# Patient Record
Sex: Male | Born: 1935 | Race: Black or African American | Hispanic: No | Marital: Married | Smoking: Never smoker
Health system: Southern US, Community
[De-identification: ages and names within clinical notes are randomized; demographics above are authoritative.]

## PROBLEM LIST (undated history)

## (undated) DIAGNOSIS — I10 Essential (primary) hypertension: Secondary | ICD-10-CM

## (undated) DIAGNOSIS — E7800 Pure hypercholesterolemia, unspecified: Secondary | ICD-10-CM

## (undated) DIAGNOSIS — E785 Hyperlipidemia, unspecified: Secondary | ICD-10-CM

## (undated) DIAGNOSIS — I82401 Acute embolism and thrombosis of unspecified deep veins of right lower extremity: Secondary | ICD-10-CM

## (undated) DIAGNOSIS — C801 Malignant (primary) neoplasm, unspecified: Secondary | ICD-10-CM

## (undated) HISTORY — PX: JOINT REPLACEMENT: SHX530

---

## 2001-05-18 ENCOUNTER — Ambulatory Visit (HOSPITAL_COMMUNITY): Admission: RE | Admit: 2001-05-18 | Discharge: 2001-05-18 | Payer: Self-pay | Admitting: Internal Medicine

## 2002-10-02 ENCOUNTER — Encounter: Admission: RE | Admit: 2002-10-02 | Discharge: 2002-10-02 | Payer: Self-pay | Admitting: Orthopedic Surgery

## 2002-10-02 ENCOUNTER — Encounter: Payer: Self-pay | Admitting: Orthopedic Surgery

## 2002-10-04 ENCOUNTER — Ambulatory Visit (HOSPITAL_BASED_OUTPATIENT_CLINIC_OR_DEPARTMENT_OTHER): Admission: RE | Admit: 2002-10-04 | Discharge: 2002-10-04 | Payer: Self-pay | Admitting: Orthopedic Surgery

## 2003-12-04 ENCOUNTER — Ambulatory Visit (HOSPITAL_COMMUNITY): Admission: RE | Admit: 2003-12-04 | Discharge: 2003-12-04 | Payer: Self-pay | Admitting: Gastroenterology

## 2003-12-04 ENCOUNTER — Encounter (INDEPENDENT_AMBULATORY_CARE_PROVIDER_SITE_OTHER): Payer: Self-pay | Admitting: Specialist

## 2006-02-02 ENCOUNTER — Encounter: Admission: RE | Admit: 2006-02-02 | Discharge: 2006-02-02 | Payer: Self-pay | Admitting: Sports Medicine

## 2006-06-13 ENCOUNTER — Emergency Department (HOSPITAL_COMMUNITY): Admission: EM | Admit: 2006-06-13 | Discharge: 2006-06-13 | Payer: Self-pay | Admitting: Emergency Medicine

## 2006-08-15 ENCOUNTER — Encounter: Admission: RE | Admit: 2006-08-15 | Discharge: 2006-08-15 | Payer: Self-pay | Admitting: Sports Medicine

## 2006-10-25 ENCOUNTER — Ambulatory Visit (HOSPITAL_COMMUNITY): Admission: RE | Admit: 2006-10-25 | Discharge: 2006-10-25 | Payer: Self-pay | Admitting: General Surgery

## 2006-10-31 ENCOUNTER — Emergency Department (HOSPITAL_COMMUNITY): Admission: EM | Admit: 2006-10-31 | Discharge: 2006-10-31 | Payer: Self-pay | Admitting: Emergency Medicine

## 2007-09-07 ENCOUNTER — Encounter: Admission: RE | Admit: 2007-09-07 | Discharge: 2007-09-07 | Payer: Self-pay | Admitting: Sports Medicine

## 2008-06-10 ENCOUNTER — Encounter: Admission: RE | Admit: 2008-06-10 | Discharge: 2008-06-10 | Payer: Self-pay | Admitting: Sports Medicine

## 2008-10-07 ENCOUNTER — Encounter: Admission: RE | Admit: 2008-10-07 | Discharge: 2008-10-07 | Payer: Self-pay | Admitting: Sports Medicine

## 2009-02-04 ENCOUNTER — Encounter: Admission: RE | Admit: 2009-02-04 | Discharge: 2009-02-04 | Payer: Self-pay | Admitting: Sports Medicine

## 2009-07-08 ENCOUNTER — Ambulatory Visit: Payer: Self-pay

## 2009-07-08 ENCOUNTER — Encounter: Payer: Self-pay | Admitting: Cardiovascular Disease

## 2009-07-08 DIAGNOSIS — Z8672 Personal history of thrombophlebitis: Secondary | ICD-10-CM

## 2009-07-16 ENCOUNTER — Inpatient Hospital Stay (HOSPITAL_COMMUNITY): Admission: RE | Admit: 2009-07-16 | Discharge: 2009-07-19 | Payer: Self-pay | Admitting: Orthopedic Surgery

## 2010-02-24 NOTE — Miscellaneous (Signed)
Summary: Orders Update  Clinical Lists Changes  Problems: Added new problem of DEEP VENOUS THROMBOPHLEBITIS, HX OF (ICD-V12.52) Orders: Added new Test order of Carotid Duplex (Carotid Duplex) - Signed Added new Test order of Venous Duplex Lower Extremity (Venous Duplex Lower) - Signed

## 2010-04-12 LAB — BASIC METABOLIC PANEL
BUN: 6 mg/dL (ref 6–23)
BUN: 7 mg/dL (ref 6–23)
BUN: 8 mg/dL (ref 6–23)
BUN: 8 mg/dL (ref 6–23)
CO2: 31 mEq/L (ref 19–32)
CO2: 31 mEq/L (ref 19–32)
CO2: 33 mEq/L — ABNORMAL HIGH (ref 19–32)
CO2: 33 mEq/L — ABNORMAL HIGH (ref 19–32)
Calcium: 8.4 mg/dL (ref 8.4–10.5)
Chloride: 95 mEq/L — ABNORMAL LOW (ref 96–112)
Chloride: 98 mEq/L (ref 96–112)
Chloride: 98 mEq/L (ref 96–112)
Creatinine, Ser: 0.93 mg/dL (ref 0.4–1.5)
Creatinine, Ser: 1.04 mg/dL (ref 0.4–1.5)
GFR calc Af Amer: >60 mL/min (ref >60)
GFR calc non Af Amer: >60 mL/min (ref >60)
GFR calc non Af Amer: >60 mL/min (ref >60)
Glucose, Bld: 125 mg/dL — ABNORMAL HIGH (ref 70–99)
Glucose, Bld: 138 mg/dL — ABNORMAL HIGH (ref 70–99)
Glucose, Bld: 142 mg/dL — ABNORMAL HIGH (ref 70–99)
Glucose, Bld: 166 mg/dL — ABNORMAL HIGH (ref 70–99)
Potassium: 2.9 mEq/L — ABNORMAL LOW (ref 3.5–5.1)
Potassium: 3 mEq/L — ABNORMAL LOW (ref 3.5–5.1)
Potassium: 3.1 mEq/L — ABNORMAL LOW (ref 3.5–5.1)
Sodium: 135 mEq/L (ref 135–145)
Sodium: 137 mEq/L (ref 135–145)
Sodium: 140 mEq/L (ref 135–145)

## 2010-04-12 LAB — COMPREHENSIVE METABOLIC PANEL
AST: 30 U/L (ref 0–37)
Albumin: 4.1 g/dL (ref 3.5–5.2)
Alkaline Phosphatase: 59 U/L (ref 39–117)
BUN: 15 mg/dL (ref 6–23)
CO2: 31 mEq/L (ref 19–32)
Creatinine, Ser: 0.98 mg/dL (ref 0.4–1.5)
GFR calc non Af Amer: >60 mL/min (ref >60)
Potassium: 4 mEq/L (ref 3.5–5.1)
Sodium: 140 mEq/L (ref 135–145)
Total Bilirubin: 0.6 mg/dL (ref 0.3–1.2)

## 2010-04-12 LAB — PROTIME-INR
INR: 1.03 (ref 0.00–1.49)
INR: 1.23 (ref 0.00–1.49)
INR: 1.49 (ref 0.00–1.49)
Prothrombin Time: 15.3 seconds — ABNORMAL HIGH (ref 11.6–15.2)
Prothrombin Time: 15.4 seconds — ABNORMAL HIGH (ref 11.6–15.2)

## 2010-04-12 LAB — CBC
HCT: 31.7 % — ABNORMAL LOW (ref 39.0–52.0)
HCT: 32.8 % — ABNORMAL LOW (ref 39.0–52.0)
HCT: 46.7 % (ref 39.0–52.0)
Hemoglobin: 10.5 g/dL — ABNORMAL LOW (ref 13.0–17.0)
Hemoglobin: 10.9 g/dL — ABNORMAL LOW (ref 13.0–17.0)
MCH: 28.8 pg (ref 26.0–34.0)
MCHC: 33.2 g/dL (ref 30.0–36.0)
MCHC: 33.3 g/dL (ref 30.0–36.0)
MCV: 86.4 fL (ref 78.0–100.0)
MCV: 87.2 fL (ref 78.0–100.0)
Platelets: 137 10*3/uL — ABNORMAL LOW (ref 150–400)
Platelets: 169 10*3/uL (ref 150–400)
RDW: 13.2 % (ref 11.5–15.5)
RDW: 13.5 % (ref 11.5–15.5)
RDW: 13.5 % (ref 11.5–15.5)
RDW: 13.5 % (ref 11.5–15.5)
WBC: 5.9 10*3/uL (ref 4.0–10.5)
WBC: 8.4 10*3/uL (ref 4.0–10.5)

## 2010-04-12 LAB — URINALYSIS, ROUTINE W REFLEX MICROSCOPIC
Glucose, UA: NEGATIVE mg/dL
Hgb urine dipstick: NEGATIVE
Protein, ur: NEGATIVE mg/dL
Urobilinogen, UA: 1 mg/dL (ref 0.0–1.0)

## 2010-04-12 LAB — TYPE AND SCREEN
ABO/RH(D): A NEG
Antibody Screen: NEGATIVE

## 2010-04-12 LAB — APTT: aPTT: 28 seconds (ref 24–37)

## 2010-06-09 NOTE — Procedures (Signed)
NAME:  Craig Freeman, Craig Freeman              ACCOUNT NO.:  [id number]   MEDICAL RECORD NO.:  [id number]          PATIENT TYPE:  INP   LOCATION:  5016                         FACILITY:  [facility]   PHYSICIAN:  Loreta Ave, M.D. DATE OF BIRTH:  1924-11-07   DATE OF PROCEDURE:  DATE OF DISCHARGE:                    PERIPHERAL VASCULAR INVASIVE PROCEDURE   FINAL DIAGNOSES:  1. 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  2. Hypertension.  3. Dyslipidemia.  4. History of chronic Coumadin due to previous deep venous thrombosis      and phlebitis.  5. Asthma.  6. Insomnia.   HISTORY OF PRESENT ILLNESS:  An 77 year old black male with history of  end-stage degenerative joint disease left hip and chronic pain presented  to our office for preop evaluation for total hip replacement.  He had  progressively worsening pain with failed response with conservative  treatment, significant decrease in his daily activities due to the  ongoing complaint.   HOSPITAL COURSE:  On July 16, 2009, the patient was taken to the [facility] OR and a left total hip replacement procedure performed.  Surgeon  Loreta Ave, M.D. and assistant Genene Churn. Denton Meek.  Anesthesia  general.  No specimens.  EBL minimal.  There were no surgical or  anesthesia complications and the patient was transferred to recovery in  stable condition.  Pharmacy protocol Coumadin started along with Lovenox  bridge.  On July 17, 2009, the patient doing well.  Temperature 99.5,  pulse 82, respirations 18, blood pressure 105/67.  WBC 8.4, hematocrit  38.4, hemoglobin 12.6, platelets 137, sodium 135, potassium 3.3,  chloride 95, CO2 31, BUN 8, creatinine 1.04, glucose 166, INR 1.23.  The  patient was alert and oriented.  Dressing clean, dry and intact.  Calf  nontender.  Neurovascularly intact.  Skin warm and dry.  Will have case  manager arrange skilled nursing facility placement since he does live  alone.  The patient is requesting [facility] and he did visit this  facility preop.  Discontinued Dilaudid IV.  PT/OT consults.  On July 18, 2009, the patient doing well.  Pain controlled.  Denies chest pain,  shortness of breath.  Temperature 98.6, pulse 75, respirations 20, blood  pressure 121/71.  WBC 10.9, hematocrit 32.8, hemoglobin 10.9, platelets  112, sodium 140, potassium 2.9, chloride 98, CO2 33, BUN 8, creatinine  0.93, glucose 125, INR 1.49.  Hip wound looks good.  Staples intact.  No  drainage or signs of infection.  Calf nontender.  Neurovascularly  intact.  Skin warm and dry.  For hypokalemia give KCl 40 mEq p.o. q.4  hours times 2 doses only.  Repeat BMET later today.  He is ready for  transfer to [facility] skilled nursing facility Saturday.   DISCHARGE MEDICATIONS:  1. Percocet 5/325 one or two tabs p.o. q.6-8 hours p.r.n. for pain.  2. Robaxin 500 mg one tab p.o. q.6 hours p.r.n. for spasms.  3. Coumadin pharmacy protocol.  Maintain INR 2 to 3.  4. Lovenox 40 mg one subcutaneous injection daily.  Discontinue when  Coumadin is therapeutic with INR 2 to 3.  5. Artificial Tears one drop each eye daily.  6. Proscar 5 mg p.o. daily.  7. Zocor 40 mg p.o. daily.  8. Miconazole 2% cream apply b.i.d. to affected area.  9. Advair 250/50 one puff daily.  10.Colace 100 mg p.o. b.i.d.  11.Tenormin 50 mg p.o. daily.  12.HCTZ 25 mg p.o. daily.  13.Triamcinolone acetonide 0.1% cream apply b.i.d. p.r.n. to affected      area.  14.Albuterol 90 mcg inhaler one puff q.6 hours p.r.n.  15.MiraLAX 17 g powder p.o. q.h.s. p.r.n.   DISPOSITION:  Transfer to [facility] skilled nursing facility.   CONDITION:  Stable.   DISCHARGE INSTRUCTIONS:  While at the facility the patient will continue  to work with PT/OT for hip range of motion and strengthening per  protocol.  He is weightbearing as tolerated with walker.  Daily dressing  changes with 4x4s, gauze and tape.  Do not  apply any creams or ointments  to his incision.  Staples to be removed 2 weeks postop and this can be  done in our office at followup.  He is okay to shower but no tub  soaking.  Discontinue Lovenox injections when Coumadin is therapeutic  with INR 2 to 3.  He will follow up in the office with Dr. Eulah Pont when  he is 2 weeks postop for recheck.  Call us immediately if there are any  questions or concerns before that time at area code [phone].      Genene Churn. Denton Meek.    ______________________________  Loreta Ave, M.D.    JMO/MEDQ  D:  07/18/2009  T:  07/18/2009  Job:  [id number]

## 2010-06-09 NOTE — Op Note (Signed)
NAME:  Craig Freeman, Craig Freeman              ACCOUNT NO.:  [id number]   MEDICAL RECORD NO.:  [id number]          PATIENT TYPE:  AMB   LOCATION:  SDS                          FACILITY:  [facility]   PHYSICIAN:  Adolph Pollack, M.D.DATE OF BIRTH:  07/06/1924   DATE OF PROCEDURE:  10/25/2006  DATE OF DISCHARGE:                               OPERATIVE REPORT   PREOPERATIVE DIAGNOSIS:  Right inguinal hernia.   POSTOPERATIVE DIAGNOSIS:  Indirect right inguinal hernia.   PROCEDURE:  Right inguinal hernia repair with mesh.   SURGEON:  Adolph Pollack, M.D.   ANESTHESIA:  General by way of LMA plus local (Marcaine).   INDICATIONS:  This is an 77 year old male who suffered a right groin  strain earlier this year.  I saw him in the office and he slowly  improved from this.  Recently, however, he presented back saying he has  now noted a swelling and heard a gurgling sound.  On examination he  definitely now has developed a right inguinal hernia and now he presents  for repair.  We discussed the procedure and the risks preoperatively.   TECHNIQUE:  He was seen in the holding area and the right groin marked  with my initials.  He was then brought to the operating room, placed on  the operating table, and a general anesthetic was administered.  The  right groin area was sterilely prepped and draped.  Dilute Marcaine  solution was infiltrated into the right groin.  The right groin incision  was made through the skin, subcutaneous tissue and Scarpa fascia until  the external oblique aponeurosis was exposed.  Local anesthetic was  infiltrated deep to the external oblique aponeurosis.  An incision was  made in the external oblique aponeurosis through the external ring  medially and up toward the anterior superior iliac spine laterally.  Using blunt dissection, the shelving edge of the inguinal ligament was  exposed inferiorly and then the internal oblique muscle and aponeurosis  were exposed superiorly.   I isolated the spermatic cord and created a  window around it and placed a Penrose drain around it.  Posterior  dissection freeing up the cord was performed.  An indirect sac was noted  and this was stripped free from the cord and reduced back through a  dilated internal ring.  Following this a piece of 3 x 6 inches  polypropylene mesh was brought into the field and anchored 2 cm medial  to the pubic tubercle with 2-0 Prolene suture.  The inferior aspect of  the mesh was then anchored to the shelving edge of the inguinal ligament  with a running 2-0 Prolene suture up to a level 2 cm lateral to the  internal ring.  A slit was cut in the mesh creating two tails and these  were wrapped around the spermatic cord.  The superior aspect of the mesh  was anchored to the internal oblique aponeurosis with interrupted 2-0  Vicryl sutures.  The two tails were then crossed creating a new internal  ring.  They were then anchored to the shelving edge of the inguinal  ligament with a single 2-0 Prolene suture.  The tip of the hemostat was  able to be placed through the new aperture.   Following this I inspected the area and hemostasis was adequate.  I then  closed the external oblique aponeurosis over the mesh and cord with a  running 3-0 Vicryl suture.  Scarpa fascia was closed with a running 2-0  Vicryl suture.  Skin was approximated with a 4-0 Monocryl subcuticular  stitch, followed by Steri-Strips and sterile dressings.   He tolerated the procedure well without apparent complications and was  taken to the recovery room in satisfactory condition.      Adolph Pollack, M.D.  Electronically Signed     TJR/MEDQ  D:  10/25/2006  T:  10/25/2006  Job:  [id number]

## 2010-06-12 NOTE — Op Note (Signed)
NAME:  Craig Freeman, Craig Freeman                        ACCOUNT NO.:  [id number]   MEDICAL RECORD NO.:  [id number]                   PATIENT TYPE:  AMB   LOCATION:  DSC                                  FACILITY:  [facility]   PHYSICIAN:  Loreta Ave, M.D.              DATE OF BIRTH:  April 30, 1924   DATE OF PROCEDURE:  10/04/2002  DATE OF DISCHARGE:                                 OPERATIVE REPORT   PREOPERATIVE DIAGNOSES:  Degenerative arthritis, medial meniscus tear, right  knee.   POSTOPERATIVE DIAGNOSES:  1. Degenerative arthritis, grade 3 diffuse, with grade 3 chondromalacia of     the patella.  2. Medial and lateral meniscus tear.   PROCEDURES:  1. Right knee examination under anesthesia, arthroscopy, partial medial and     lateral meniscectomy.  2. Chondroplasty of the patellofemoral joint.  3. Chondroplasty of the medial and lateral compartments.   SURGEON:  Loreta Ave, M.D.   ASSISTANT:  Arlys John D. Petrarca, P.A.-C.   ANESTHESIA:  General.   ESTIMATED BLOOD LOSS:  Minimal.   SPECIMENS:  None.   CULTURES:  None.   COMPLICATIONS:  None.   TOURNIQUET:  Not employed.   DESCRIPTION OF PROCEDURE:  The patient brought to the operating room and  placed on the operating table in the supine position.  After adequate  anesthesia had been obtained, right knee examined.  Full motion, good  stability, positive medial McMurray.  Tourniquet and leg holder applied.  Leg prepped and draped in the usual sterile fashion.  Three portals created,  one superolateral, one each medial and lateral peripatellar.  Inflow  catheter introduced, knee distended, arthroscope introduced, knee inspected.  Grade 3 changes of the patellofemoral joint, treated with chondroplasty.  Smooth, stable surface.  Good patellofemoral tracking.  Extensive tearing,  medial meniscus posterior third, taken down to stable rim.  Tapered into  remaining meniscus, salving the anterior half.  The lateral half had  frayed  flap tears of the middle and anterior third, both treated with resection,  tapered in smoothly, retaining a fair amount of meniscus.  Cruciate  ligaments intact.  Diffuse grade 3 changes in the medial compartment,  treated with chondroplasty.  Some focal grade 3 changes, lateral femoral  condyle, also treated with chondroplasty.  At  completion, the entire knee examined.  All surfaces even.  Instruments and  fluid removed.  Portals and knee injected with Marcaine.  Portals closed  with 4-0 nylon.  A sterile compressive dressing applied.  Anesthesia  reversed.  Brought to the recovery room.  Tolerated the surgery well with no  complications.                                               Loreta Ave, M.D.    DFM/MEDQ  D:  10/04/2002  T:  10/05/2002  Job:  [id number]

## 2010-06-12 NOTE — Op Note (Signed)
NAME:  Craig Freeman, Craig Freeman              ACCOUNT NO.:  [id number]   MEDICAL RECORD NO.:  [id number]          PATIENT TYPE:  AMB   LOCATION:  ENDO                         FACILITY:  [facility]   PHYSICIAN:  Danise Edge, M.D.   DATE OF BIRTH:  01-27-1924   DATE OF PROCEDURE:  12/04/2003  DATE OF DISCHARGE:                                 OPERATIVE REPORT   PROCEDURE:  Colonoscopy.   SURGEON:  Danise Edge, M.D.   PREMEDICATION:  Versed 4 mg and Demerol 30 mg.   INDICATIONS FOR PROCEDURE:  Craig Freeman is a 77 year old male born  [birth year].  Craig Freeman has never undergone colorectal polyp  screening.  He is scheduled to undergo his first screening colonoscopy with  polypectomy to prevent colon cancer to date.  He chronically takes Coumadin  to prevent recurrent phlebitis.  He stopped his Coumadin seven days ago and  is currently on parenteral Lovenox.   DESCRIPTION OF PROCEDURE:  After obtaining informed consent, Craig Freeman was  placed in the left lateral decubitus position.  I administered intravenous  Demerol and intravenous Versed to achieve conscious sedation for the  procedure.  The patient's blood pressure, oxygen saturation and cardiac  rhythm were monitored throughout the procedure and documented in the medical  record.   Anal inspection and digital rectal examination were normal.  The prostate  was not nodular.  The Olympus adjustable pediatric colonoscope was  introduced into the rectum and advanced to the cecum.  Colonic preparation  examination today was excellent.   Craig Freeman has universal colonic diverticulosis without diverticulitis or  diverticular stricture formation.   Rectum normal.  Sigmoid colon and descending colon:  At approximately 50 cm  from the anal verge, a 3 mm sessile polyp was removed with electrocautery  snare and an Endoclip applied to the polypectomy site.  A 1 mm sessile polyp  was removed with cold biopsy forceps.  Splenic flexure  normal.  Transverse  colon normal.  Hepatic flexure normal.  Ascending colon, cecum and ileocecal  valve:  From the proximal cecum, a 1 mm sessile polyp was removed with a  cold biopsy forceps.  From the proximal ascending colon, a 1 mm sessile  polyp was removed with the electrocautery snare and an Endoclip applied to  the polypectomy site.   ASSESSMENT:  1.  Universal diverticulosis.  2.  A dimunitive polyp was removed from the proximal cecum and a 2 mm polyp      was removed from the ascending colon.  3.  At 50 cm from the anal verge, a 3 mm polyp was removed with      electrocautery snare and a 1 mm polyp      removed with cold biopsy forceps.  Endoclips were applied at the      polypectomy site of the two polyps I removed with the electrocautery      snare.   RECOMMENDATIONS:  Craig Freeman will resume taking his Coumadin today.      MJ/MEDQ  D:  12/04/2003  T:  12/04/2003  Job:  [id number]   cc:  Tomasa Hose  [address] [place]  [place] [address]  Fax: [phone]

## 2010-11-05 LAB — DIFFERENTIAL
Basophils Absolute: 0
Basophils Absolute: 0
Basophils Relative: 0
Basophils Relative: 0
Eosinophils Absolute: 0.1
Eosinophils Relative: 1
Monocytes Absolute: 0.6
Monocytes Absolute: 0.5
Neutro Abs: 8.8 — ABNORMAL HIGH
Neutro Abs: 3.3
Neutrophils Relative %: 61

## 2010-11-05 LAB — COMPREHENSIVE METABOLIC PANEL
Alkaline Phosphatase: 51
BUN: 14
Chloride: 97
Glucose, Bld: 96
Potassium: 3.8
Total Bilirubin: 1.2

## 2010-11-05 LAB — URINE CULTURE

## 2010-11-05 LAB — CBC
HCT: 47.3
Hemoglobin: 14.2
Hemoglobin: 15.6
MCHC: 33.2
RBC: 5.63
RDW: 14.3 — ABNORMAL HIGH
WBC: 5.5

## 2010-11-05 LAB — URINALYSIS, ROUTINE W REFLEX MICROSCOPIC
Bilirubin Urine: NEGATIVE
Glucose, UA: NEGATIVE
Specific Gravity, Urine: 1.02
pH: 7

## 2010-11-05 LAB — PROTIME-INR: INR: 2.2 — ABNORMAL HIGH

## 2010-11-05 LAB — CULTURE, BLOOD (ROUTINE X 2)
Culture: NO GROWTH
Culture: NO GROWTH

## 2010-11-05 LAB — I-STAT 8, (EC8 V) (CONVERTED LAB)
Chloride: 101
Glucose, Bld: 120 — ABNORMAL HIGH
Potassium: 3.4 — ABNORMAL LOW
pH, Ven: 7.486 — ABNORMAL HIGH

## 2010-11-05 LAB — URINE MICROSCOPIC-ADD ON

## 2010-11-05 LAB — POCT I-STAT CREATININE: Operator id: 294511

## 2011-01-29 DIAGNOSIS — Z7901 Long term (current) use of anticoagulants: Secondary | ICD-10-CM | POA: Diagnosis not present

## 2011-02-09 DIAGNOSIS — L03119 Cellulitis of unspecified part of limb: Secondary | ICD-10-CM | POA: Diagnosis not present

## 2011-02-09 DIAGNOSIS — L02419 Cutaneous abscess of limb, unspecified: Secondary | ICD-10-CM | POA: Diagnosis not present

## 2011-02-26 DIAGNOSIS — Z7901 Long term (current) use of anticoagulants: Secondary | ICD-10-CM | POA: Diagnosis not present

## 2011-03-09 DIAGNOSIS — H40019 Open angle with borderline findings, low risk, unspecified eye: Secondary | ICD-10-CM | POA: Diagnosis not present

## 2011-03-11 DIAGNOSIS — E782 Mixed hyperlipidemia: Secondary | ICD-10-CM | POA: Diagnosis not present

## 2011-03-11 DIAGNOSIS — K219 Gastro-esophageal reflux disease without esophagitis: Secondary | ICD-10-CM | POA: Diagnosis not present

## 2011-03-11 DIAGNOSIS — R7309 Other abnormal glucose: Secondary | ICD-10-CM | POA: Diagnosis not present

## 2011-03-11 DIAGNOSIS — J45909 Unspecified asthma, uncomplicated: Secondary | ICD-10-CM | POA: Diagnosis not present

## 2011-03-11 DIAGNOSIS — K5900 Constipation, unspecified: Secondary | ICD-10-CM | POA: Diagnosis not present

## 2011-03-11 DIAGNOSIS — Z23 Encounter for immunization: Secondary | ICD-10-CM | POA: Diagnosis not present

## 2011-03-11 DIAGNOSIS — N400 Enlarged prostate without lower urinary tract symptoms: Secondary | ICD-10-CM | POA: Diagnosis not present

## 2011-03-11 DIAGNOSIS — I10 Essential (primary) hypertension: Secondary | ICD-10-CM | POA: Diagnosis not present

## 2011-03-11 DIAGNOSIS — Z1331 Encounter for screening for depression: Secondary | ICD-10-CM | POA: Diagnosis not present

## 2011-03-26 DIAGNOSIS — Z7901 Long term (current) use of anticoagulants: Secondary | ICD-10-CM | POA: Diagnosis not present

## 2011-04-26 DIAGNOSIS — Z7901 Long term (current) use of anticoagulants: Secondary | ICD-10-CM | POA: Diagnosis not present

## 2011-04-29 DIAGNOSIS — R6883 Chills (without fever): Secondary | ICD-10-CM | POA: Diagnosis not present

## 2011-05-10 DIAGNOSIS — M25559 Pain in unspecified hip: Secondary | ICD-10-CM | POA: Diagnosis not present

## 2011-05-10 DIAGNOSIS — M47817 Spondylosis without myelopathy or radiculopathy, lumbosacral region: Secondary | ICD-10-CM | POA: Diagnosis not present

## 2011-05-24 DIAGNOSIS — N4889 Other specified disorders of penis: Secondary | ICD-10-CM | POA: Diagnosis not present

## 2011-05-24 DIAGNOSIS — R319 Hematuria, unspecified: Secondary | ICD-10-CM | POA: Diagnosis not present

## 2011-05-24 DIAGNOSIS — Z7901 Long term (current) use of anticoagulants: Secondary | ICD-10-CM | POA: Diagnosis not present

## 2011-06-13 ENCOUNTER — Encounter (HOSPITAL_COMMUNITY): Payer: Self-pay

## 2011-06-13 ENCOUNTER — Emergency Department (HOSPITAL_COMMUNITY): Payer: Medicare Other

## 2011-06-13 ENCOUNTER — Inpatient Hospital Stay (HOSPITAL_COMMUNITY)
Admission: EM | Admit: 2011-06-13 | Discharge: 2011-06-16 | DRG: 603 | Disposition: A | Discharge status: Home Health | Payer: Medicare Other | Attending: Internal Medicine | Admitting: Internal Medicine

## 2011-06-13 DIAGNOSIS — L0291 Cutaneous abscess, unspecified: Secondary | ICD-10-CM

## 2011-06-13 DIAGNOSIS — R5381 Other malaise: Secondary | ICD-10-CM | POA: Diagnosis not present

## 2011-06-13 DIAGNOSIS — Z8672 Personal history of thrombophlebitis: Secondary | ICD-10-CM

## 2011-06-13 DIAGNOSIS — Z9660 Presence of unspecified orthopedic joint implant: Secondary | ICD-10-CM

## 2011-06-13 DIAGNOSIS — I10 Essential (primary) hypertension: Secondary | ICD-10-CM | POA: Diagnosis not present

## 2011-06-13 DIAGNOSIS — E785 Hyperlipidemia, unspecified: Secondary | ICD-10-CM | POA: Diagnosis present

## 2011-06-13 DIAGNOSIS — L0231 Cutaneous abscess of buttock: Secondary | ICD-10-CM | POA: Diagnosis not present

## 2011-06-13 DIAGNOSIS — K219 Gastro-esophageal reflux disease without esophagitis: Secondary | ICD-10-CM | POA: Diagnosis present

## 2011-06-13 DIAGNOSIS — Z7901 Long term (current) use of anticoagulants: Secondary | ICD-10-CM

## 2011-06-13 DIAGNOSIS — J45909 Unspecified asthma, uncomplicated: Secondary | ICD-10-CM | POA: Diagnosis present

## 2011-06-13 DIAGNOSIS — R509 Fever, unspecified: Secondary | ICD-10-CM

## 2011-06-13 DIAGNOSIS — L0390 Cellulitis, unspecified: Secondary | ICD-10-CM

## 2011-06-13 DIAGNOSIS — I809 Phlebitis and thrombophlebitis of unspecified site: Secondary | ICD-10-CM | POA: Diagnosis present

## 2011-06-13 DIAGNOSIS — R5383 Other fatigue: Secondary | ICD-10-CM | POA: Diagnosis not present

## 2011-06-13 DIAGNOSIS — E876 Hypokalemia: Secondary | ICD-10-CM | POA: Diagnosis present

## 2011-06-13 DIAGNOSIS — Z79899 Other long term (current) drug therapy: Secondary | ICD-10-CM

## 2011-06-13 DIAGNOSIS — L03317 Cellulitis of buttock: Secondary | ICD-10-CM | POA: Diagnosis not present

## 2011-06-13 DIAGNOSIS — G4700 Insomnia, unspecified: Secondary | ICD-10-CM | POA: Diagnosis present

## 2011-06-13 DIAGNOSIS — N400 Enlarged prostate without lower urinary tract symptoms: Secondary | ICD-10-CM | POA: Diagnosis present

## 2011-06-13 DIAGNOSIS — R319 Hematuria, unspecified: Secondary | ICD-10-CM | POA: Diagnosis present

## 2011-06-13 DIAGNOSIS — I82401 Acute embolism and thrombosis of unspecified deep veins of right lower extremity: Secondary | ICD-10-CM

## 2011-06-13 HISTORY — DX: Essential (primary) hypertension: I10

## 2011-06-13 HISTORY — DX: Hyperlipidemia, unspecified: E78.5

## 2011-06-13 HISTORY — DX: Acute embolism and thrombosis of unspecified deep veins of right lower extremity: I82.401

## 2011-06-13 LAB — URINALYSIS, ROUTINE W REFLEX MICROSCOPIC
Bilirubin Urine: NEGATIVE
Ketones, ur: NEGATIVE mg/dL
Nitrite: NEGATIVE
Specific Gravity, Urine: 1.023 (ref 1.005–1.030)
Urobilinogen, UA: 1 mg/dL (ref 0.0–1.0)

## 2011-06-13 LAB — POCT I-STAT, CHEM 8
Calcium, Ion: 1.14 mmol/L (ref 1.12–1.32)
Glucose, Bld: 127 mg/dL — ABNORMAL HIGH (ref 70–99)
HCT: 48 % (ref 39.0–52.0)
Hemoglobin: 16.3 g/dL (ref 13.0–17.0)

## 2011-06-13 MED ORDER — VANCOMYCIN HCL IN DEXTROSE 1-5 GM/200ML-% IV SOLN
1000.0000 mg | Freq: Once | INTRAVENOUS | Status: AC
  Administered 2011-06-13: 1000 mg via INTRAVENOUS
  Filled 2011-06-13: qty 200

## 2011-06-13 MED ORDER — ACETAMINOPHEN 325 MG PO TABS
650.0000 mg | ORAL_TABLET | Freq: Once | ORAL | Status: AC
  Administered 2011-06-13: 650 mg via ORAL
  Filled 2011-06-13: qty 2

## 2011-06-13 MED ORDER — SODIUM CHLORIDE 0.9 % IV SOLN
INTRAVENOUS | Status: DC
  Administered 2011-06-13: via INTRAVENOUS

## 2011-06-13 NOTE — ED Notes (Signed)
Pt presents with no acute distress. ?abscess to left buttocks, fever today 104 earlier- tylenol taken at 9pm.  Blood in urine today

## 2011-06-14 ENCOUNTER — Encounter (HOSPITAL_COMMUNITY): Payer: Self-pay | Admitting: Internal Medicine

## 2011-06-14 DIAGNOSIS — R509 Fever, unspecified: Secondary | ICD-10-CM | POA: Diagnosis not present

## 2011-06-14 DIAGNOSIS — Z7901 Long term (current) use of anticoagulants: Secondary | ICD-10-CM | POA: Diagnosis not present

## 2011-06-14 DIAGNOSIS — I809 Phlebitis and thrombophlebitis of unspecified site: Secondary | ICD-10-CM | POA: Diagnosis present

## 2011-06-14 DIAGNOSIS — Z9660 Presence of unspecified orthopedic joint implant: Secondary | ICD-10-CM | POA: Diagnosis not present

## 2011-06-14 DIAGNOSIS — K612 Anorectal abscess: Secondary | ICD-10-CM

## 2011-06-14 DIAGNOSIS — Z79899 Other long term (current) drug therapy: Secondary | ICD-10-CM | POA: Diagnosis not present

## 2011-06-14 DIAGNOSIS — G4700 Insomnia, unspecified: Secondary | ICD-10-CM | POA: Diagnosis present

## 2011-06-14 DIAGNOSIS — E876 Hypokalemia: Secondary | ICD-10-CM | POA: Diagnosis present

## 2011-06-14 DIAGNOSIS — I10 Essential (primary) hypertension: Secondary | ICD-10-CM | POA: Diagnosis present

## 2011-06-14 DIAGNOSIS — K219 Gastro-esophageal reflux disease without esophagitis: Secondary | ICD-10-CM | POA: Diagnosis present

## 2011-06-14 DIAGNOSIS — E785 Hyperlipidemia, unspecified: Secondary | ICD-10-CM | POA: Diagnosis present

## 2011-06-14 DIAGNOSIS — J45909 Unspecified asthma, uncomplicated: Secondary | ICD-10-CM | POA: Diagnosis not present

## 2011-06-14 DIAGNOSIS — L03317 Cellulitis of buttock: Secondary | ICD-10-CM | POA: Diagnosis not present

## 2011-06-14 DIAGNOSIS — N400 Enlarged prostate without lower urinary tract symptoms: Secondary | ICD-10-CM | POA: Diagnosis present

## 2011-06-14 DIAGNOSIS — R319 Hematuria, unspecified: Secondary | ICD-10-CM | POA: Diagnosis present

## 2011-06-14 DIAGNOSIS — R5381 Other malaise: Secondary | ICD-10-CM | POA: Diagnosis not present

## 2011-06-14 DIAGNOSIS — L0291 Cutaneous abscess, unspecified: Secondary | ICD-10-CM | POA: Diagnosis present

## 2011-06-14 DIAGNOSIS — G4730 Sleep apnea, unspecified: Secondary | ICD-10-CM | POA: Diagnosis not present

## 2011-06-14 DIAGNOSIS — L0231 Cutaneous abscess of buttock: Secondary | ICD-10-CM | POA: Diagnosis present

## 2011-06-14 LAB — COMPREHENSIVE METABOLIC PANEL
ALT: 33 U/L (ref 0–53)
Alkaline Phosphatase: 55 U/L (ref 39–117)
BUN: 19 mg/dL (ref 6–23)
CO2: 29 mEq/L (ref 19–32)
Calcium: 9.3 mg/dL (ref 8.4–10.5)
GFR calc Af Amer: 73 mL/min — ABNORMAL LOW (ref >90)
GFR calc non Af Amer: 63 mL/min — ABNORMAL LOW (ref >90)
Glucose, Bld: 118 mg/dL — ABNORMAL HIGH (ref 70–99)
Sodium: 137 mEq/L (ref 135–145)

## 2011-06-14 LAB — CBC
HCT: 45.9 % (ref 39.0–52.0)
Hemoglobin: 15.3 g/dL (ref 13.0–17.0)
MCH: 28.2 pg (ref 26.0–34.0)
RBC: 5.43 MIL/uL (ref 4.22–5.81)

## 2011-06-14 LAB — PROTIME-INR
Prothrombin Time: 24.7 seconds — ABNORMAL HIGH (ref 11.6–15.2)
Prothrombin Time: 26.7 seconds — ABNORMAL HIGH (ref 11.6–15.2)

## 2011-06-14 MED ORDER — PIPERACILLIN-TAZOBACTAM 3.375 G IVPB
3.3750 g | Freq: Three times a day (TID) | INTRAVENOUS | Status: DC
  Administered 2011-06-14 – 2011-06-16 (×6): 3.375 g via INTRAVENOUS
  Filled 2011-06-14 (×7): qty 50

## 2011-06-14 MED ORDER — WARFARIN - PHARMACIST DOSING INPATIENT: Freq: Every day | Status: DC

## 2011-06-14 MED ORDER — ATENOLOL-CHLORTHALIDONE 50-25 MG PO TABS: 1.0000 | ORAL_TABLET | Freq: Every day | ORAL | Status: DC

## 2011-06-14 MED ORDER — ALBUTEROL SULFATE HFA 108 (90 BASE) MCG/ACT IN AERS
2.0000 | INHALATION_SPRAY | Freq: Four times a day (QID) | RESPIRATORY_TRACT | Status: DC | PRN
  Filled 2011-06-14: qty 6.7

## 2011-06-14 MED ORDER — WARFARIN SODIUM 2.5 MG PO TABS
2.5000 mg | ORAL_TABLET | Freq: Once | ORAL | Status: DC
  Filled 2011-06-14: qty 1

## 2011-06-14 MED ORDER — FLUTICASONE PROPIONATE 50 MCG/ACT NA SUSP
1.0000 | Freq: Every day | NASAL | Status: DC
  Administered 2011-06-14 – 2011-06-16 (×3): 1 via NASAL
  Filled 2011-06-14: qty 16

## 2011-06-14 MED ORDER — FLUTICASONE-SALMETEROL 100-50 MCG/DOSE IN AEPB
1.0000 | INHALATION_SPRAY | Freq: Two times a day (BID) | RESPIRATORY_TRACT | Status: DC
  Administered 2011-06-14 – 2011-06-16 (×5): 1 via RESPIRATORY_TRACT
  Filled 2011-06-14: qty 14

## 2011-06-14 MED ORDER — FERROUS SULFATE 325 (65 FE) MG PO TABS
325.0000 mg | ORAL_TABLET | Freq: Every day | ORAL | Status: DC
  Administered 2011-06-14 – 2011-06-16 (×2): 325 mg via ORAL
  Filled 2011-06-14 (×4): qty 1

## 2011-06-14 MED ORDER — SODIUM CHLORIDE 0.9 % IJ SOLN: 3.0000 mL | INTRAMUSCULAR | Status: DC | PRN

## 2011-06-14 MED ORDER — POTASSIUM CHLORIDE 20 MEQ/15ML (10%) PO LIQD
40.0000 meq | Freq: Once | ORAL | Status: AC
  Administered 2011-06-14: 40 meq via ORAL
  Filled 2011-06-14: qty 30

## 2011-06-14 MED ORDER — PHYTONADIONE 5 MG PO TABS
2.5000 mg | ORAL_TABLET | Freq: Once | ORAL | Status: AC
  Administered 2011-06-14: 2.5 mg via ORAL
  Filled 2011-06-14: qty 1

## 2011-06-14 MED ORDER — TRAMADOL HCL 50 MG PO TABS: 50.0000 mg | ORAL_TABLET | Freq: Three times a day (TID) | ORAL | Status: DC | PRN

## 2011-06-14 MED ORDER — LIDOCAINE-EPINEPHRINE (PF) 1 %-1:200000 IJ SOLN
INTRAMUSCULAR | Status: AC
  Administered 2011-06-14: 02:00:00
  Filled 2011-06-14: qty 10

## 2011-06-14 MED ORDER — FINASTERIDE 5 MG PO TABS
5.0000 mg | ORAL_TABLET | Freq: Every day | ORAL | Status: DC
  Administered 2011-06-14 – 2011-06-16 (×3): 5 mg via ORAL
  Filled 2011-06-14 (×3): qty 1

## 2011-06-14 MED ORDER — ONDANSETRON HCL 4 MG PO TABS: 4.0000 mg | ORAL_TABLET | Freq: Four times a day (QID) | ORAL | Status: DC | PRN

## 2011-06-14 MED ORDER — SENNA 8.6 MG PO TABS
1.0000 | ORAL_TABLET | Freq: Two times a day (BID) | ORAL | Status: DC
  Administered 2011-06-14 – 2011-06-16 (×5): 8.6 mg via ORAL
  Filled 2011-06-14 (×5): qty 1

## 2011-06-14 MED ORDER — SENNOSIDES 8.6 MG PO TABS: 1.0000 | ORAL_TABLET | Freq: Every day | ORAL | Status: DC

## 2011-06-14 MED ORDER — DOCUSATE SODIUM 100 MG PO CAPS
100.0000 mg | ORAL_CAPSULE | Freq: Two times a day (BID) | ORAL | Status: DC
  Administered 2011-06-14 – 2011-06-16 (×5): 100 mg via ORAL
  Filled 2011-06-14 (×6): qty 1

## 2011-06-14 MED ORDER — DICLOFENAC SODIUM 1 % TD GEL
1.0000 "application " | Freq: Four times a day (QID) | TRANSDERMAL | Status: DC
  Administered 2011-06-14 – 2011-06-16 (×8): 1 via TOPICAL
  Filled 2011-06-14: qty 100

## 2011-06-14 MED ORDER — SODIUM CHLORIDE 0.9 % IV SOLN: 250.0000 mL | INTRAVENOUS | Status: DC | PRN

## 2011-06-14 MED ORDER — POTASSIUM CHLORIDE ER 10 MEQ PO TBCR
10.0000 meq | EXTENDED_RELEASE_TABLET | Freq: Every day | ORAL | Status: DC
  Filled 2011-06-14: qty 1

## 2011-06-14 MED ORDER — WARFARIN SODIUM 5 MG PO TABS
5.0000 mg | ORAL_TABLET | Freq: Every day | ORAL | Status: DC
  Filled 2011-06-14: qty 1

## 2011-06-14 MED ORDER — SODIUM CHLORIDE 0.9 % IJ SOLN
3.0000 mL | Freq: Two times a day (BID) | INTRAMUSCULAR | Status: DC
  Administered 2011-06-14 – 2011-06-16 (×4): 3 mL via INTRAVENOUS

## 2011-06-14 MED ORDER — ATENOLOL 50 MG PO TABS
50.0000 mg | ORAL_TABLET | Freq: Every day | ORAL | Status: DC
  Administered 2011-06-14 – 2011-06-16 (×3): 50 mg via ORAL
  Filled 2011-06-14 (×3): qty 1

## 2011-06-14 MED ORDER — TRIAZOLAM 0.125 MG PO TABS
0.1250 mg | ORAL_TABLET | Freq: Every evening | ORAL | Status: DC | PRN
  Administered 2011-06-14 – 2011-06-15 (×2): 0.125 mg via ORAL
  Filled 2011-06-14 (×2): qty 1

## 2011-06-14 MED ORDER — ACETAMINOPHEN 500 MG PO TABS: 500.0000 mg | ORAL_TABLET | Freq: Four times a day (QID) | ORAL | Status: DC | PRN

## 2011-06-14 MED ORDER — DOCUSATE SODIUM 100 MG PO CAPS: 100.0000 mg | ORAL_CAPSULE | Freq: Two times a day (BID) | ORAL | Status: DC

## 2011-06-14 MED ORDER — POLYETHYLENE GLYCOL 3350 17 G PO PACK
17.0000 g | PACK | Freq: Two times a day (BID) | ORAL | Status: DC
  Administered 2011-06-14 – 2011-06-16 (×5): 17 g via ORAL
  Filled 2011-06-14 (×6): qty 1

## 2011-06-14 MED ORDER — VANCOMYCIN HCL 500 MG IV SOLR
500.0000 mg | Freq: Two times a day (BID) | INTRAVENOUS | Status: DC
  Administered 2011-06-14 – 2011-06-15 (×4): 500 mg via INTRAVENOUS
  Filled 2011-06-14 (×5): qty 500

## 2011-06-14 MED ORDER — ONDANSETRON HCL 4 MG/2ML IJ SOLN: 4.0000 mg | Freq: Four times a day (QID) | INTRAMUSCULAR | Status: DC | PRN

## 2011-06-14 MED ORDER — CHLORTHALIDONE 25 MG PO TABS
25.0000 mg | ORAL_TABLET | Freq: Every day | ORAL | Status: DC
  Administered 2011-06-14: 25 mg via ORAL
  Filled 2011-06-14 (×2): qty 1

## 2011-06-14 MED ORDER — ATORVASTATIN CALCIUM 40 MG PO TABS
40.0000 mg | ORAL_TABLET | Freq: Every day | ORAL | Status: DC
  Administered 2011-06-14 – 2011-06-16 (×3): 40 mg via ORAL
  Filled 2011-06-14 (×3): qty 1

## 2011-06-14 MED ORDER — POTASSIUM CHLORIDE ER 10 MEQ PO TBCR
10.0000 meq | EXTENDED_RELEASE_TABLET | Freq: Two times a day (BID) | ORAL | Status: DC
  Administered 2011-06-14 (×2): 10 meq via ORAL
  Filled 2011-06-14 (×4): qty 1

## 2011-06-14 NOTE — H&P (Signed)
PCP:  Georgann Housekeeper, MD, MD   Confirmed with pt  Chief Complaint:  Fever, abscess  HPI: 87yoM with h/o DVT on chronic coumadin, asthma, HTN, HL presents with  fever, left buttock abscess  Pt is reliable historian, states he was in usual state of health until he spiked a fever to 104 today, and also notes a bump on his buttock that he noted last week and he picked at. He was also concerned about some blood he saw in his urine today.   In the ED, vitals were stable. Labs with hypoK 3.1, otherwise normal  chem, LFT's, CBC. INR 2.19. UA negative. CXR with bronchitis but no  focal consolidation. The abscess was lanced and drained in the ED, pt  was given 1g Vanco, 40 mEq CKL PO, 650 tylenol. Admission requested for  observation given fever and advanced age.   ROS as above, otherwise negative.     Past Medical History  Diagnosis Date  . Hypertension   . Hyperlipidemia   . DVT (deep venous thrombosis), right     coumadin  . Constipation     Past Surgical History  Procedure Date  . Joint replacement     Medications:  HOME MEDS: Reconciled from a list they had  Prior to Admission medications   Medication Sig Start Date End Date Taking? Authorizing Provider  acetaminophen (TYLENOL) 500 MG tablet Take 500 mg by mouth every 6 (six) hours as needed. Fever   Yes Historical Provider, MD  albuterol (PROVENTIL HFA;VENTOLIN HFA) 108 (90 BASE) MCG/ACT inhaler Inhale 2 puffs into the lungs every 6 (six) hours as needed. Shortness of breath   Yes Historical Provider, MD  atenolol-chlorthalidone (TENORETIC) 50-25 MG per tablet Take 1 tablet by mouth daily.   Yes Historical Provider, MD  atorvastatin (LIPITOR) 40 MG tablet Take 40 mg by mouth daily.   Yes Historical Provider, MD  diclofenac sodium (VOLTAREN) 1 % GEL Apply 1 application topically 4 (four) times daily. Apply to upper joint extremities   Yes Historical Provider, MD  docusate sodium (COLACE) 100 MG capsule Take 100 mg by mouth 2  (two) times daily.   Yes Historical Provider, MD  ferrous sulfate 325 (65 FE) MG tablet Take 325 mg by mouth daily with breakfast.   Yes Historical Provider, MD  finasteride (PROSCAR) 5 MG tablet Take 5 mg by mouth daily.   Yes Historical Provider, MD  Fluticasone-Salmeterol (ADVAIR) 100-50 MCG/DOSE AEPB Inhale 1 puff into the lungs every 12 (twelve) hours. Shortness of breath   Yes Historical Provider, MD  mometasone (NASONEX) 50 MCG/ACT nasal spray Place 2 sprays into the nose daily.   Yes Historical Provider, MD  polyethylene glycol (MIRALAX / GLYCOLAX) packet Take 17 g by mouth 2 (two) times daily.   Yes Historical Provider, MD  potassium chloride (K-DUR) 10 MEQ tablet Take 10 mEq by mouth daily.   Yes Historical Provider, MD  senna (SENOKOT) 8.6 MG tablet Take 1 tablet by mouth daily.   Yes Historical Provider, MD  traMADol (ULTRAM) 50 MG tablet Take 50 mg by mouth every 8 (eight) hours as needed. Pain   Yes Historical Provider, MD  triazolam (HALCION) 0.125 MG tablet Take 0.125 mg by mouth at bedtime as needed. Insomnia   Yes Historical Provider, MD  warfarin (COUMADIN) 5 MG tablet Take 5 mg by mouth daily.   Yes Historical Provider, MD    Allergies:  No Known Allergies  Social History:   reports that he has never smoked. He  does not have any smokeless tobacco history on file. He reports that he does not drink alcohol or use illicit drugs. Lives at home alone and has a daughter.   Family History: No family history on file.  Physical Exam: Filed Vitals:   06/13/11 2218  BP: 104/56  Pulse: 74  Temp: 98.9 F (37.2 C)  TempSrc: Oral  Resp: 16  Height: 5\' 5"  (1.651 m)  Weight: 68.04 kg (150 lb)  SpO2: 98%   Blood pressure 104/56, pulse 74, temperature 98.9 F (37.2 C), temperature source Oral, resp. rate 16, height 5\' 5"  (1.651 m), weight 68.04 kg (150 lb), SpO2 98.00%.  Gen: Younger than stated age appearing M in no distress, appears quite  healthy and very well, able to  relate history HEENT: Pupils round, reactive, sclera clear, normal, mouth moist normal Lungs: CTAB no w/c/r, good air movement, normal exam Heart: Regular, not tachy S1/2 clear and normal, no m/g ABd: Soft, not tender, not distended, normal Extrem: Warm, perfusign well, normal bulk and tone, no BLE edema noted Buttock: Left buttock with a small area of induration and larger  surrounding cellulitis of faint pink Neuro: Alert, attentive, conversant, CN 2-12 intact, moves extremities  well Skin: Very diffuse seborrheic keratoses   Labs & Imaging Results for orders placed during the hospital encounter of 06/13/11 (from the past 48 hour(s))  URINALYSIS, ROUTINE W REFLEX MICROSCOPIC     Status: Normal   Collection Time   06/13/11 11:21 PM      Component Value Range Comment   Color, Urine YELLOW  YELLOW     APPearance CLEAR  CLEAR     Specific Gravity, Urine 1.023  1.005 - 1.030     pH 5.5  5.0 - 8.0     Glucose, UA NEGATIVE  NEGATIVE (mg/dL)    Hgb urine dipstick NEGATIVE  NEGATIVE     Bilirubin Urine NEGATIVE  NEGATIVE     Ketones, ur NEGATIVE  NEGATIVE (mg/dL)    Protein, ur NEGATIVE  NEGATIVE (mg/dL)    Urobilinogen, UA 1.0  0.0 - 1.0 (mg/dL)    Nitrite NEGATIVE  NEGATIVE     Leukocytes, UA NEGATIVE  NEGATIVE  MICROSCOPIC NOT DONE ON URINES WITH NEGATIVE PROTEIN, BLOOD, LEUKOCYTES, NITRITE, OR GLUCOSE <1000 mg/dL.  PROTIME-INR     Status: Abnormal   Collection Time   06/13/11 11:30 PM      Component Value Range Comment   Prothrombin Time 24.7 (*) 11.6 - 15.2 (seconds)    INR 2.19 (*) 0.00 - 1.49    CBC     Status: Normal   Collection Time   06/13/11 11:30 PM      Component Value Range Comment   WBC 7.7  4.0 - 10.5 (K/uL)    RBC 5.43  4.22 - 5.81 (MIL/uL)    Hemoglobin 15.3  13.0 - 17.0 (g/dL)    HCT 16.1  09.6 - 04.5 (%)    MCV 84.5  78.0 - 100.0 (fL)    MCH 28.2  26.0 - 34.0 (pg)    MCHC 33.3  30.0 - 36.0 (g/dL)    RDW 40.9  81.1 - 91.4 (%)    Platelets 171  150 - 400  (K/uL)   COMPREHENSIVE METABOLIC PANEL     Status: Abnormal   Collection Time   06/13/11 11:30 PM      Component Value Range Comment   Sodium 137  135 - 145 (mEq/L)    Potassium 3.1 (*) 3.5 -  5.1 (mEq/L)    Chloride 96  96 - 112 (mEq/L)    CO2 29  19 - 32 (mEq/L)    Glucose, Bld 118 (*) 70 - 99 (mg/dL)    BUN 19  6 - 23 (mg/dL)    Creatinine, Ser 1.61  0.50 - 1.35 (mg/dL)    Calcium 9.3  8.4 - 10.5 (mg/dL)    Total Protein 7.3  6.0 - 8.3 (g/dL)    Albumin 3.9  3.5 - 5.2 (g/dL)    AST 34  0 - 37 (U/L)    ALT 33  0 - 53 (U/L)    Alkaline Phosphatase 55  39 - 117 (U/L)    Total Bilirubin 0.3  0.3 - 1.2 (mg/dL)    GFR calc non Af Amer 63 (*) >90 (mL/min)    GFR calc Af Amer 73 (*) >90 (mL/min)   POCT I-STAT, CHEM 8     Status: Abnormal   Collection Time   06/13/11 11:40 PM      Component Value Range Comment   Sodium 139  135 - 145 (mEq/L)    Potassium 3.1 (*) 3.5 - 5.1 (mEq/L)    Chloride 99  96 - 112 (mEq/L)    BUN 21  6 - 23 (mg/dL)    Creatinine, Ser 0.96  0.50 - 1.35 (mg/dL)    Glucose, Bld 045 (*) 70 - 99 (mg/dL)    Calcium, Ion 4.09  1.12 - 1.32 (mmol/L)    TCO2 30  0 - 100 (mmol/L)    Hemoglobin 16.3  13.0 - 17.0 (g/dL)    HCT 81.1  91.4 - 78.2 (%)    Dg Chest Portable 1 View  06/13/2011  *RADIOLOGY REPORT*  Clinical Data: Fever, weakness.  PORTABLE CHEST - 1 VIEW  Comparison: 03/27/2010  Findings: Allowing for differences in technique, cardiomediastinal contours are unchanged, upper normal limits.  There is mild interstitial prominence / bronchial wall thickening.  No focal consolidation.  No pleural effusion or pneumothorax.  High-riding humeral heads with degenerative changes. Multilevel vertebral degenerative changes.  No acute osseous finding.  IMPRESSION: Bronchitic changes without focal consolidation.  Original Report Authenticated By: Waneta Martins, M.D.    Impression Present on Admission:  .Abscess .Fever   87yoM with h/o DVT on chronic coumadin, asthma,  HTN, HL presents with  fever, left buttock abscess  1. Fever, buttock abscess: He does not look ill at all, and WBC low.  Admission requested observation for IV ABx and bc of patient's advanced  age, lives at home alone. I don't suspect he'll be in the hospital long.   - IV Vancomycin  2. Continue all home meds including coumadin for prior DVT   3. He said he had blood in urine today but UA does not support this at all.   DVT prophy coumadin Regular bed, admit under Dr. Donette Larry. I have called [facility] and left message.  Presumed full code   Other plans as per orders.    Aronda Burford 06/14/2011, 3:03 AM

## 2011-06-14 NOTE — Progress Notes (Signed)
ANTICOAGULATION CONSULT NOTE - Initial Consult  Pharmacy Consult for vancomycin/warfarin Hx of DVT/fever, left buttock abscess   No Known Allergies  Patient Measurements: Height: 5\' 5"  (165.1 cm) Weight: 150 lb (68.04 kg) IBW/kg (Calculated) : 61.5  Heparin Dosing Weight:   Vital Signs: Temp: 98 F (36.7 C) (05/20 0432) Temp src: Oral (05/20 0432) BP: 133/73 mmHg (05/20 0432) Pulse Rate: 70  (05/20 0432)  Labs:  Basename 06/13/11 2340 06/13/11 2330  HGB 16.3 15.3  HCT 48.0 45.9  PLT -- 171  APTT -- --  LABPROT -- 24.7*  INR -- 2.19*  HEPARINUNFRC -- --  CREATININE 1.10 1.04  CKTOTAL -- --  CKMB -- --  TROPONINI -- --    Estimated Creatinine Clearance: 42.7 ml/min (by C-G formula based on Cr of 1.1).   Medical History: Past Medical History  Diagnosis Date  . Hypertension   . Hyperlipidemia   . DVT (deep venous thrombosis), right     coumadin  . Constipation     Medications:  Prescriptions prior to admission  Medication Sig Dispense Refill  . acetaminophen (TYLENOL) 500 MG tablet Take 500 mg by mouth every 6 (six) hours as needed. Fever      . albuterol (PROVENTIL HFA;VENTOLIN HFA) 108 (90 BASE) MCG/ACT inhaler Inhale 2 puffs into the lungs every 6 (six) hours as needed. Shortness of breath      . atenolol-chlorthalidone (TENORETIC) 50-25 MG per tablet Take 1 tablet by mouth daily.      Marland Kitchen atorvastatin (LIPITOR) 40 MG tablet Take 40 mg by mouth daily.      . diclofenac sodium (VOLTAREN) 1 % GEL Apply 1 application topically 4 (four) times daily. Apply to upper joint extremities      . docusate sodium (COLACE) 100 MG capsule Take 100 mg by mouth 2 (two) times daily.      . ferrous sulfate 325 (65 FE) MG tablet Take 325 mg by mouth daily with breakfast.      . finasteride (PROSCAR) 5 MG tablet Take 5 mg by mouth daily.      . Fluticasone-Salmeterol (ADVAIR) 100-50 MCG/DOSE AEPB Inhale 1 puff into the lungs every 12 (twelve) hours. Shortness of breath      .  mometasone (NASONEX) 50 MCG/ACT nasal spray Place 2 sprays into the nose daily.      . polyethylene glycol (MIRALAX / GLYCOLAX) packet Take 17 g by mouth 2 (two) times daily.      . potassium chloride (K-DUR) 10 MEQ tablet Take 10 mEq by mouth daily.      Marland Kitchen senna (SENOKOT) 8.6 MG tablet Take 1 tablet by mouth daily.      . traMADol (ULTRAM) 50 MG tablet Take 50 mg by mouth every 8 (eight) hours as needed. Pain      . triazolam (HALCION) 0.125 MG tablet Take 0.125 mg by mouth at bedtime as needed. Insomnia      . warfarin (COUMADIN) 5 MG tablet Take 5 mg by mouth daily.        Assessment: Patient with chronic warfarin for DVT, with fever, left buttock abscess.  First dose of antibiotics already given in ED.  INR at goal.  Goal of Therapy:  INR 2-3 Vancomycin 15-20   Plan:  Vancomycin 500mg  iv q12hr, Daily INR, continue home warfarin dose.   Craig Freeman, Craig Freeman 06/14/2011,4:45 AM

## 2011-06-14 NOTE — Consult Note (Signed)
Check in am.  If it needs drainage then OR.  If better cont abx

## 2011-06-14 NOTE — Consult Note (Signed)
WOC consult Note Reason for Consult:Patient consulted at the same time that CCS was consulted.  I will not see.  CCS to recommend wound care and whether or not additional surgical procedure is needed. Wound type:Abscess Pressure Ulcer POA: No Spoke with Will Jennings, CCS PA and he is going to see today. I will remain available, but will not follow.  Please re-consult if needed. Thanks, Ladona Mow, MSN, RN, [facility], CWOCN [phone])

## 2011-06-14 NOTE — ED Notes (Signed)
MD at bedside. 

## 2011-06-14 NOTE — Consult Note (Signed)
Reason for Consult: Left Buttocks abscess Referring Physician: [name] is an 78 y.o. male.  HPI: Patient's 78 year old gentleman states he did well on the last Tuesday and again a sore area on his left buttocks. Had discomfort with it and yesterday it became extremely painful. He also spiked a temperature to 104. He was seen in the emergency room and admitted placed on IV vancomycin. He went an incision and drainage in the ER the incision is extremely small and has already sealed. We were asked to see in consultation.  Past Medical History  Diagnosis Date  . Hypertension   . Hyperlipidemia   . DVT (deep venous thrombosis), right     coumadin  . Constipation BPH GERD Asthma     Past Surgical History  Procedure Date  . Joint replacement left     No family history on file.  Social History:  reports that he has never smoked. He does not have any smokeless tobacco history on file. He reports that he does not drink alcohol or use illicit drugs.  Allergies: No Known Allergies  Medications:  Prior to Admission:  Prescriptions prior to admission  Medication Sig Dispense Refill  . acetaminophen (TYLENOL) 500 MG tablet Take 500 mg by mouth every 6 (six) hours as needed. Fever      . albuterol (PROVENTIL HFA;VENTOLIN HFA) 108 (90 BASE) MCG/ACT inhaler Inhale 2 puffs into the lungs every 6 (six) hours as needed. Shortness of breath      . atenolol-chlorthalidone (TENORETIC) 50-25 MG per tablet Take 1 tablet by mouth daily.      Marland Kitchen atorvastatin (LIPITOR) 40 MG tablet Take 40 mg by mouth daily.      . diclofenac sodium (VOLTAREN) 1 % GEL Apply 1 application topically 4 (four) times daily. Apply to upper joint extremities      . docusate sodium (COLACE) 100 MG capsule Take 100 mg by mouth 2 (two) times daily.      . ferrous sulfate 325 (65 FE) MG tablet Take 325 mg by mouth daily with breakfast.      . finasteride (PROSCAR) 5 MG tablet Take 5 mg by mouth daily.      .  Fluticasone-Salmeterol (ADVAIR) 100-50 MCG/DOSE AEPB Inhale 1 puff into the lungs every 12 (twelve) hours. Shortness of breath      . mometasone (NASONEX) 50 MCG/ACT nasal spray Place 2 sprays into the nose daily.      . polyethylene glycol (MIRALAX / GLYCOLAX) packet Take 17 g by mouth 2 (two) times daily.      . potassium chloride (K-DUR) 10 MEQ tablet Take 10 mEq by mouth daily.      Marland Kitchen senna (SENOKOT) 8.6 MG tablet Take 1 tablet by mouth daily.      . traMADol (ULTRAM) 50 MG tablet Take 50 mg by mouth every 8 (eight) hours as needed. Pain      . triazolam (HALCION) 0.125 MG tablet Take 0.125 mg by mouth at bedtime as needed. Insomnia      . warfarin (COUMADIN) 5 MG tablet Take 5 mg by mouth daily.       Scheduled:   . acetaminophen  650 mg Oral Once  . atenolol  50 mg Oral Daily  . atorvastatin  40 mg Oral Daily  . chlorthalidone  25 mg Oral Daily  . diclofenac sodium  1 application Topical QID  . docusate sodium  100 mg Oral BID  . ferrous sulfate  325 mg Oral  Q breakfast  . finasteride  5 mg Oral Daily  . fluticasone  1 spray Each Nare Daily  . Fluticasone-Salmeterol  1 puff Inhalation Q12H  . lidocaine-EPINEPHrine      . polyethylene glycol  17 g Oral BID  . potassium chloride  10 mEq Oral BID  . potassium chloride  40 mEq Oral Once  . senna  1 tablet Oral BID  . sodium chloride  3 mL Intravenous Q12H  . vancomycin  500 mg Intravenous Q12H  . vancomycin  1,000 mg Intravenous Once  . warfarin  2.5 mg Oral ONCE-1800  . Warfarin - Pharmacist Dosing Inpatient   Does not apply q1800  . DISCONTD: atenolol-chlorthalidone  1 tablet Oral Daily  . DISCONTD: docusate sodium  100 mg Oral BID  . DISCONTD: potassium chloride  10 mEq Oral Daily  . DISCONTD: senna  1 tablet Oral Daily  . DISCONTD: warfarin  5 mg Oral q1800   Continuous:   . DISCONTD: sodium chloride Stopped (06/14/11 0352)   ONG:EXBMWU chloride, acetaminophen, albuterol, ondansetron (ZOFRAN) IV, ondansetron, sodium  chloride, traMADol, triazolam Anti-infectives     Start     Dose/Rate Route Frequency Ordered Stop   06/14/11 1200   vancomycin (VANCOCIN) 500 mg in sodium chloride 0.9 % 100 mL IVPB        500 mg 100 mL/hr over 60 Minutes Intravenous Every 12 hours 06/14/11 0444     06/13/11 2330   vancomycin (VANCOCIN) IVPB 1000 mg/200 mL premix        1,000 mg 200 mL/hr over 60 Minutes Intravenous  Once 06/13/11 2316 06/14/11 0030          Results for orders placed during the hospital encounter of 06/13/11 (from the past 48 hour(s))  URINALYSIS, ROUTINE W REFLEX MICROSCOPIC     Status: Normal   Collection Time   06/13/11 11:21 PM      Component Value Range Comment   Color, Urine YELLOW  YELLOW     APPearance CLEAR  CLEAR     Specific Gravity, Urine 1.023  1.005 - 1.030     pH 5.5  5.0 - 8.0     Glucose, UA NEGATIVE  NEGATIVE (mg/dL)    Hgb urine dipstick NEGATIVE  NEGATIVE     Bilirubin Urine NEGATIVE  NEGATIVE     Ketones, ur NEGATIVE  NEGATIVE (mg/dL)    Protein, ur NEGATIVE  NEGATIVE (mg/dL)    Urobilinogen, UA 1.0  0.0 - 1.0 (mg/dL)    Nitrite NEGATIVE  NEGATIVE     Leukocytes, UA NEGATIVE  NEGATIVE  MICROSCOPIC NOT DONE ON URINES WITH NEGATIVE PROTEIN, BLOOD, LEUKOCYTES, NITRITE, OR GLUCOSE <1000 mg/dL.  PROTIME-INR     Status: Abnormal   Collection Time   06/13/11 11:30 PM      Component Value Range Comment   Prothrombin Time 24.7 (*) 11.6 - 15.2 (seconds)    INR 2.19 (*) 0.00 - 1.49    CBC     Status: Normal   Collection Time   06/13/11 11:30 PM      Component Value Range Comment   WBC 7.7  4.0 - 10.5 (K/uL)    RBC 5.43  4.22 - 5.81 (MIL/uL)    Hemoglobin 15.3  13.0 - 17.0 (g/dL)    HCT 13.2  44.0 - 10.2 (%)    MCV 84.5  78.0 - 100.0 (fL)    MCH 28.2  26.0 - 34.0 (pg)    MCHC 33.3  30.0 - 36.0 (  g/dL)    RDW 16.1  09.6 - 04.5 (%)    Platelets 171  150 - 400 (K/uL)   COMPREHENSIVE METABOLIC PANEL     Status: Abnormal   Collection Time   06/13/11 11:30 PM      Component Value  Range Comment   Sodium 137  135 - 145 (mEq/L)    Potassium 3.1 (*) 3.5 - 5.1 (mEq/L)    Chloride 96  96 - 112 (mEq/L)    CO2 29  19 - 32 (mEq/L)    Glucose, Bld 118 (*) 70 - 99 (mg/dL)    BUN 19  6 - 23 (mg/dL)    Creatinine, Ser 4.09  0.50 - 1.35 (mg/dL)    Calcium 9.3  8.4 - 10.5 (mg/dL)    Total Protein 7.3  6.0 - 8.3 (g/dL)    Albumin 3.9  3.5 - 5.2 (g/dL)    AST 34  0 - 37 (U/L)    ALT 33  0 - 53 (U/L)    Alkaline Phosphatase 55  39 - 117 (U/L)    Total Bilirubin 0.3  0.3 - 1.2 (mg/dL)    GFR calc non Af Amer 63 (*) >90 (mL/min)    GFR calc Af Amer 73 (*) >90 (mL/min)   POCT I-STAT, CHEM 8     Status: Abnormal   Collection Time   06/13/11 11:40 PM      Component Value Range Comment   Sodium 139  135 - 145 (mEq/L)    Potassium 3.1 (*) 3.5 - 5.1 (mEq/L)    Chloride 99  96 - 112 (mEq/L)    BUN 21  6 - 23 (mg/dL)    Creatinine, Ser 8.11  0.50 - 1.35 (mg/dL)    Glucose, Bld 914 (*) 70 - 99 (mg/dL)    Calcium, Ion 7.82  1.12 - 1.32 (mmol/L)    TCO2 30  0 - 100 (mmol/L)    Hemoglobin 16.3  13.0 - 17.0 (g/dL)    HCT 95.6  21.3 - 08.6 (%)   PROTIME-INR     Status: Abnormal   Collection Time   06/14/11  5:30 AM      Component Value Range Comment   Prothrombin Time 26.7 (*) 11.6 - 15.2 (seconds)    INR 2.42 (*) 0.00 - 1.49      Dg Chest Portable 1 View  06/13/2011  *RADIOLOGY REPORT*  Clinical Data: Fever, weakness.  PORTABLE CHEST - 1 VIEW  Comparison: 03/27/2010  Findings: Allowing for differences in technique, cardiomediastinal contours are unchanged, upper normal limits.  There is mild interstitial prominence / bronchial wall thickening.  No focal consolidation.  No pleural effusion or pneumothorax.  High-riding humeral heads with degenerative changes. Multilevel vertebral degenerative changes.  No acute osseous finding.  IMPRESSION: Bronchitic changes without focal consolidation.  Original Report Authenticated By: Waneta Martins, M.D.    Review of Systems  Constitutional:  Positive for fever (105 on admission). Negative for chills, weight loss, malaise/fatigue and diaphoresis.  HENT: Negative.   Eyes: Negative.   Respiratory: Negative.   Cardiovascular: Negative.   Gastrointestinal: Positive for heartburn. Negative for nausea, vomiting, abdominal pain, diarrhea, constipation, blood in stool and melena.  Genitourinary: Negative.   Musculoskeletal: Negative.   Skin:       Pain started buttock on left last Tuesday,5/14, but fever and pain got severe yesterday.  Neurological: Negative.  Negative for weakness.  Endo/Heme/Allergies: Negative.   Psychiatric/Behavioral: Negative.    Blood pressure 122/70, pulse  67, temperature 97.7 F (36.5 C), temperature source Oral, resp. rate 20, height 5\' 5"  (1.651 m), weight 68.04 kg (150 lb), SpO2 98.00%. Physical Exam  Constitutional: He is oriented to person, place, and time. He appears well-developed and well-nourished. No distress.  HENT:  Head: Normocephalic and atraumatic.  Nose: Nose normal.  Eyes: Conjunctivae and EOM are normal. Pupils are equal, round, and reactive to light. Right eye exhibits discharge. Left eye exhibits no discharge. No scleral icterus.  Neck: Normal range of motion. Neck supple. No JVD present. No tracheal deviation present. No thyromegaly present.  Cardiovascular: Normal rate, regular rhythm, normal heart sounds and intact distal pulses.   Respiratory: Effort normal and breath sounds normal. No respiratory distress. He has no wheezes. He has no rales. He exhibits no tenderness.  GI: Soft. Bowel sounds are normal. He exhibits no distension. There is no tenderness. There is no rebound and no guarding.  Genitourinary:       Left buttocks with a 3-5 Cm abscess small i/d has sealed. No drainage, it is extremely tender, and erythematous.  Musculoskeletal: Normal range of motion. He exhibits no edema and no tenderness.  Lymphadenopathy:    He has no cervical adenopathy.  Neurological: He is alert  and oriented to person, place, and time. He has normal reflexes. He displays normal reflexes. No cranial nerve deficit. He exhibits normal muscle tone. Coordination normal.  Skin: No rash noted. He is diaphoretic. No erythema. No pallor.       Description above.  Psychiatric: He has a normal mood and affect. His behavior is normal. Judgment and thought content normal.    Assessment/Plan: 1. Left buttocks abscess 2. Hypertension 3.GERD 4. Asthma 5.BPH 6.Hx DVT on coumadin INR 2.42   Plan:  Keep NPO after MN, I think he needs that opened more and he will need anesthesia for it. His INR is therapeutic his Coumadin is on hold, we will check in the morning and see where his INR is. If his INR comes down possible surgery tomorrow. And Zosyn to increase bacteria spectrum coverage. Recheck labs in a.m. Also. Will [facility] physician assistant for Dr. Harriette Bouillon.  Beonka Amesquita 06/14/2011, 5:09 PM

## 2011-06-14 NOTE — ED Notes (Signed)
Craig Freeman(Daughter) home # [phone], Cell# [phone]

## 2011-06-14 NOTE — Progress Notes (Signed)
UR complete 

## 2011-06-14 NOTE — Evaluation (Signed)
Physical Therapy Evaluation Patient Details Name: Craig Freeman MRN: [id number] DOB: 1924/06/08 Today's Date: 06/14/2011 Time: 1007-1020 PT Time Calculation (min): 13 min  PT Assessment / Plan / Recommendation Clinical Impression  Pt presents s/p I and D of left buttocks abcess.  Tolerated ambulation, stairs and balance activites very well with very little balance deficits.  Pt will require no further PT in acute venue or at home and is safe for home D/C.      PT Assessment  Patent does not need any further PT services    Follow Up Recommendations  No PT follow up    Barriers to Discharge        lEquipment Recommendations  None recommended by PT    Recommendations for Other Services     Frequency      Precautions / Restrictions Precautions Precautions: None Restrictions Weight Bearing Restrictions: No   Pertinent Vitals/Pain Some soreness in L buttock area, but states that it is much better than it was.       Mobility  Bed Mobility Bed Mobility: Supine to Sit;Sit to Supine Supine to Sit: 7: Independent Sit to Supine: 7: Independent Transfers Transfers: Sit to Stand;Stand to Sit Sit to Stand: 7: Independent;With upper extremity assist;From bed Stand to Sit: 7: Independent;To bed Ambulation/Gait Ambulation/Gait Assistance: 5: Supervision Ambulation Distance (Feet): 400 Feet Assistive device: None Ambulation/Gait Assistance Details: Supervision for safety.  Pt with no noted instability with ambulation.  Gait Pattern: Within Functional Limits Gait velocity: WFL Stairs: Yes Stairs Assistance: 5: Supervision Stairs Assistance Details (indicate cue type and reason): Supervision for safety.  Stair Management Technique: One rail Right;Alternating pattern;Forwards Number of Stairs: 10  Wheelchair Mobility Wheelchair Mobility: No    Exercises     PT Diagnosis:    PT Problem List:   PT Treatment Interventions:     PT Goals    Visit Information  Last PT  Received On: 06/14/11 Assistance Needed: +1    Subjective Data  Subjective: I'm moving around great Patient Stated Goal: I'm ready to get back home.    Prior Functioning  Home Living Lives With: Alone Available Help at Discharge: Family;Available PRN/intermittently Type of Home: House Home Access: Stairs to enter [employer] of Steps: 4 Entrance Stairs-Rails: Can reach both;Right;Left Home Layout: One level Bathroom Shower/Tub: Engineer, manufacturing systems: Standard Home Adaptive Equipment: Walker - rolling;Straight cane Additional Comments: Has equipment from previous hip sx.  Prior Function Level of Independence: Independent Able to Take Stairs?: Yes Driving: Yes Vocation: Retired Musician: No difficulties    Cognition  Overall Cognitive Status: Appears within functional limits for tasks assessed/performed Arousal/Alertness: Awake/alert Orientation Level: Appears intact for tasks assessed Behavior During Session: [facility] for tasks performed    Extremity/Trunk Assessment Right Lower Extremity Assessment RLE ROM/Strength/Tone: WFL for tasks assessed RLE Sensation: WFL - Light Touch RLE Coordination: WFL - gross motor Left Lower Extremity Assessment LLE ROM/Strength/Tone: WFL for tasks assessed LLE Sensation: WFL - Light Touch LLE Coordination: WFL - gross motor Trunk Assessment Trunk Assessment: Normal   Balance High Level Balance High Level Balance Activites: Turns High Level Balance Comments: Performed heel to toe x 30 seconds, 360 deg turns, standing on one foot x 15 secs all at stand by assist.    End of Session PT - End of Session Activity Tolerance: Patient tolerated treatment well Patient left: in bed;with call bell/phone within reach Nurse Communication: Mobility status   Page, Meribeth Mattes 06/14/2011, 10:40 AM

## 2011-06-14 NOTE — ED Provider Notes (Signed)
History     CSN: [id number]  Arrival date & time 06/13/11  2158   First MD Initiated Contact with Patient 06/13/11 2302      Chief Complaint  Patient presents with  . Abscess  . Hematuria  . Fever    (Consider location/radiation/quality/duration/timing/severity/associated sxs/prior treatment) HPI History provided by patient and daughter bedside. Fever to 104 at home with nausea but no vomiting. Last week patient noticed a bump on his left buttock. He scratched this area and since then has become more painful, red and swollen. No drainage. No difficulty having bowel movements. Is on Coumadin for her history of DVT and did notice some blood in his urine today. No dysuria. No back pain. Moderate severity. No blood in stools. Past Medical History  Diagnosis Date  . Hypertension   . Hyperlipidemia   . DVT (deep venous thrombosis), right   . Constipation     Past Surgical History  Procedure Date  . Joint replacement     No family history on file.  History  Substance Use Topics  . Smoking status: Never Smoker   . Smokeless tobacco: Not on file  . Alcohol Use: No      Review of Systems  Constitutional: Positive for fever. Negative for chills.  HENT: Negative for neck pain and neck stiffness.   Eyes: Negative for pain.  Respiratory: Negative for shortness of breath.   Cardiovascular: Negative for chest pain.  Gastrointestinal: Negative for abdominal pain.  Genitourinary: Negative for dysuria.  Musculoskeletal: Negative for back pain.  Skin: Positive for rash.  Neurological: Negative for headaches.  All other systems reviewed and are negative.    Allergies  Review of patient's allergies indicates no known allergies.  Home Medications   Current Outpatient Rx  Name Route Sig Dispense Refill  . ACETAMINOPHEN 500 MG PO TABS Oral Take 500 mg by mouth every 6 (six) hours as needed. Fever    . ALBUTEROL SULFATE HFA 108 (90 BASE) MCG/ACT IN AERS Inhalation Inhale 2  puffs into the lungs every 6 (six) hours as needed. Shortness of breath    . ATENOLOL-CHLORTHALIDONE 50-25 MG PO TABS Oral Take 1 tablet by mouth daily.    . ATORVASTATIN CALCIUM 40 MG PO TABS Oral Take 40 mg by mouth daily.    Marland Kitchen DICLOFENAC SODIUM 1 % TD GEL Topical Apply 1 application topically 4 (four) times daily. Apply to upper joint extremities    . DOCUSATE SODIUM 100 MG PO CAPS Oral Take 100 mg by mouth 2 (two) times daily.    Marland Kitchen FERROUS SULFATE 325 (65 FE) MG PO TABS Oral Take 325 mg by mouth daily with breakfast.    . FINASTERIDE 5 MG PO TABS Oral Take 5 mg by mouth daily.    Marland Kitchen FLUTICASONE-SALMETEROL 100-50 MCG/DOSE IN AEPB Inhalation Inhale 1 puff into the lungs every 12 (twelve) hours. Shortness of breath    . MOMETASONE FUROATE 50 MCG/ACT NA SUSP Nasal Place 2 sprays into the nose daily.    Marland Kitchen POLYETHYLENE GLYCOL 3350 PO PACK Oral Take 17 g by mouth 2 (two) times daily.    Marland Kitchen POTASSIUM CHLORIDE ER 10 MEQ PO TBCR Oral Take 10 mEq by mouth daily.    . SENNOSIDES 8.6 MG PO TABS Oral Take 1 tablet by mouth daily.    . TRAMADOL HCL 50 MG PO TABS Oral Take 50 mg by mouth every 8 (eight) hours as needed. Pain    . TRIAZOLAM 0.125 MG PO TABS Oral  Take 0.125 mg by mouth at bedtime as needed. Insomnia    . WARFARIN SODIUM 5 MG PO TABS Oral Take 5 mg by mouth daily.      BP 104/56  Pulse 74  Temp(Src) 98.9 F (37.2 C) (Oral)  Resp 16  Ht 5\' 5"  (1.651 m)  Wt 150 lb (68.04 kg)  BMI 24.96 kg/m2  SpO2 98%  Physical Exam  Constitutional: He is oriented to person, place, and time. He appears well-developed and well-nourished.  HENT:  Head: Normocephalic and atraumatic.  Eyes: Conjunctivae and EOM are normal. Pupils are equal, round, and reactive to light.  Neck: Trachea normal. Neck supple. No thyromegaly present.  Cardiovascular: Normal rate, regular rhythm, S1 normal, S2 normal and normal pulses.     No systolic murmur is present   No diastolic murmur is present  Pulses:      Radial  pulses are 2+ on the right side, and 2+ on the left side.  Pulmonary/Chest: Effort normal and breath sounds normal. He has no wheezes. He has no rhonchi. He has no rales. He exhibits no tenderness.  Abdominal: Soft. Normal appearance and bowel sounds are normal. There is no tenderness. There is no CVA tenderness and negative Murphy's sign.  Musculoskeletal:       Left buttock with area of erythema, Tenderness, induration and pointing. No extension into perirectal region.  BLE:s Calves nontender, no cords or erythema, negative Homans sign  Neurological: He is alert and oriented to person, place, and time. He has normal strength. No cranial nerve deficit or sensory deficit. GCS eye subscore is 4. GCS verbal subscore is 5. GCS motor subscore is 6.  Skin: Skin is warm and dry. No rash noted. He is not diaphoretic.  Psychiatric: His speech is normal.       Cooperative and appropriate    ED Course  INCISION AND DRAINAGE Date/Time: 06/14/2011 2:20 AM Performed by: Sunnie Nielsen Authorized by: Sunnie Nielsen Consent: Verbal consent obtained. Risks and benefits: risks, benefits and alternatives were discussed Consent given by: patient Patient understanding: patient states understanding of the procedure being performed Patient consent: the patient's understanding of the procedure matches consent given Procedure consent: procedure consent matches procedure scheduled Required items: required blood products, implants, devices, and special equipment available Patient identity confirmed: verbally with patient Time out: Immediately prior to procedure a "time out" was called to verify the correct patient, procedure, equipment, support staff and site/side marked as required. Type: abscess Location: Left buttock. Anesthesia: local infiltration Local anesthetic: lidocaine 1% with epinephrine Anesthetic total: 2 ml Patient sedated: no Scalpel size: 11 Incision type: single straight Complexity:  complex Drainage: serosanguinous Wound treatment: wound left open Patient tolerance: Patient tolerated the procedure well with no immediate complications.   (including critical care time)  Labs Reviewed  PROTIME-INR - Abnormal; Notable for the following:    Prothrombin Time 24.7 (*)    INR 2.19 (*)    All other components within normal limits  COMPREHENSIVE METABOLIC PANEL - Abnormal; Notable for the following:    Potassium 3.1 (*)    Glucose, Bld 118 (*)    GFR calc non Af Amer 63 (*)    GFR calc Af Amer 73 (*)    All other components within normal limits  POCT I-STAT, CHEM 8 - Abnormal; Notable for the following:    Potassium 3.1 (*)    Glucose, Bld 127 (*)    All other components within normal limits  CBC  URINALYSIS, ROUTINE W  REFLEX MICROSCOPIC   Dg Chest Portable 1 View  06/13/2011  *RADIOLOGY REPORT*  Clinical Data: Fever, weakness.  PORTABLE CHEST - 1 VIEW  Comparison: 03/27/2010  Findings: Allowing for differences in technique, cardiomediastinal contours are unchanged, upper normal limits.  There is mild interstitial prominence / bronchial wall thickening.  No focal consolidation.  No pleural effusion or pneumothorax.  High-riding humeral heads with degenerative changes. Multilevel vertebral degenerative changes.  No acute osseous finding.  IMPRESSION: Bronchitic changes without focal consolidation.  Original Report Authenticated By: Waneta Martins, M.D.      MDM   Abscess with associated cellulitis and fever. IV vancomycin with I&D as above.  2:21 AM case discussed with Dr. Kaylyn Layer on call for triad hospitalist. He agrees to admission for IV antibiotics.        Sunnie Nielsen, MD 06/14/11 Earle Gell

## 2011-06-14 NOTE — Progress Notes (Signed)
ANTIBIOTIC CONSULT NOTE - INITIAL  Pharmacy Consult for Zosyn/Vancomycin Indication: Left buttocks abscess  No Known Allergies  Patient Measurements: Height: 5\' 5"  (165.1 cm) Weight: 150 lb (68.04 kg) IBW/kg (Calculated) : 61.5    Vital Signs: Temp: 97.7 F (36.5 C) (05/20 1400) Temp src: Oral (05/20 1400) BP: 122/70 mmHg (05/20 1400) Pulse Rate: 67  (05/20 1400) Intake/Output from previous day: 05/19 0701 - 05/20 0700 In: -  Out: 150 [Urine:150] Intake/Output from this shift: Total I/O In: 240 [P.O.:240] Out: 0   Labs:  [facility] 06/13/11 2340 06/13/11 2330  WBC -- 7.7  HGB 16.3 15.3  PLT -- 171  LABCREA -- --  CREATININE 1.10 1.04   Estimated Creatinine Clearance: 42.7 ml/min (by C-G formula based on Cr of 1.1). No results found for this basename: VANCOTROUGH:2,VANCOPEAK:2,VANCORANDOM:2,GENTTROUGH:2,GENTPEAK:2,GENTRANDOM:2,TOBRATROUGH:2,TOBRAPEAK:2,TOBRARND:2,AMIKACINPEAK:2,AMIKACINTROU:2,AMIKACIN:2, in the last 72 hours   Microbiology: No results found for this or any previous visit (from the past 720 hour(s)).  Medical History: Past Medical History  Diagnosis Date  . Hypertension   . Hyperlipidemia   . DVT (deep venous thrombosis), right     coumadin  . Constipation     Medications:  Scheduled:    . acetaminophen  650 mg Oral Once  . atenolol  50 mg Oral Daily  . atorvastatin  40 mg Oral Daily  . chlorthalidone  25 mg Oral Daily  . diclofenac sodium  1 application Topical QID  . docusate sodium  100 mg Oral BID  . ferrous sulfate  325 mg Oral Q breakfast  . finasteride  5 mg Oral Daily  . fluticasone  1 spray Each Nare Daily  . Fluticasone-Salmeterol  1 puff Inhalation Q12H  . lidocaine-EPINEPHrine      . phytonadione  2.5 mg Oral Once  . piperacillin-tazobactam (ZOSYN)  IV  3.375 g Intravenous Q8H  . polyethylene glycol  17 g Oral BID  . potassium chloride  10 mEq Oral BID  . potassium chloride  40 mEq Oral Once  . senna  1 tablet Oral BID    . sodium chloride  3 mL Intravenous Q12H  . vancomycin  500 mg Intravenous Q12H  . vancomycin  1,000 mg Intravenous Once  . DISCONTD: atenolol-chlorthalidone  1 tablet Oral Daily  . DISCONTD: docusate sodium  100 mg Oral BID  . DISCONTD: potassium chloride  10 mEq Oral Daily  . DISCONTD: senna  1 tablet Oral Daily  . DISCONTD: warfarin  2.5 mg Oral ONCE-1800  . DISCONTD: warfarin  5 mg Oral q1800  . DISCONTD: Warfarin - Pharmacist Dosing Inpatient   Does not apply q1800   Infusions:    . DISCONTD: sodium chloride Stopped (06/14/11 0352)   PRN: sodium chloride, acetaminophen, albuterol, ondansetron (ZOFRAN) IV, ondansetron, sodium chloride, traMADol, triazolam Assessment:  78 yo M with Left buttocks abscess, previous started on vancomycin earlier today, now to add Zosyn for broader coverage.  CrCl 42 ml/min   Goal of Therapy:  Zosyn per renal function Vancomycin trough 15-20   Plan:  1.) Continue Vancomycin 2.) Started Zosyn 3.375 gm IV q8h, first dose now 3.) Pharmacy will monitor INR/plan for coumadin.  Will hold for now and follow.  4.) Monitor renal function 5.) Vancomycin troughs as needed  Oriyah Lamphear, Loma Messing PharmD 6:27 PM 06/14/2011

## 2011-06-14 NOTE — Progress Notes (Signed)
Subjective: Feel better after local I&D in ER of left buttock Abscess Still some tenderness in area No fever Low K  Objective: Vital signs in last 24 hours: Temp:  [98 F (36.7 C)-98.9 F (37.2 C)] 98 F (36.7 C) (05/20 0432) Pulse Rate:  [70-74] 70  (05/20 0432) Resp:  [16] 16  (05/20 0432) BP: (104-133)/(56-73) 133/73 mmHg (05/20 0432) SpO2:  [98 %-99 %] 99 % (05/20 0432) Weight:  [68.04 kg (150 lb)] 68.04 kg (150 lb) (05/20 0432) Weight change:  Last BM Date: 06/13/11  Intake/Output from previous day: 05/19 0701 - 05/20 0700 In: -  Out: 150 [Urine:150] Intake/Output this shift:    General appearance: alert Resp: clear to auscultation bilaterally Cardio: regular rate and rhythm Skin: Skin color, texture, turgor normal. No rashes or lesions or left buttock with area of tenderness, and abscess, with much drainage  Lab Results:  [facility] 06/13/11 2340 06/13/11 2330  WBC -- 7.7  HGB 16.3 15.3  HCT 48.0 45.9  PLT -- 171   BMET  Basename 06/13/11 2340 06/13/11 2330  NA 139 137  K 3.1* 3.1*  CL 99 96  CO2 -- 29  GLUCOSE 127* 118*  BUN 21 19  CREATININE 1.10 1.04  CALCIUM -- 9.3    Studies/Results: Dg Chest Portable 1 View  06/13/2011  *RADIOLOGY REPORT*  Clinical Data: Fever, weakness.  PORTABLE CHEST - 1 VIEW  Comparison: 03/27/2010  Findings: Allowing for differences in technique, cardiomediastinal contours are unchanged, upper normal limits.  There is mild interstitial prominence / bronchial wall thickening.  No focal consolidation.  No pleural effusion or pneumothorax.  High-riding humeral heads with degenerative changes. Multilevel vertebral degenerative changes.  No acute osseous finding.  IMPRESSION: Bronchitic changes without focal consolidation.  Original Report Authenticated By: Waneta Martins, M.D.    Medications: I have reviewed the patient's current medications.  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

## 2011-06-15 LAB — MAGNESIUM: Magnesium: 1.8 mg/dL (ref 1.5–2.5)

## 2011-06-15 LAB — BASIC METABOLIC PANEL
BUN: 11 mg/dL (ref 6–23)
CO2: 29 mEq/L (ref 19–32)
Chloride: 100 mEq/L (ref 96–112)
Creatinine, Ser: 1.04 mg/dL (ref 0.50–1.35)
Glucose, Bld: 104 mg/dL — ABNORMAL HIGH (ref 70–99)

## 2011-06-15 LAB — CBC
HCT: 42.4 % (ref 39.0–52.0)
MCH: 28.1 pg (ref 26.0–34.0)
MCHC: 33 g/dL (ref 30.0–36.0)
MCV: 85 fL (ref 78.0–100.0)
RDW: 13.8 % (ref 11.5–15.5)

## 2011-06-15 MED ORDER — WARFARIN SODIUM 7.5 MG PO TABS
7.5000 mg | ORAL_TABLET | Freq: Once | ORAL | Status: DC
  Filled 2011-06-15: qty 1

## 2011-06-15 MED ORDER — ATENOLOL-CHLORTHALIDONE 50-25 MG PO TABS: 0.5000 | ORAL_TABLET | Freq: Every day | ORAL | Status: DC

## 2011-06-15 MED ORDER — WARFARIN SODIUM 5 MG PO TABS
5.0000 mg | ORAL_TABLET | Freq: Once | ORAL | Status: AC
  Administered 2011-06-15: 5 mg via ORAL
  Filled 2011-06-15: qty 1

## 2011-06-15 MED ORDER — CHLORTHALIDONE 25 MG PO TABS
12.5000 mg | ORAL_TABLET | Freq: Every day | ORAL | Status: DC
  Administered 2011-06-15 – 2011-06-16 (×2): 12.5 mg via ORAL
  Filled 2011-06-15 (×2): qty 0.5

## 2011-06-15 MED ORDER — POTASSIUM CHLORIDE ER 10 MEQ PO TBCR: 20.0000 meq | EXTENDED_RELEASE_TABLET | Freq: Two times a day (BID) | ORAL | Status: DC

## 2011-06-15 MED ORDER — POTASSIUM CHLORIDE CRYS ER 20 MEQ PO TBCR
40.0000 meq | EXTENDED_RELEASE_TABLET | Freq: Once | ORAL | Status: AC
  Administered 2011-06-15: 40 meq via ORAL
  Filled 2011-06-15: qty 2

## 2011-06-15 MED ORDER — WARFARIN - PHARMACIST DOSING INPATIENT: Freq: Every day | Status: DC

## 2011-06-15 MED ORDER — AMOXICILLIN-POT CLAVULANATE 875-125 MG PO TABS: 1.0000 | ORAL_TABLET | Freq: Two times a day (BID) | ORAL | Status: DC

## 2011-06-15 MED ORDER — POTASSIUM CHLORIDE ER 10 MEQ PO TBCR
20.0000 meq | EXTENDED_RELEASE_TABLET | Freq: Two times a day (BID) | ORAL | Status: DC
  Administered 2011-06-15 – 2011-06-16 (×3): 20 meq via ORAL
  Filled 2011-06-15 (×4): qty 2

## 2011-06-15 NOTE — Progress Notes (Signed)
Subjective: Some pain Abscess some better- still pain on lying on left side Surgical I &D INR 1.7 Low K  Objective: Vital signs in last 24 hours: Temp:  [97.7 F (36.5 C)-98.4 F (36.9 C)] 97.9 F (36.6 C) (05/21 0605) Pulse Rate:  [67-77] 77  (05/21 0605) Resp:  [18-20] 18  (05/21 0605) BP: (102-128)/(64-74) 102/64 mmHg (05/21 0605) SpO2:  [95 %-99 %] 97 % (05/21 0605) Weight change:  Last BM Date: 06/13/11  Intake/Output from previous day: 05/20 0701 - 05/21 0700 In: 680 [P.O.:480; IV Piggyback:200] Out: 825 [Urine:825] Intake/Output this shift: Total I/O In: 200 [IV Piggyback:200] Out: 825 [Urine:825]  General appearance: alert Resp: clear to auscultation bilaterally Cardio: regular rate and rhythm Skin: left buttock abscess better  Lab Results:  Basename 06/15/11 0409 06/13/11 2340 06/13/11 2330  WBC 8.9 -- 7.7  HGB 14.0 16.3 --  HCT 42.4 48.0 --  PLT 142* -- 171   BMET  Basename 06/15/11 0409 06/13/11 2340 06/13/11 2330  NA 139 139 --  K 2.9* 3.1* --  CL 100 99 --  CO2 29 -- 29  GLUCOSE 104* 127* --  BUN 11 21 --  CREATININE 1.04 1.10 --  CALCIUM 9.2 -- 9.3    Studies/Results: Dg Chest Portable 1 View  06/13/2011  *RADIOLOGY REPORT*  Clinical Data: Fever, weakness.  PORTABLE CHEST - 1 VIEW  Comparison: 03/27/2010  Findings: Allowing for differences in technique, cardiomediastinal contours are unchanged, upper normal limits.  There is mild interstitial prominence / bronchial wall thickening.  No focal consolidation.  No pleural effusion or pneumothorax.  High-riding humeral heads with degenerative changes. Multilevel vertebral degenerative changes.  No acute osseous finding.  IMPRESSION: Bronchitic changes without focal consolidation.  Original Report Authenticated By: Waneta Martins, M.D.    Medications: I have reviewed the patient's current medications.  Assessment/Plan: Left buttock abscess  Continue IV vancomycin Laqueta Jean Surgical I&D  Per  surgry if  needed  Wound care  HTN/ low K- replace -  Decrease Dieretic 1/2 tablet H/o Chronic DVT/ on Coumadin- INR - per pharmacy   LOS: 2 days   Braelyn Bordonaro 06/15/2011, 6:52 AM

## 2011-06-15 NOTE — Progress Notes (Signed)
Patient is alert and oriented, vital signs are stable, pt with no complaints of pain or discomfort, dressing to left buttock is clean dry and intact, pt tolerating regular diet, daughter at bedside and supportive, will continue to monitor patient Means, Myrtie Hawk RN 06-15-11 17:11pm

## 2011-06-15 NOTE — Progress Notes (Addendum)
ANTICOAGULATION CONSULT NOTE - Follow Up Consult  Pharmacy Consult for warfarin Indication: hx DVT  No Known Allergies  Patient Measurements: Height: 5\' 5"  (165.1 cm) Weight: 150 lb (68.04 kg) IBW/kg (Calculated) : 61.5   Vital Signs: Temp: 97.6 F (36.4 C) (05/21 0835) Temp src: Oral (05/21 0835) BP: 106/67 mmHg (05/21 0835) Pulse Rate: 70  (05/21 0835)  Labs:  Basename 06/15/11 0409 06/14/11 0530 06/13/11 2340 06/13/11 2330  HGB 14.0 -- 16.3 --  HCT 42.4 -- 48.0 45.9  PLT 142* -- -- 171  APTT -- -- -- --  LABPROT 20.3* 26.7* -- 24.7*  INR 1.70* 2.42* -- 2.19*  HEPARINUNFRC -- -- -- --  CREATININE 1.04 -- 1.10 1.04  CKTOTAL -- -- -- --  CKMB -- -- -- --  TROPONINI -- -- -- --    Estimated Creatinine Clearance: 45.2 ml/min (by C-G formula based on Cr of 1.04).   Medications:  Scheduled:    . atenolol  50 mg Oral Daily  . atorvastatin  40 mg Oral Daily  . chlorthalidone  12.5 mg Oral Daily  . diclofenac sodium  1 application Topical QID  . docusate sodium  100 mg Oral BID  . ferrous sulfate  325 mg Oral Q breakfast  . finasteride  5 mg Oral Daily  . fluticasone  1 spray Each Nare Daily  . Fluticasone-Salmeterol  1 puff Inhalation Q12H  . phytonadione  2.5 mg Oral Once  . piperacillin-tazobactam (ZOSYN)  IV  3.375 g Intravenous Q8H  . polyethylene glycol  17 g Oral BID  . potassium chloride  20 mEq Oral BID  . potassium chloride  40 mEq Oral Once  . senna  1 tablet Oral BID  . sodium chloride  3 mL Intravenous Q12H  . vancomycin  500 mg Intravenous Q12H  . DISCONTD: chlorthalidone  25 mg Oral Daily  . DISCONTD: potassium chloride  10 mEq Oral BID  . DISCONTD: warfarin  2.5 mg Oral ONCE-1800  . DISCONTD: Warfarin - Pharmacist Dosing Inpatient   Does not apply q1800   Infusions:    Assessment:  Per Dr. Luisa Hart to resume warfarin today due to patient improvement and holding off on I&D now.   INR subtherapeutic after no doses given yesterday and vitamin  K given  Warfarin 5mg  Tues/Thurs/Fri and 2.65m on Mon/Wed/Fri  Goal of Therapy:  INR 2-3   Plan:   Warfarin 5mg  today  Daily INR   Hessie Knows, PharmD, BCPS Pager [phone] 06/15/2011 11:10 AM

## 2011-06-15 NOTE — Progress Notes (Signed)
  Subjective: Left buttock cellulitis improved.  Still sore.  Objective: Vital signs in last 24 hours: Temp:  [97.6 F (36.4 C)-98.4 F (36.9 C)] 97.6 F (36.4 C) (05/21 0835) Pulse Rate:  [67-77] 70  (05/21 0835) Resp:  [18-20] 18  (05/21 0835) BP: (102-128)/(64-74) 106/67 mmHg (05/21 0835) SpO2:  [96 %-99 %] 98 % (05/21 0844) Last BM Date: 06/13/11  Intake/Output from previous day: 05/20 0701 - 05/21 0700 In: 680 [P.O.:480; IV Piggyback:200] Out: 825 [Urine:825] Intake/Output this shift: Total I/O In: -  Out: 150 [Urine:150]  left buttock with area of induration 3 cm no fluctuance.  Min red.  Lab Results:   Basename 06/15/11 0409 06/13/11 2340 06/13/11 2330  WBC 8.9 -- 7.7  HGB 14.0 16.3 --  HCT 42.4 48.0 --  PLT 142* -- 171   BMET  Basename 06/15/11 0409 06/13/11 2340 06/13/11 2330  NA 139 139 --  K 2.9* 3.1* --  CL 100 99 --  CO2 29 -- 29  GLUCOSE 104* 127* --  BUN 11 21 --  CREATININE 1.04 1.10 --  CALCIUM 9.2 -- 9.3   PT/INR  Basename 06/15/11 0409 06/14/11 0530  LABPROT 20.3* 26.7*  INR 1.70* 2.42*   ABG No results found for this basename: PHART:2,PCO2:2,PO2:2,HCO3:2 in the last 72 hours  Studies/Results: Dg Chest Portable 1 View  06/13/2011  *RADIOLOGY REPORT*  Clinical Data: Fever, weakness.  PORTABLE CHEST - 1 VIEW  Comparison: 03/27/2010  Findings: Allowing for differences in technique, cardiomediastinal contours are unchanged, upper normal limits.  There is mild interstitial prominence / bronchial wall thickening.  No focal consolidation.  No pleural effusion or pneumothorax.  High-riding humeral heads with degenerative changes. Multilevel vertebral degenerative changes.  No acute osseous finding.  IMPRESSION: Bronchitic changes without focal consolidation.  Original Report Authenticated By: Waneta Martins, M.D.    Anti-infectives: Anti-infectives     Start     Dose/Rate Route Frequency Ordered Stop   06/14/11 1830    piperacillin-tazobactam (ZOSYN) IVPB 3.375 g        3.375 g 12.5 mL/hr over 240 Minutes Intravenous Every 8 hours 06/14/11 1822     06/14/11 1200   vancomycin (VANCOCIN) 500 mg in sodium chloride 0.9 % 100 mL IVPB        500 mg 100 mL/hr over 60 Minutes Intravenous Every 12 hours 06/14/11 0444     06/13/11 2330   vancomycin (VANCOCIN) IVPB 1000 mg/200 mL premix        1,000 mg 200 mL/hr over 60 Minutes Intravenous  Once 06/13/11 2316 06/14/11 0030          Assessment/Plan:  Left buttock abscess and cellulitis.  Seems better.  Indurated but not fluctuant.  Hold on I and D.   Restart diet and coumadin at this point.  Continue antibiotics.  LOS: 2 days    Ajax Schroll A. 06/15/2011

## 2011-06-16 LAB — PROTIME-INR: INR: 1.23 (ref 0.00–1.49)

## 2011-06-16 LAB — BASIC METABOLIC PANEL
CO2: 30 mEq/L (ref 19–32)
Calcium: 9.1 mg/dL (ref 8.4–10.5)
GFR calc non Af Amer: 61 mL/min — ABNORMAL LOW (ref >90)
Potassium: 3.3 mEq/L — ABNORMAL LOW (ref 3.5–5.1)
Sodium: 139 mEq/L (ref 135–145)

## 2011-06-16 MED ORDER — VANCOMYCIN HCL IN DEXTROSE 1-5 GM/200ML-% IV SOLN
1000.0000 mg | Freq: Two times a day (BID) | INTRAVENOUS | Status: DC
  Administered 2011-06-16: 1000 mg via INTRAVENOUS
  Filled 2011-06-16 (×3): qty 200

## 2011-06-16 MED ORDER — AMOXICILLIN-POT CLAVULANATE 875-125 MG PO TABS: 1.0000 | ORAL_TABLET | Freq: Two times a day (BID) | ORAL | Status: AC

## 2011-06-16 MED ORDER — ATENOLOL-CHLORTHALIDONE 50-25 MG PO TABS: 1.0000 | ORAL_TABLET | Freq: Every day | ORAL | Status: DC

## 2011-06-16 MED ORDER — WARFARIN SODIUM 10 MG PO TABS
10.0000 mg | ORAL_TABLET | Freq: Once | ORAL | Status: AC
  Administered 2011-06-16: 10 mg via ORAL
  Filled 2011-06-16: qty 1

## 2011-06-16 MED ORDER — POTASSIUM CHLORIDE ER 10 MEQ PO TBCR: 20.0000 meq | EXTENDED_RELEASE_TABLET | Freq: Two times a day (BID) | ORAL | Status: DC

## 2011-06-16 NOTE — Progress Notes (Signed)
ANTIBIOTIC CONSULT NOTE - FOLLOW UP  Pharmacy Consult for vancomycin Indication: Left buttocks abscess   No Known Allergies  Patient Measurements: Height: 5\' 5"  (165.1 cm) Weight: 150 lb (68.04 kg) IBW/kg (Calculated) : 61.5  Adjusted Body Weight:   Vital Signs: Temp: 99.1 F (37.3 C) (05/21 2104) Temp src: Oral (05/21 2104) BP: 114/70 mmHg (05/21 2104) Pulse Rate: 74  (05/21 2104) Intake/Output from previous day: 05/21 0701 - 05/22 0700 In: 1200 [P.O.:1200] Out: 1100 [Urine:1100] Intake/Output from this shift: Total I/O In: 240 [P.O.:240] Out: 250 [Urine:250]  Labs:  [facility] 06/15/11 0409 06/13/11 2340 06/13/11 2330  WBC 8.9 -- 7.7  HGB 14.0 16.3 15.3  PLT 142* -- 171  LABCREA -- -- --  CREATININE 1.04 1.10 1.04   Estimated Creatinine Clearance: 45.2 ml/min (by C-G formula based on Cr of 1.04).  Basename 06/15/11 0300  VANCOTROUGH 6.6*  VANCOPEAK --  VANCORANDOM --  GENTTROUGH --  GENTPEAK --  GENTRANDOM --  TOBRATROUGH --  TOBRAPEAK --  TOBRARND --  AMIKACINPEAK --  AMIKACINTROU --  AMIKACIN --     Microbiology: No results found for this or any previous visit (from the past 720 hour(s)).  Anti-infectives     Start     Dose/Rate Route Frequency Ordered Stop   06/16/11 0800   vancomycin (VANCOCIN) IVPB 1000 mg/200 mL premix        1,000 mg 200 mL/hr over 60 Minutes Intravenous Every 12 hours 06/16/11 0311     06/15/11 0000   amoxicillin-clavulanate (AUGMENTIN) 875-125 MG per tablet        1 tablet Oral 2 times daily 06/15/11 2307 06/25/11 2359   06/14/11 1830  piperacillin-tazobactam (ZOSYN) IVPB 3.375 g       3.375 g 12.5 mL/hr over 240 Minutes Intravenous Every 8 hours 06/14/11 1822     06/14/11 1200   vancomycin (VANCOCIN) 500 mg in sodium chloride 0.9 % 100 mL IVPB  Status:  Discontinued        500 mg 100 mL/hr over 60 Minutes Intravenous Every 12 hours 06/14/11 0444 06/16/11 0311   06/13/11 2330   vancomycin (VANCOCIN) IVPB 1000 mg/200  mL premix        1,000 mg 200 mL/hr over 60 Minutes Intravenous  Once 06/13/11 2316 06/14/11 0030          Assessment: Patient with low vancomycin level 6.6.  Called lab about "missing" vancomycin level.  Per lab the 2300 5/21 level is the 6.6 that shows in results as collected at 0300 5/21.  Lab is to fix this collection time data.  Goal of Therapy:  Vancomycin trough level 15-20 mcg/ml  Plan:  Measure antibiotic drug levels at steady state Follow up culture results Change to 1gm iv q12hr, next dose at [address], Jacquenette Shone Crow[place] 06/16/2011,3:11 AM

## 2011-06-16 NOTE — Progress Notes (Signed)
[facility] [employer].  NO I AND  D AT THIS POINT

## 2011-06-16 NOTE — Discharge Summary (Signed)
Physician Discharge Summary  Patient ID: Craig Freeman MRN: [id number] DOB/AGE: [birth year] 76 y.o.  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 He was admitted with fever and left buttock abscess with induration and pain. Problem #1: left buttock abscess with induration and pain, with a fever of 103 normal Ribas count. Patient was started on IV antibiotics Zosyn and vancomycin, he had local incision and drainage in the emergency room with some drainage of the abscess. Surgery was consulted the patient's for surgical I&D; patient was made n.p.o. And his Coumadin was held but after a day of antibiotics his abscess not improving, he did not require any surgical incision and drainage. Continued IV antibiotics his abscess improved no fevers Dame count remained stable. We will continue on p.o. Antibiotics Augmentin for 10 days with dressing changes at home. Pain control adequate. Problem #2: hypokalemia because of the diuretics and hypertension medication patient was had potassium replacement Pressure by the time of discharge 3.3, his potassium was adjusted and we'll monitor outpatient. Problem 3: hypertension continue on his current medications. Blood pressure remained stable. Problem #4: reactive airway continue on home medication. Problem #5: chronic phlebitis and chronic Coumadin his Coumadin was held for one to 2 days and he had low dose of vitamin K his INR was down to 1.25, he'll receive Coumadin 10 mg before discharge and recheck the Coumadin at the office in 2 days. Home health for nursing; and dressing changes. Followup in one  week  Consults: general surgery  Significant Diagnostic Studies: labs: blood counts remain normal blood chemistries showed low potassium of 2.9 replaced potassium at time of discharge 3.3, normal renal function., microbiology: blood culture: negative and radiology: CXR: normal  Treatments: IV hydration and antibiotics: vancomycin and Zosyn  Discharge Exam: Blood pressure 132/73, pulse 78, temperature 97.8 F (36.6 C), temperature source Oral, resp. rate 18, height 5\' 5"  (1.651 m), weight 68.04 kg (150 lb), SpO2 98.00%. General appearance: alert Resp: clear to auscultation bilaterally Cardio: regular rate and rhythm Skin: Skin color, texture, turgor normal. No rashes or lesions or left buttock abscess smaller in size with mild discomfort; much improved.  Disposition:  home with home nurse  Discharge Orders    Future Orders Please Complete By Expires   Diet - low sodium heart healthy      Diet - low sodium heart healthy      Diet - low sodium heart healthy      Increase activity slowly      Discharge instructions      Comments:   Left buttock wound care; dressing change daily   Increase activity slowly      Discharge instructions      Comments:   HHN/RN for left buttock abscess   Increase activity slowly      Discharge instructions      Comments:   HHN/RN for Left buttock Abscess     Medication List  As of 06/16/2011  8:24 AM   TAKE these medications         acetaminophen 500 MG tablet   Commonly known as: TYLENOL   Take 500 mg by mouth every 6 (six) hours as needed. Fever  albuterol 108 (90 BASE) MCG/ACT inhaler   Commonly known as: PROVENTIL HFA;VENTOLIN HFA   Inhale 2 puffs into the lungs every 6 (six) hours as needed. Shortness of breath      amoxicillin-clavulanate 875-125 MG per tablet   Commonly known as: AUGMENTIN   Take 1 tablet by mouth 2 (two) times daily.      atenolol-chlorthalidone 50-25 MG per tablet   Commonly known as: TENORETIC   Take 1 tablet  by mouth daily.      atorvastatin 40 MG tablet   Commonly known as: LIPITOR   Take 40 mg by mouth daily.      diclofenac sodium 1 % Gel   Commonly known as: VOLTAREN   Apply 1 application topically 4 (four) times daily. Apply to upper joint extremities      docusate sodium 100 MG capsule   Commonly known as: COLACE   Take 100 mg by mouth 2 (two) times daily.      ferrous sulfate 325 (65 FE) MG tablet   Take 325 mg by mouth daily with breakfast.      finasteride 5 MG tablet   Commonly known as: PROSCAR   Take 5 mg by mouth daily.      Fluticasone-Salmeterol 100-50 MCG/DOSE Aepb   Commonly known as: ADVAIR   Inhale 1 puff into the lungs every 12 (twelve) hours. Shortness of breath      mometasone 50 MCG/ACT nasal spray   Commonly known as: NASONEX   Place 2 sprays into the nose daily.      polyethylene glycol packet   Commonly known as: MIRALAX / GLYCOLAX   Take 17 g by mouth 2 (two) times daily.      potassium chloride 10 MEQ tablet   Commonly known as: K-DUR   Take 2 tablets (20 mEq total) by mouth 2 (two) times daily.      senna 8.6 MG tablet   Commonly known as: SENOKOT   Take 1 tablet by mouth daily.      traMADol 50 MG tablet   Commonly known as: ULTRAM   Take 50 mg by mouth every 8 (eight) hours as needed. Pain      triazolam 0.125 MG tablet   Commonly known as: HALCION   Take 0.125 mg by mouth at bedtime as needed. Insomnia      warfarin 5 MG tablet   Commonly known as: COUMADIN   Take 5 mg by mouth daily.           Follow-up Information    Follow up with Mallory Schaad, MD in 2 days. (for Coumadin check at office.)    Contact information:   [address] [place] [address] [phone]         Discharge planning , total time: 35 min. SignedGeorgann Housekeeper 06/16/2011, 8:24 AM

## 2011-06-16 NOTE — Progress Notes (Signed)
  Subjective: Doing much better lying on back.  Objective: Vital signs in last 24 hours: Temp:  [97.1 F (36.2 C)-99.1 F (37.3 C)] 97.8 F (36.6 C) (05/22 0610) Pulse Rate:  [67-78] 78  (05/22 0610) Resp:  [18] 18  (05/22 0610) BP: (114-132)/(60-73) 132/73 mmHg (05/22 0610) SpO2:  [97 %-100 %] 99 % (05/22 0903) Last BM Date: 06/15/11  Intake/Output from previous day: 05/21 0701 - 05/22 0700 In: 1710 [P.O.:1560; IV Piggyback:150] Out: 1500 [Urine:1500] Intake/Output this shift: Total I/O In: 240 [P.O.:240] Out: -   Skin: Buttocks abscess is soft, non tender, and no induration, marked improvement from 5/20.  Lab Results:   Basename 06/15/11 0409 06/13/11 2340 06/13/11 2330  WBC 8.9 -- 7.7  HGB 14.0 16.3 --  HCT 42.4 48.0 --  PLT 142* -- 171    BMET  Basename 06/16/11 0418 06/15/11 0409  NA 139 139  K 3.3* 2.9*  CL 101 100  CO2 30 29  GLUCOSE 108* 104*  BUN 11 11  CREATININE 1.07 1.04  CALCIUM 9.1 9.2   PT/INR  Basename 06/16/11 0418 06/15/11 0409  LABPROT 15.8* 20.3*  INR 1.23 1.70*     Lab 06/13/11 2330  AST 34  ALT 33  ALKPHOS 55  BILITOT 0.3  PROT 7.3  ALBUMIN 3.9     Lipase  No results found for this basename: lipase     Studies/Results: No results found.  Medications:    . atenolol  50 mg Oral Daily  . atorvastatin  40 mg Oral Daily  . chlorthalidone  12.5 mg Oral Daily  . diclofenac sodium  1 application Topical QID  . docusate sodium  100 mg Oral BID  . ferrous sulfate  325 mg Oral Q breakfast  . finasteride  5 mg Oral Daily  . fluticasone  1 spray Each Nare Daily  . Fluticasone-Salmeterol  1 puff Inhalation Q12H  . piperacillin-tazobactam (ZOSYN)  IV  3.375 g Intravenous Q8H  . polyethylene glycol  17 g Oral BID  . potassium chloride  20 mEq Oral BID  . senna  1 tablet Oral BID  . sodium chloride  3 mL Intravenous Q12H  . vancomycin  1,000 mg Intravenous Q12H  . warfarin  10 mg Oral Once  . warfarin  5 mg Oral  ONCE-1800  . Warfarin - Pharmacist Dosing Inpatient   Does not apply q1800  . DISCONTD: vancomycin  500 mg Intravenous Q12H  . DISCONTD: warfarin  7.5 mg Oral ONCE-1800    Assessment/Plan Left buttock abscess and cellulitis   Plan:  He does not need surgery at this point,  It looks much better.  LOS: 3 days    Craig Freeman 06/16/2011

## 2011-06-16 NOTE — Progress Notes (Signed)
Patient provided with discharge instructions and prescriptions. Patient verbalized understanding. Patient ready for discharge after Zosyn infusion complete.

## 2011-06-16 NOTE — Progress Notes (Signed)
ANTICOAGULATION CONSULT NOTE - Follow Up Consult  Pharmacy Consult for warfarin Indication: hx DVT  No Known Allergies  Patient Measurements: Height: 5\' 5"  (165.1 cm) Weight: 150 lb (68.04 kg) IBW/kg (Calculated) : 61.5   Vital Signs: Temp: 97.8 F (36.6 C) (05/22 0610) Temp src: Oral (05/22 0610) BP: 132/73 mmHg (05/22 0610) Pulse Rate: 78  (05/22 0610)  Labs:  Basename 06/16/11 0418 06/15/11 0409 06/14/11 0530 06/13/11 2340 06/13/11 2330  HGB -- 14.0 -- 16.3 --  HCT -- 42.4 -- 48.0 45.9  PLT -- 142* -- -- 171  APTT -- -- -- -- --  LABPROT 15.8* 20.3* 26.7* -- --  INR 1.23 1.70* 2.42* -- --  HEPARINUNFRC -- -- -- -- --  CREATININE 1.07 1.04 -- 1.10 --  CKTOTAL -- -- -- -- --  CKMB -- -- -- -- --  TROPONINI -- -- -- -- --    Estimated Creatinine Clearance: 43.9 ml/min (by C-G formula based on Cr of 1.07).   Medications:  Scheduled:     . atenolol  50 mg Oral Daily  . atorvastatin  40 mg Oral Daily  . chlorthalidone  12.5 mg Oral Daily  . diclofenac sodium  1 application Topical QID  . docusate sodium  100 mg Oral BID  . ferrous sulfate  325 mg Oral Q breakfast  . finasteride  5 mg Oral Daily  . fluticasone  1 spray Each Nare Daily  . Fluticasone-Salmeterol  1 puff Inhalation Q12H  . piperacillin-tazobactam (ZOSYN)  IV  3.375 g Intravenous Q8H  . polyethylene glycol  17 g Oral BID  . potassium chloride  20 mEq Oral BID  . senna  1 tablet Oral BID  . sodium chloride  3 mL Intravenous Q12H  . vancomycin  1,000 mg Intravenous Q12H  . warfarin  10 mg Oral Once  . warfarin  5 mg Oral ONCE-1800  . Warfarin - Pharmacist Dosing Inpatient   Does not apply q1800  . DISCONTD: vancomycin  500 mg Intravenous Q12H  . DISCONTD: warfarin  7.5 mg Oral ONCE-1800   Infusions:    Assessment:  Per Dr. Luisa Hart : resume warfarin 5/21 due to patient improvement and holding off on I&D   INR remains subtherapeutic and decreased; likely from continued effects of vitamin K  given 5/20  PTA: Warfarin 5mg  Tues/Thurs/Fri and 2.47m on Mon/Wed/Fri  Goal of Therapy:  INR 2-3   Plan:   Recommend warfarin 5mg  today  For discharge, agree with close follow up of INR until returning to therapeutic levels  Recommend return to alternating warfarin doses (5mg  Tues/Thurs/Fri and 2.63m on Mon/Wed/Fri) when INR is therapeutic   Lynann Beaver PharmD, BCPS Pager [phone] 06/16/2011 9:08 AM

## 2011-06-16 NOTE — Progress Notes (Signed)
Received a referral for [facility] RN for wound care, discussed with patient. He did not have a preference for home care and was ok with arranging with [facility]. Contacted Darl Pikes with [facility] to arrange. Awaiting clarification of orders and F2F. Patient for d/c home today after today's antibiotics are received.

## 2011-06-17 DIAGNOSIS — L0231 Cutaneous abscess of buttock: Secondary | ICD-10-CM | POA: Diagnosis not present

## 2011-06-17 DIAGNOSIS — Z8672 Personal history of thrombophlebitis: Secondary | ICD-10-CM | POA: Diagnosis not present

## 2011-06-17 DIAGNOSIS — I10 Essential (primary) hypertension: Secondary | ICD-10-CM | POA: Diagnosis not present

## 2011-06-18 DIAGNOSIS — Z7901 Long term (current) use of anticoagulants: Secondary | ICD-10-CM | POA: Diagnosis not present

## 2011-06-23 DIAGNOSIS — Z7901 Long term (current) use of anticoagulants: Secondary | ICD-10-CM | POA: Diagnosis not present

## 2011-06-23 DIAGNOSIS — I809 Phlebitis and thrombophlebitis of unspecified site: Secondary | ICD-10-CM | POA: Diagnosis not present

## 2011-06-23 DIAGNOSIS — L0231 Cutaneous abscess of buttock: Secondary | ICD-10-CM | POA: Diagnosis not present

## 2011-06-23 DIAGNOSIS — L03317 Cellulitis of buttock: Secondary | ICD-10-CM | POA: Diagnosis not present

## 2011-07-14 DIAGNOSIS — Z7901 Long term (current) use of anticoagulants: Secondary | ICD-10-CM | POA: Diagnosis not present

## 2011-07-16 DIAGNOSIS — I809 Phlebitis and thrombophlebitis of unspecified site: Secondary | ICD-10-CM | POA: Diagnosis not present

## 2011-07-16 DIAGNOSIS — L0231 Cutaneous abscess of buttock: Secondary | ICD-10-CM | POA: Diagnosis not present

## 2011-07-16 DIAGNOSIS — Z7901 Long term (current) use of anticoagulants: Secondary | ICD-10-CM | POA: Diagnosis not present

## 2011-07-16 DIAGNOSIS — L03317 Cellulitis of buttock: Secondary | ICD-10-CM | POA: Diagnosis not present

## 2011-08-11 DIAGNOSIS — Z7901 Long term (current) use of anticoagulants: Secondary | ICD-10-CM | POA: Diagnosis not present

## 2011-08-27 DIAGNOSIS — L0231 Cutaneous abscess of buttock: Secondary | ICD-10-CM | POA: Diagnosis not present

## 2011-08-27 DIAGNOSIS — L03317 Cellulitis of buttock: Secondary | ICD-10-CM | POA: Diagnosis not present

## 2011-08-31 ENCOUNTER — Other Ambulatory Visit: Payer: Self-pay

## 2011-08-31 DIAGNOSIS — J45909 Unspecified asthma, uncomplicated: Secondary | ICD-10-CM

## 2011-08-31 DIAGNOSIS — L0291 Cutaneous abscess, unspecified: Secondary | ICD-10-CM | POA: Diagnosis not present

## 2011-08-31 DIAGNOSIS — L0390 Cellulitis, unspecified: Secondary | ICD-10-CM | POA: Diagnosis not present

## 2011-09-08 DIAGNOSIS — J45909 Unspecified asthma, uncomplicated: Secondary | ICD-10-CM | POA: Diagnosis not present

## 2011-09-08 DIAGNOSIS — Z7901 Long term (current) use of anticoagulants: Secondary | ICD-10-CM | POA: Diagnosis not present

## 2011-09-08 DIAGNOSIS — K5900 Constipation, unspecified: Secondary | ICD-10-CM | POA: Diagnosis not present

## 2011-09-08 DIAGNOSIS — L0390 Cellulitis, unspecified: Secondary | ICD-10-CM | POA: Diagnosis not present

## 2011-09-08 DIAGNOSIS — N400 Enlarged prostate without lower urinary tract symptoms: Secondary | ICD-10-CM | POA: Diagnosis not present

## 2011-09-08 DIAGNOSIS — K219 Gastro-esophageal reflux disease without esophagitis: Secondary | ICD-10-CM | POA: Diagnosis not present

## 2011-09-08 DIAGNOSIS — E782 Mixed hyperlipidemia: Secondary | ICD-10-CM | POA: Diagnosis not present

## 2011-09-08 DIAGNOSIS — I10 Essential (primary) hypertension: Secondary | ICD-10-CM | POA: Diagnosis not present

## 2011-09-21 ENCOUNTER — Ambulatory Visit (HOSPITAL_COMMUNITY)
Admission: RE | Admit: 2011-09-21 | Discharge: 2011-09-21 | Disposition: A | Discharge status: Home / Self Care | Payer: Medicare Other | Source: Ambulatory Visit | Attending: Internal Medicine | Admitting: Internal Medicine

## 2011-09-21 DIAGNOSIS — J45909 Unspecified asthma, uncomplicated: Secondary | ICD-10-CM | POA: Diagnosis not present

## 2011-09-21 MED ORDER — ALBUTEROL SULFATE (5 MG/ML) 0.5% IN NEBU
2.5000 mg | INHALATION_SOLUTION | Freq: Once | RESPIRATORY_TRACT | Status: AC
  Administered 2011-09-21: 2.5 mg via RESPIRATORY_TRACT

## 2011-09-22 DIAGNOSIS — Z7901 Long term (current) use of anticoagulants: Secondary | ICD-10-CM | POA: Diagnosis not present

## 2011-10-14 DIAGNOSIS — J069 Acute upper respiratory infection, unspecified: Secondary | ICD-10-CM | POA: Diagnosis not present

## 2011-10-14 DIAGNOSIS — J45909 Unspecified asthma, uncomplicated: Secondary | ICD-10-CM | POA: Diagnosis not present

## 2011-10-15 DIAGNOSIS — N401 Enlarged prostate with lower urinary tract symptoms: Secondary | ICD-10-CM | POA: Diagnosis not present

## 2011-10-15 DIAGNOSIS — R310 Gross hematuria: Secondary | ICD-10-CM | POA: Diagnosis not present

## 2011-10-15 DIAGNOSIS — R300 Dysuria: Secondary | ICD-10-CM | POA: Diagnosis not present

## 2011-10-15 DIAGNOSIS — R82998 Other abnormal findings in urine: Secondary | ICD-10-CM | POA: Diagnosis not present

## 2011-10-21 DIAGNOSIS — R310 Gross hematuria: Secondary | ICD-10-CM | POA: Diagnosis not present

## 2011-10-21 DIAGNOSIS — Z7901 Long term (current) use of anticoagulants: Secondary | ICD-10-CM | POA: Diagnosis not present

## 2011-10-21 DIAGNOSIS — N401 Enlarged prostate with lower urinary tract symptoms: Secondary | ICD-10-CM | POA: Diagnosis not present

## 2011-10-25 DIAGNOSIS — Z7901 Long term (current) use of anticoagulants: Secondary | ICD-10-CM | POA: Diagnosis not present

## 2011-11-08 DIAGNOSIS — Z23 Encounter for immunization: Secondary | ICD-10-CM | POA: Diagnosis not present

## 2011-11-08 DIAGNOSIS — Z7901 Long term (current) use of anticoagulants: Secondary | ICD-10-CM | POA: Diagnosis not present

## 2011-11-22 DIAGNOSIS — Z7901 Long term (current) use of anticoagulants: Secondary | ICD-10-CM | POA: Diagnosis not present

## 2011-12-22 DIAGNOSIS — Z7901 Long term (current) use of anticoagulants: Secondary | ICD-10-CM | POA: Diagnosis not present

## 2012-01-17 DIAGNOSIS — Z7901 Long term (current) use of anticoagulants: Secondary | ICD-10-CM | POA: Diagnosis not present

## 2012-01-31 DIAGNOSIS — Z7901 Long term (current) use of anticoagulants: Secondary | ICD-10-CM | POA: Diagnosis not present

## 2012-02-14 DIAGNOSIS — Z7901 Long term (current) use of anticoagulants: Secondary | ICD-10-CM | POA: Diagnosis not present

## 2012-02-24 DIAGNOSIS — K5900 Constipation, unspecified: Secondary | ICD-10-CM | POA: Diagnosis not present

## 2012-03-13 DIAGNOSIS — Z7901 Long term (current) use of anticoagulants: Secondary | ICD-10-CM | POA: Diagnosis not present

## 2012-03-20 DIAGNOSIS — Z1331 Encounter for screening for depression: Secondary | ICD-10-CM | POA: Diagnosis not present

## 2012-03-20 DIAGNOSIS — K5900 Constipation, unspecified: Secondary | ICD-10-CM | POA: Diagnosis not present

## 2012-03-20 DIAGNOSIS — K219 Gastro-esophageal reflux disease without esophagitis: Secondary | ICD-10-CM | POA: Diagnosis not present

## 2012-03-20 DIAGNOSIS — N400 Enlarged prostate without lower urinary tract symptoms: Secondary | ICD-10-CM | POA: Diagnosis not present

## 2012-03-20 DIAGNOSIS — J45909 Unspecified asthma, uncomplicated: Secondary | ICD-10-CM | POA: Diagnosis not present

## 2012-03-20 DIAGNOSIS — E782 Mixed hyperlipidemia: Secondary | ICD-10-CM | POA: Diagnosis not present

## 2012-03-20 DIAGNOSIS — Z7901 Long term (current) use of anticoagulants: Secondary | ICD-10-CM | POA: Diagnosis not present

## 2012-03-20 DIAGNOSIS — Z0000 Encounter for general adult medical examination without abnormal findings: Secondary | ICD-10-CM | POA: Diagnosis not present

## 2012-03-20 DIAGNOSIS — I10 Essential (primary) hypertension: Secondary | ICD-10-CM | POA: Diagnosis not present

## 2012-03-20 DIAGNOSIS — R7309 Other abnormal glucose: Secondary | ICD-10-CM | POA: Diagnosis not present

## 2012-04-17 DIAGNOSIS — Z7901 Long term (current) use of anticoagulants: Secondary | ICD-10-CM | POA: Diagnosis not present

## 2012-05-03 ENCOUNTER — Other Ambulatory Visit: Payer: Self-pay | Admitting: Internal Medicine

## 2012-05-03 DIAGNOSIS — R109 Unspecified abdominal pain: Secondary | ICD-10-CM | POA: Diagnosis not present

## 2012-05-03 DIAGNOSIS — K4090 Unilateral inguinal hernia, without obstruction or gangrene, not specified as recurrent: Secondary | ICD-10-CM

## 2012-05-04 ENCOUNTER — Other Ambulatory Visit: Payer: 59

## 2012-05-08 DIAGNOSIS — Z7901 Long term (current) use of anticoagulants: Secondary | ICD-10-CM | POA: Diagnosis not present

## 2012-05-17 ENCOUNTER — Ambulatory Visit
Admission: RE | Admit: 2012-05-17 | Discharge: 2012-05-17 | Disposition: A | Discharge status: Home / Self Care | Payer: Medicare Other | Source: Ambulatory Visit | Attending: Internal Medicine | Admitting: Internal Medicine

## 2012-05-17 DIAGNOSIS — K4090 Unilateral inguinal hernia, without obstruction or gangrene, not specified as recurrent: Secondary | ICD-10-CM

## 2012-05-17 DIAGNOSIS — N281 Cyst of kidney, acquired: Secondary | ICD-10-CM | POA: Diagnosis not present

## 2012-05-17 MED ORDER — IOHEXOL 300 MG/ML  SOLN
100.0000 mL | Freq: Once | INTRAMUSCULAR | Status: AC | PRN
  Administered 2012-05-17: 100 mL via INTRAVENOUS

## 2012-05-22 DIAGNOSIS — Z7901 Long term (current) use of anticoagulants: Secondary | ICD-10-CM | POA: Diagnosis not present

## 2012-05-30 DIAGNOSIS — H01009 Unspecified blepharitis unspecified eye, unspecified eyelid: Secondary | ICD-10-CM | POA: Diagnosis not present

## 2012-05-30 DIAGNOSIS — S058X9A Other injuries of unspecified eye and orbit, initial encounter: Secondary | ICD-10-CM | POA: Diagnosis not present

## 2012-05-30 DIAGNOSIS — H40009 Preglaucoma, unspecified, unspecified eye: Secondary | ICD-10-CM | POA: Diagnosis not present

## 2012-05-30 DIAGNOSIS — H04129 Dry eye syndrome of unspecified lacrimal gland: Secondary | ICD-10-CM | POA: Diagnosis not present

## 2012-06-20 DIAGNOSIS — Z7901 Long term (current) use of anticoagulants: Secondary | ICD-10-CM | POA: Diagnosis not present

## 2012-07-18 DIAGNOSIS — Z7901 Long term (current) use of anticoagulants: Secondary | ICD-10-CM | POA: Diagnosis not present

## 2012-08-15 DIAGNOSIS — Z7901 Long term (current) use of anticoagulants: Secondary | ICD-10-CM | POA: Diagnosis not present

## 2012-09-19 DIAGNOSIS — I10 Essential (primary) hypertension: Secondary | ICD-10-CM | POA: Diagnosis not present

## 2012-09-19 DIAGNOSIS — G4700 Insomnia, unspecified: Secondary | ICD-10-CM | POA: Diagnosis not present

## 2012-09-19 DIAGNOSIS — E782 Mixed hyperlipidemia: Secondary | ICD-10-CM | POA: Diagnosis not present

## 2012-09-19 DIAGNOSIS — I82409 Acute embolism and thrombosis of unspecified deep veins of unspecified lower extremity: Secondary | ICD-10-CM | POA: Diagnosis not present

## 2012-09-19 DIAGNOSIS — K219 Gastro-esophageal reflux disease without esophagitis: Secondary | ICD-10-CM | POA: Diagnosis not present

## 2012-09-19 DIAGNOSIS — Z23 Encounter for immunization: Secondary | ICD-10-CM | POA: Diagnosis not present

## 2012-09-19 DIAGNOSIS — Z7901 Long term (current) use of anticoagulants: Secondary | ICD-10-CM | POA: Diagnosis not present

## 2012-10-17 DIAGNOSIS — Z7901 Long term (current) use of anticoagulants: Secondary | ICD-10-CM | POA: Diagnosis not present

## 2012-10-20 DIAGNOSIS — N401 Enlarged prostate with lower urinary tract symptoms: Secondary | ICD-10-CM | POA: Diagnosis not present

## 2012-11-14 DIAGNOSIS — Z7901 Long term (current) use of anticoagulants: Secondary | ICD-10-CM | POA: Diagnosis not present

## 2012-11-14 DIAGNOSIS — Z23 Encounter for immunization: Secondary | ICD-10-CM | POA: Diagnosis not present

## 2012-11-14 DIAGNOSIS — I82409 Acute embolism and thrombosis of unspecified deep veins of unspecified lower extremity: Secondary | ICD-10-CM | POA: Diagnosis not present

## 2012-12-12 DIAGNOSIS — Z7901 Long term (current) use of anticoagulants: Secondary | ICD-10-CM | POA: Diagnosis not present

## 2012-12-12 DIAGNOSIS — I82409 Acute embolism and thrombosis of unspecified deep veins of unspecified lower extremity: Secondary | ICD-10-CM | POA: Diagnosis not present

## 2012-12-17 IMAGING — CR DG CHEST 1V PORT
1 series · 1 of 1 positions shown · non-contrast
Comparison: 03/27/2010

CLINICAL DATA: Fever, weakness.

PORTABLE CHEST - 1 VIEW

[AP]
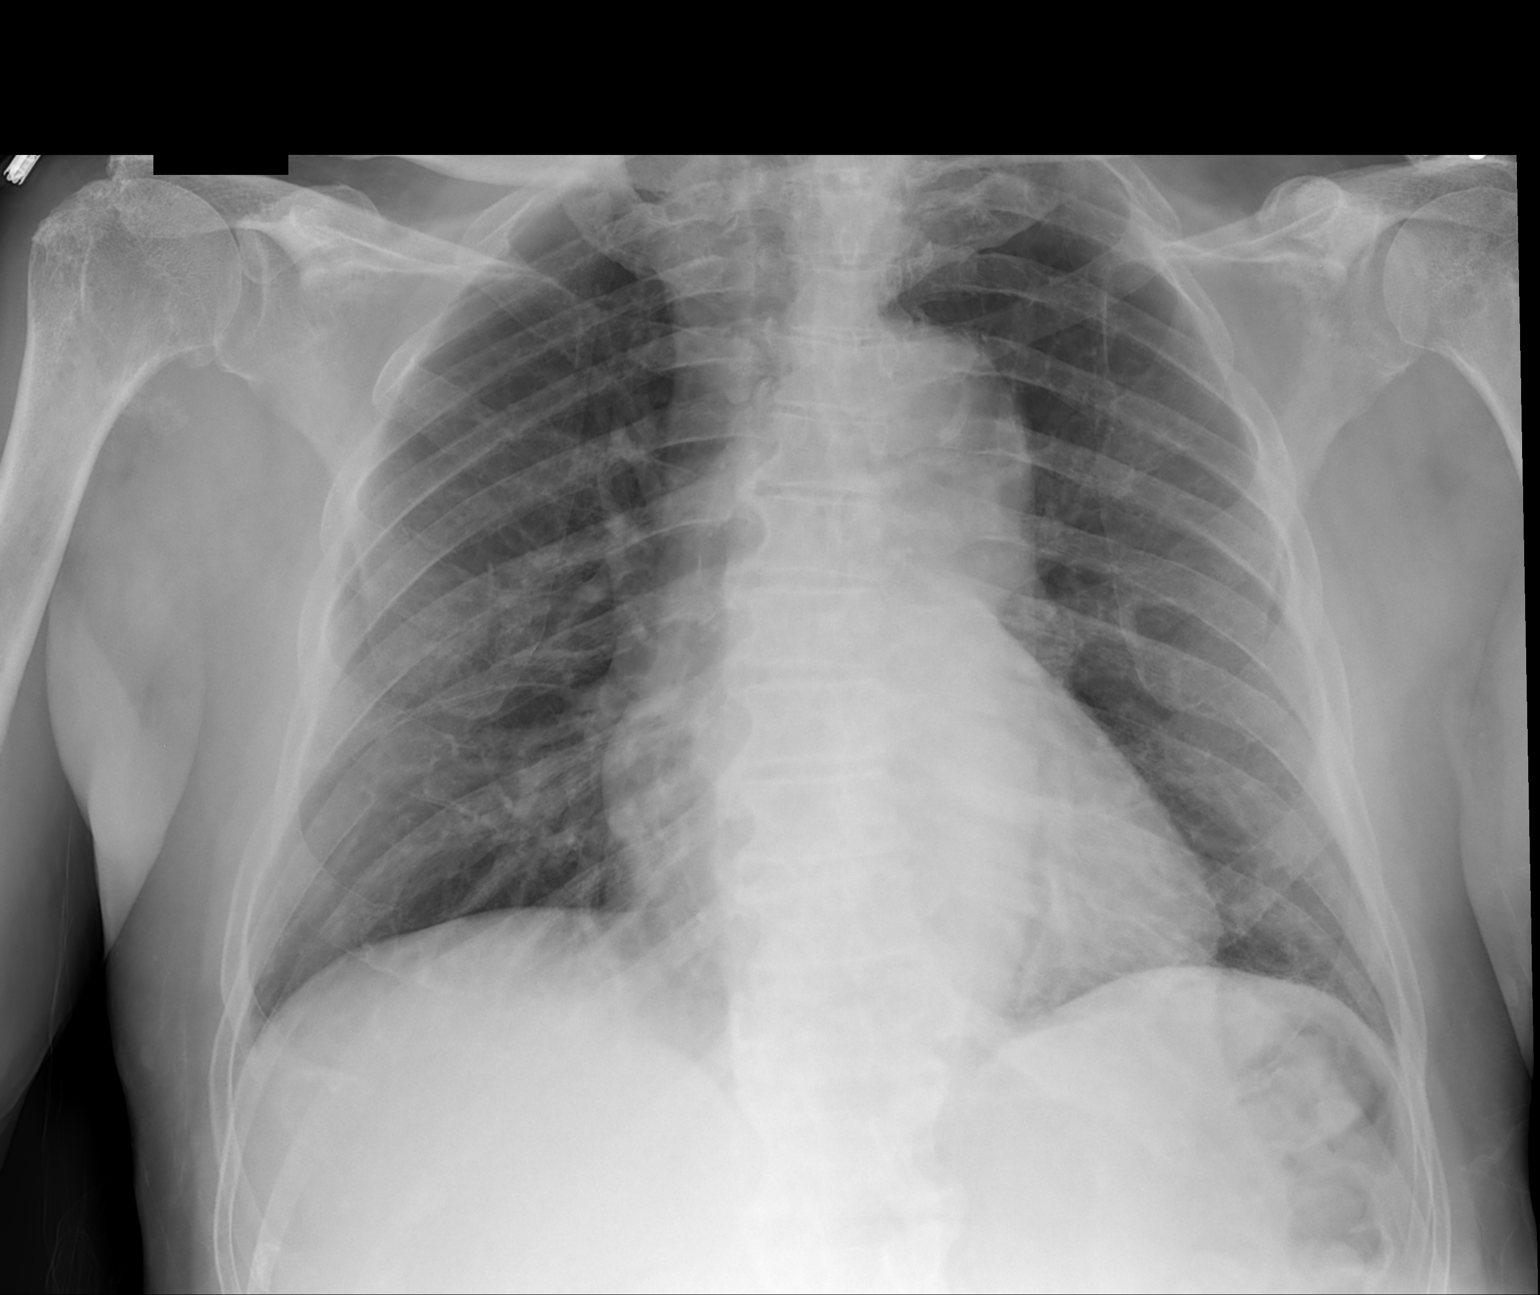

[1 of 1 positions shown; findings below may reference images not displayed]

FINDINGS: Allowing for differences in technique, cardiomediastinal
contours are unchanged, upper normal limits.  There is mild
interstitial prominence / bronchial wall thickening.  No focal
consolidation.  No pleural effusion or pneumothorax.  High-riding
humeral heads with degenerative changes. Multilevel vertebral
degenerative changes.  No acute osseous finding.
IMPRESSION: Bronchitic changes without focal consolidation.

## 2013-01-09 DIAGNOSIS — Z7901 Long term (current) use of anticoagulants: Secondary | ICD-10-CM | POA: Diagnosis not present

## 2013-01-09 DIAGNOSIS — I82409 Acute embolism and thrombosis of unspecified deep veins of unspecified lower extremity: Secondary | ICD-10-CM | POA: Diagnosis not present

## 2013-02-06 DIAGNOSIS — I82409 Acute embolism and thrombosis of unspecified deep veins of unspecified lower extremity: Secondary | ICD-10-CM | POA: Diagnosis not present

## 2013-02-06 DIAGNOSIS — Z7901 Long term (current) use of anticoagulants: Secondary | ICD-10-CM | POA: Diagnosis not present

## 2013-02-13 DIAGNOSIS — R1031 Right lower quadrant pain: Secondary | ICD-10-CM | POA: Diagnosis not present

## 2013-02-13 DIAGNOSIS — R21 Rash and other nonspecific skin eruption: Secondary | ICD-10-CM | POA: Diagnosis not present

## 2013-02-14 ENCOUNTER — Encounter: Payer: Self-pay | Admitting: Podiatry

## 2013-02-14 ENCOUNTER — Ambulatory Visit (INDEPENDENT_AMBULATORY_CARE_PROVIDER_SITE_OTHER): Payer: Medicare Other | Admitting: Podiatry

## 2013-02-14 VITALS — BP 122/58 | HR 56 | Resp 16 | Ht 64.0 in | Wt 150.0 lb

## 2013-02-14 DIAGNOSIS — M2040 Other hammer toe(s) (acquired), unspecified foot: Secondary | ICD-10-CM | POA: Diagnosis not present

## 2013-02-14 DIAGNOSIS — L84 Corns and callosities: Secondary | ICD-10-CM | POA: Diagnosis not present

## 2013-02-14 DIAGNOSIS — M779 Enthesopathy, unspecified: Secondary | ICD-10-CM

## 2013-02-14 MED ORDER — TRIAMCINOLONE ACETONIDE 10 MG/ML IJ SUSP
10.0000 mg | Freq: Once | INTRAMUSCULAR | Status: AC
  Administered 2013-02-14: 10 mg

## 2013-02-14 NOTE — Progress Notes (Signed)
   Subjective:    Patient ID: Craig Freeman, male    DOB: [birth year], 80 y.o.   MRN: [id number]  HPI Comments: "I have these corns and scaly feet"  Pt has a corn 4th toe right medial side and callus plantar forefoot right for several months just becoming more painful. He keeps padded with corn pads. Shoes makes toes rub together and makes tender.     Review of Systems  Musculoskeletal: Positive for arthralgias and gait problem.  All other systems reviewed and are negative.       Objective:   Physical Exam        Assessment & Plan:

## 2013-02-14 NOTE — Progress Notes (Signed)
Subjective:     Patient ID: Craig Freeman, male   DOB: 1924-04-05, 80 y.o.   MRN: [id number]  HPI patient presents stating I have a painful corn between my third and fourth toe on my right foot and underneath. States he can not wear shoe gear with any degree of difficulty and this is been going on for a while   Review of Systems  All other systems reviewed and are negative.       Objective:   Physical Exam  Nursing note and vitals reviewed. Constitutional: He is oriented to person, place, and time.  Cardiovascular: Intact distal pulses.   Musculoskeletal: Normal range of motion.  Neurological: He is oriented to person, place, and time.  Skin: Skin is warm.   neurovascular status unchanged and intact with no equinus condition and some muscle strength loss noted. Of the extensors and flexors. I noted there to be a very painful keratotic lesion fourth toe right foot medial side with fluid buildup at the interphalangeal joint and keratotic lesion plantarly a long was structural bunion deformity and hammertoe deformity     Assessment:     Structural deformity with exostosis hammertoe deformity and capsulitis along with keratotic lesion formation    Plan:     H&P and x-ray reviewed and condition discussed. Today careful interphalangeal joint injection 2 mg [place] and 2 mg Xylocaine debridement of lesion and padding was accomplished. May require surgery which I explained the patient

## 2013-03-08 DIAGNOSIS — Z5181 Encounter for therapeutic drug level monitoring: Secondary | ICD-10-CM | POA: Diagnosis not present

## 2013-03-08 DIAGNOSIS — Z7901 Long term (current) use of anticoagulants: Secondary | ICD-10-CM | POA: Diagnosis not present

## 2013-03-30 DIAGNOSIS — Z1331 Encounter for screening for depression: Secondary | ICD-10-CM | POA: Diagnosis not present

## 2013-03-30 DIAGNOSIS — R0602 Shortness of breath: Secondary | ICD-10-CM | POA: Diagnosis not present

## 2013-03-30 DIAGNOSIS — J45909 Unspecified asthma, uncomplicated: Secondary | ICD-10-CM | POA: Diagnosis not present

## 2013-04-05 DIAGNOSIS — I82409 Acute embolism and thrombosis of unspecified deep veins of unspecified lower extremity: Secondary | ICD-10-CM | POA: Diagnosis not present

## 2013-04-05 DIAGNOSIS — Z7901 Long term (current) use of anticoagulants: Secondary | ICD-10-CM | POA: Diagnosis not present

## 2013-04-17 DIAGNOSIS — Z7901 Long term (current) use of anticoagulants: Secondary | ICD-10-CM | POA: Diagnosis not present

## 2013-04-17 DIAGNOSIS — I82409 Acute embolism and thrombosis of unspecified deep veins of unspecified lower extremity: Secondary | ICD-10-CM | POA: Diagnosis not present

## 2013-04-24 DIAGNOSIS — Z1331 Encounter for screening for depression: Secondary | ICD-10-CM | POA: Diagnosis not present

## 2013-04-24 DIAGNOSIS — K219 Gastro-esophageal reflux disease without esophagitis: Secondary | ICD-10-CM | POA: Diagnosis not present

## 2013-04-24 DIAGNOSIS — I82409 Acute embolism and thrombosis of unspecified deep veins of unspecified lower extremity: Secondary | ICD-10-CM | POA: Diagnosis not present

## 2013-04-24 DIAGNOSIS — I10 Essential (primary) hypertension: Secondary | ICD-10-CM | POA: Diagnosis not present

## 2013-04-24 DIAGNOSIS — R7309 Other abnormal glucose: Secondary | ICD-10-CM | POA: Diagnosis not present

## 2013-04-24 DIAGNOSIS — G4700 Insomnia, unspecified: Secondary | ICD-10-CM | POA: Diagnosis not present

## 2013-04-24 DIAGNOSIS — Z0000 Encounter for general adult medical examination without abnormal findings: Secondary | ICD-10-CM | POA: Diagnosis not present

## 2013-04-24 DIAGNOSIS — Z23 Encounter for immunization: Secondary | ICD-10-CM | POA: Diagnosis not present

## 2013-04-24 DIAGNOSIS — J45909 Unspecified asthma, uncomplicated: Secondary | ICD-10-CM | POA: Diagnosis not present

## 2013-04-24 DIAGNOSIS — E782 Mixed hyperlipidemia: Secondary | ICD-10-CM | POA: Diagnosis not present

## 2013-04-24 DIAGNOSIS — D649 Anemia, unspecified: Secondary | ICD-10-CM | POA: Diagnosis not present

## 2013-05-01 DIAGNOSIS — Z7901 Long term (current) use of anticoagulants: Secondary | ICD-10-CM | POA: Diagnosis not present

## 2013-05-01 DIAGNOSIS — I82409 Acute embolism and thrombosis of unspecified deep veins of unspecified lower extremity: Secondary | ICD-10-CM | POA: Diagnosis not present

## 2013-05-29 DIAGNOSIS — R209 Unspecified disturbances of skin sensation: Secondary | ICD-10-CM | POA: Diagnosis not present

## 2013-05-29 DIAGNOSIS — I82409 Acute embolism and thrombosis of unspecified deep veins of unspecified lower extremity: Secondary | ICD-10-CM | POA: Diagnosis not present

## 2013-05-29 DIAGNOSIS — Z7901 Long term (current) use of anticoagulants: Secondary | ICD-10-CM | POA: Diagnosis not present

## 2013-06-06 DIAGNOSIS — H04129 Dry eye syndrome of unspecified lacrimal gland: Secondary | ICD-10-CM | POA: Diagnosis not present

## 2013-06-06 DIAGNOSIS — H40019 Open angle with borderline findings, low risk, unspecified eye: Secondary | ICD-10-CM | POA: Diagnosis not present

## 2013-06-06 DIAGNOSIS — Z961 Presence of intraocular lens: Secondary | ICD-10-CM | POA: Diagnosis not present

## 2013-06-06 DIAGNOSIS — H40009 Preglaucoma, unspecified, unspecified eye: Secondary | ICD-10-CM | POA: Diagnosis not present

## 2013-06-26 DIAGNOSIS — I82409 Acute embolism and thrombosis of unspecified deep veins of unspecified lower extremity: Secondary | ICD-10-CM | POA: Diagnosis not present

## 2013-06-26 DIAGNOSIS — Z7901 Long term (current) use of anticoagulants: Secondary | ICD-10-CM | POA: Diagnosis not present

## 2013-07-10 DIAGNOSIS — M719 Bursopathy, unspecified: Secondary | ICD-10-CM | POA: Diagnosis not present

## 2013-07-10 DIAGNOSIS — M67919 Unspecified disorder of synovium and tendon, unspecified shoulder: Secondary | ICD-10-CM | POA: Diagnosis not present

## 2013-07-24 DIAGNOSIS — Z7901 Long term (current) use of anticoagulants: Secondary | ICD-10-CM | POA: Diagnosis not present

## 2013-07-24 DIAGNOSIS — I82409 Acute embolism and thrombosis of unspecified deep veins of unspecified lower extremity: Secondary | ICD-10-CM | POA: Diagnosis not present

## 2013-08-15 DIAGNOSIS — M1710 Unilateral primary osteoarthritis, unspecified knee: Secondary | ICD-10-CM | POA: Diagnosis not present

## 2013-08-21 DIAGNOSIS — I82409 Acute embolism and thrombosis of unspecified deep veins of unspecified lower extremity: Secondary | ICD-10-CM | POA: Diagnosis not present

## 2013-08-21 DIAGNOSIS — Z7901 Long term (current) use of anticoagulants: Secondary | ICD-10-CM | POA: Diagnosis not present

## 2013-09-18 DIAGNOSIS — I82409 Acute embolism and thrombosis of unspecified deep veins of unspecified lower extremity: Secondary | ICD-10-CM | POA: Diagnosis not present

## 2013-09-18 DIAGNOSIS — Z7901 Long term (current) use of anticoagulants: Secondary | ICD-10-CM | POA: Diagnosis not present

## 2013-10-16 DIAGNOSIS — I82409 Acute embolism and thrombosis of unspecified deep veins of unspecified lower extremity: Secondary | ICD-10-CM | POA: Diagnosis not present

## 2013-10-16 DIAGNOSIS — Z7901 Long term (current) use of anticoagulants: Secondary | ICD-10-CM | POA: Diagnosis not present

## 2013-10-25 DIAGNOSIS — Z1389 Encounter for screening for other disorder: Secondary | ICD-10-CM | POA: Diagnosis not present

## 2013-10-25 DIAGNOSIS — E780 Pure hypercholesterolemia: Secondary | ICD-10-CM | POA: Diagnosis not present

## 2013-10-25 DIAGNOSIS — Z23 Encounter for immunization: Secondary | ICD-10-CM | POA: Diagnosis not present

## 2013-10-25 DIAGNOSIS — I10 Essential (primary) hypertension: Secondary | ICD-10-CM | POA: Diagnosis not present

## 2013-10-25 DIAGNOSIS — J309 Allergic rhinitis, unspecified: Secondary | ICD-10-CM | POA: Diagnosis not present

## 2013-10-25 DIAGNOSIS — R7309 Other abnormal glucose: Secondary | ICD-10-CM | POA: Diagnosis not present

## 2013-10-25 DIAGNOSIS — J4520 Mild intermittent asthma, uncomplicated: Secondary | ICD-10-CM | POA: Diagnosis not present

## 2013-10-25 DIAGNOSIS — N182 Chronic kidney disease, stage 2 (mild): Secondary | ICD-10-CM | POA: Diagnosis not present

## 2013-10-25 DIAGNOSIS — I825Z1 Chronic embolism and thrombosis of unspecified deep veins of right distal lower extremity: Secondary | ICD-10-CM | POA: Diagnosis not present

## 2013-10-26 DIAGNOSIS — R350 Frequency of micturition: Secondary | ICD-10-CM | POA: Diagnosis not present

## 2013-10-26 DIAGNOSIS — N401 Enlarged prostate with lower urinary tract symptoms: Secondary | ICD-10-CM | POA: Diagnosis not present

## 2013-11-05 DIAGNOSIS — L03119 Cellulitis of unspecified part of limb: Secondary | ICD-10-CM | POA: Diagnosis not present

## 2013-11-05 DIAGNOSIS — L820 Inflamed seborrheic keratosis: Secondary | ICD-10-CM | POA: Diagnosis not present

## 2013-11-13 DIAGNOSIS — Z7901 Long term (current) use of anticoagulants: Secondary | ICD-10-CM | POA: Diagnosis not present

## 2013-11-13 DIAGNOSIS — I825Z1 Chronic embolism and thrombosis of unspecified deep veins of right distal lower extremity: Secondary | ICD-10-CM | POA: Diagnosis not present

## 2013-11-21 IMAGING — CT CT ABD-PELV W/ CM
3 of 5 series · 12 of 36 positions shown, 18 images · IV contrast (omnipaque)
Comparison: None.

CLINICAL DATA: Right inguinal bulge L5 exam.  Status post previous
right side of the inguinal hernia repair.

CT ABDOMEN AND PELVIS WITH CONTRAST
TECHNIQUE: Multidetector CT imaging of the abdomen and pelvis was
performed following the standard protocol during bolus
administration of intravenous contrast.
Contrast: 100mL OMNIPAQUE IOHEXOL 300 MG/ML  SOLN

[Series 3: abd/pelvis with · axial · 0.70mm/px · z∈[-377,-77]mm · 8 of 78 slices shown, 13 images]
[im 9/78  soft-tissue]
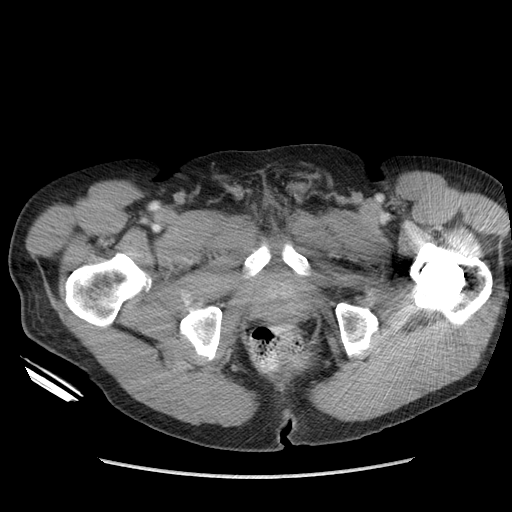
[im 9/78  bone]
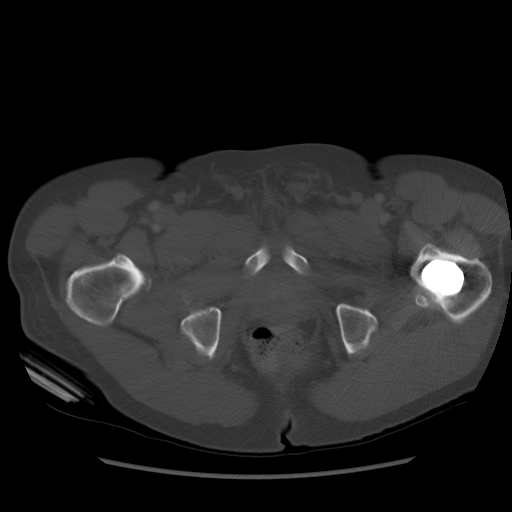
[im 18/78  soft-tissue]
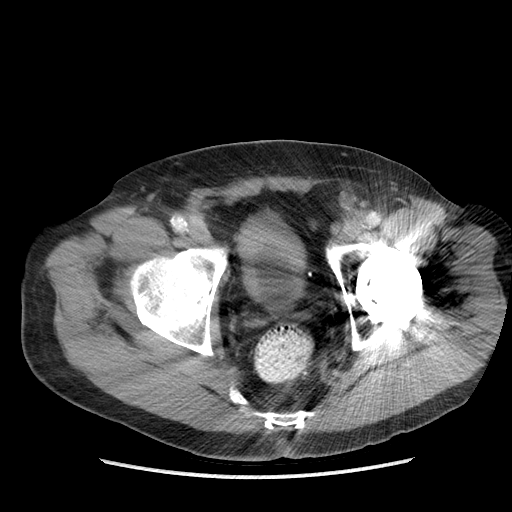
[im 26/78  soft-tissue]
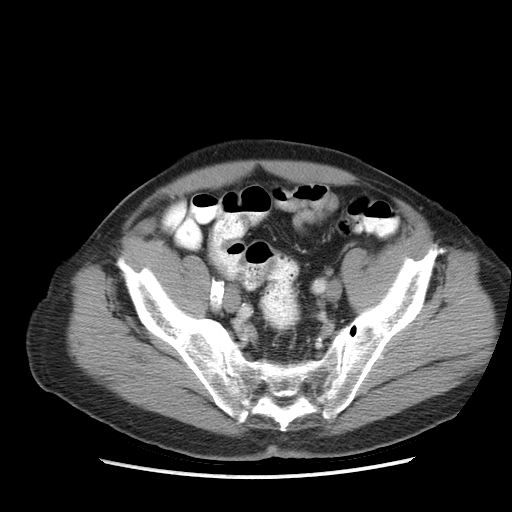
[im 35/78  soft-tissue]
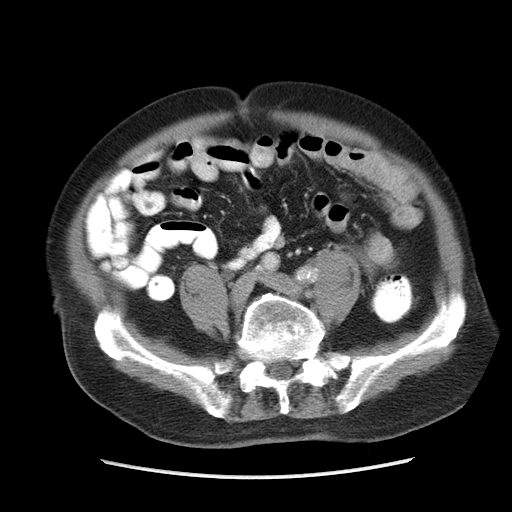
[im 43/78  soft-tissue]
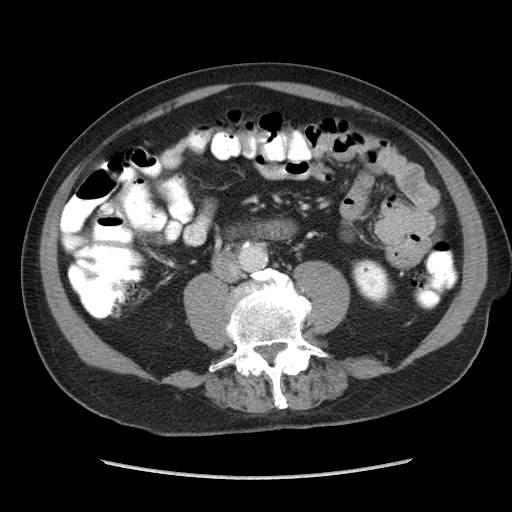
[im 43/78  lung]
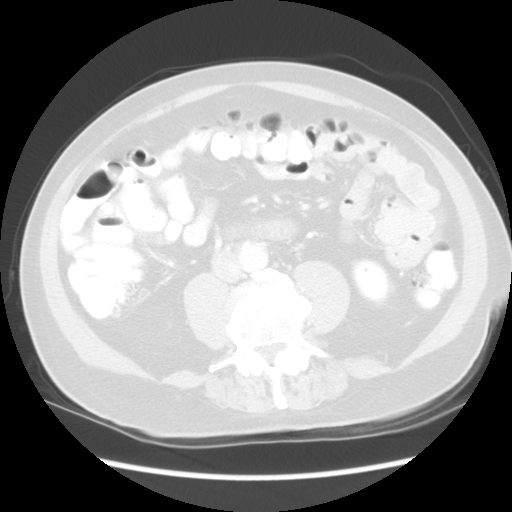
[im 52/78  soft-tissue]
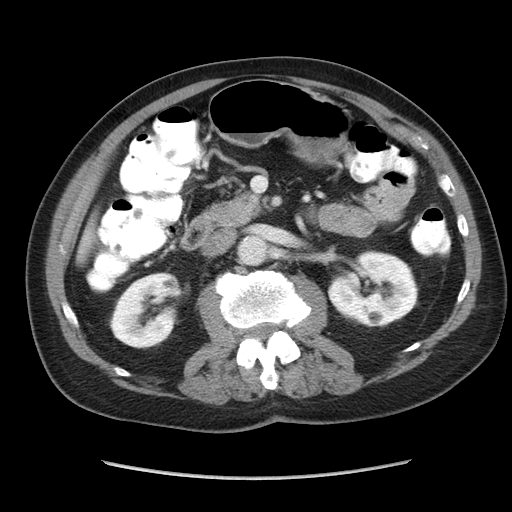
[im 52/78  lung]
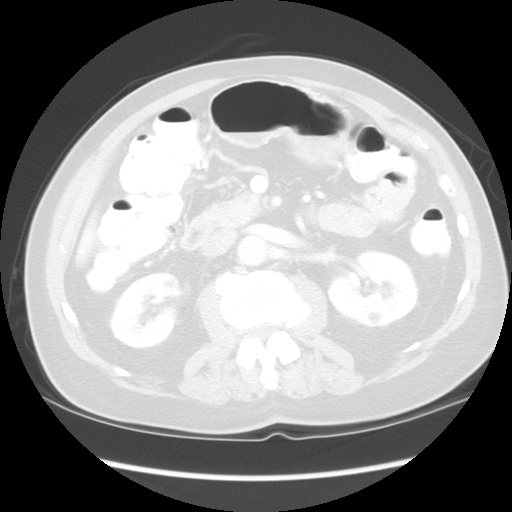
[im 60/78  soft-tissue]
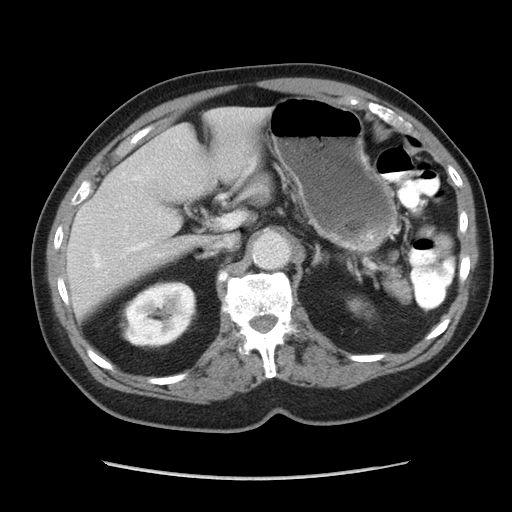
[im 60/78  lung]
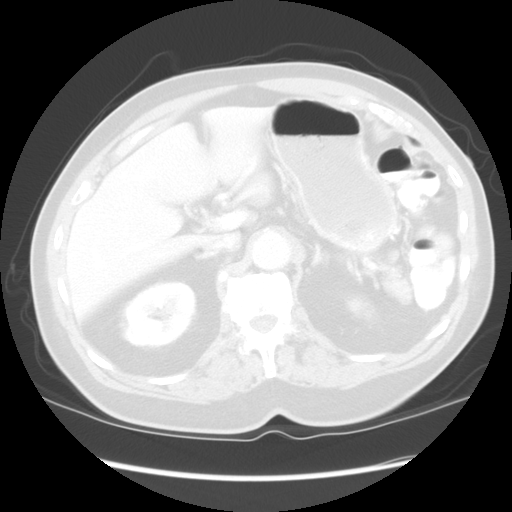
[im 69/78  soft-tissue]
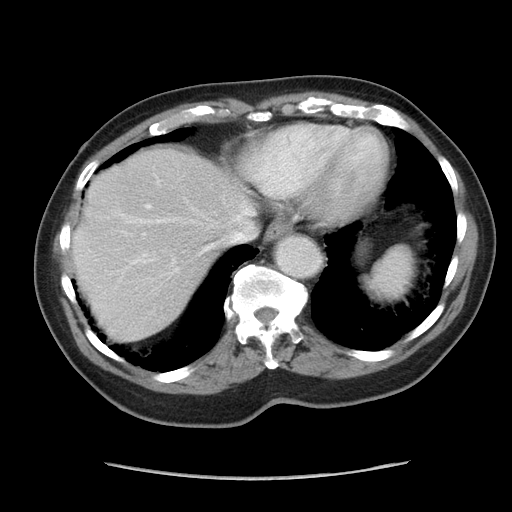
[im 69/78  lung]
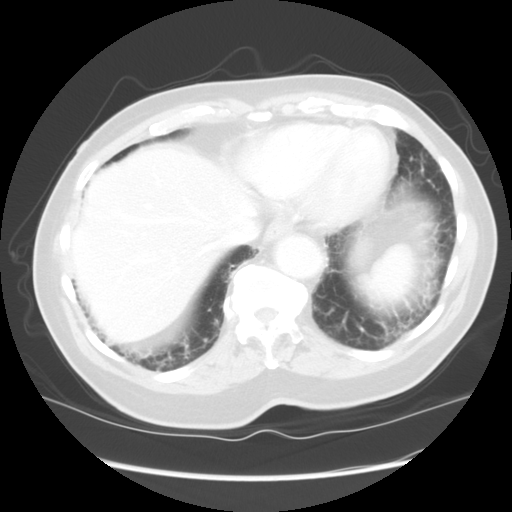

[Series 601: coronal body · coronal · 0.83mm/px · 1 of 117 slices shown, 2 images]
[im 39/117  soft-tissue]
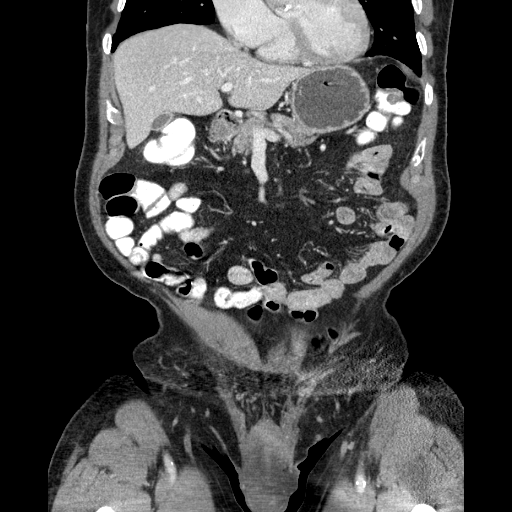
[im 39/117  bone]
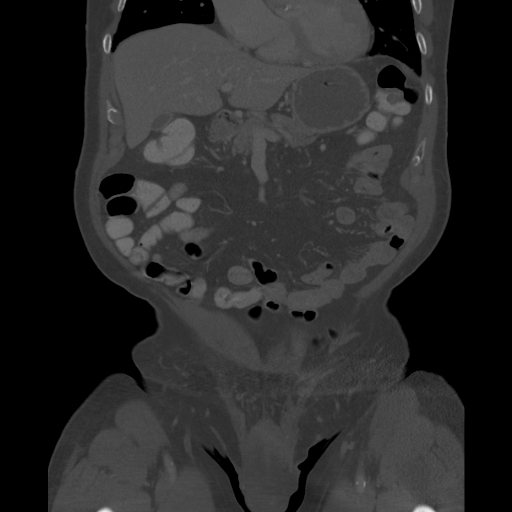

[Series 602: sagittal body · sagittal · 0.83mm/px · 3 of 145 slices shown]
[im 10/145  soft-tissue]
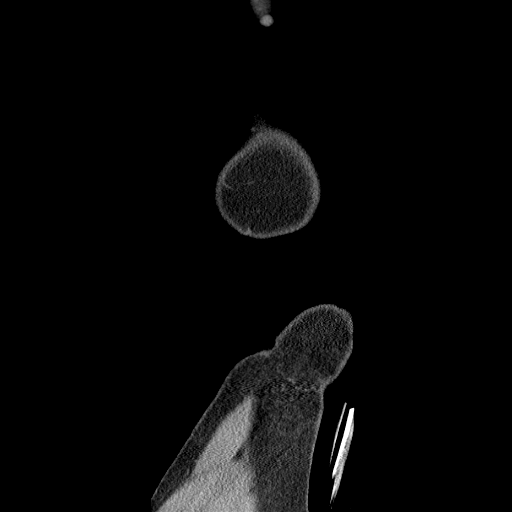
[im 28/145  soft-tissue]
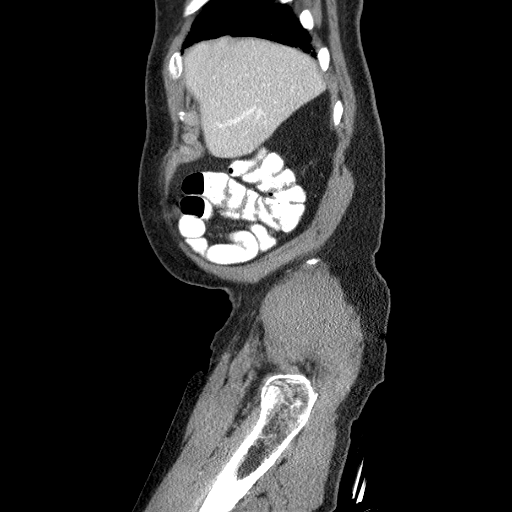
[im 46/145  soft-tissue]
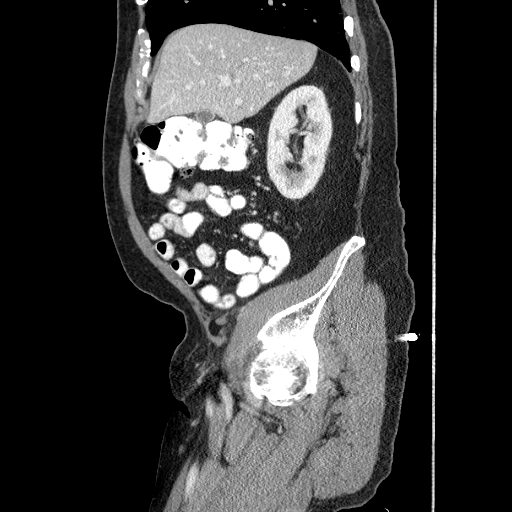

[12 of 36 positions shown; findings below may reference images not displayed]

FINDINGS: The heart size is normal.  Scarring or atelectasis is
present at the lung bases bilaterally.  There is some early
honeycombing on the right.  No significant pleural or pericardial
effusion is present.

The liver and spleen are within normal limits.  The stomach,
duodenum, and pancreas are unremarkable.  Common bile duct is
within normal limits.  The gallbladder is mildly contracted.  The
adrenal glands are normal bilaterally.  Sub centimeter renal cysts
are present bilaterally.  A 9.5 mm exophytic low-density lesion at
the lower pole of the left kidney is likely a cyst as well.  The
ureters and urinary bladder are within normal limits.

The rectosigmoid colon is within normal limits.  The remainder of
the colon is unremarkable.  The appendix is visualized and normal.
No significant adenopathy or free fluid is present.

Atherosclerotic calcifications are evident without to a significant
aneurysm.

No significant residual or recurrent inguinal hernia is evident.
No significant inguinal lymph nodes are present.

Bone windows demonstrate ankylosis at L4-5.  There is slight
degenerative anterolisthesis at L3-4.  The vertebral body heights
and alignment are otherwise normal.  The pelvis is intact.  The
patient is status post right total left total hip arthroplasty.
Moderate degenerative changes noted at the right hip.
IMPRESSION: 1.  No evidence for residual or recurrent right-sided inguinal
hernia.
2.  Atherosclerotic changes without evidence for aneurysm.
3.  Bilateral renal cysts appear benign.
4.  Ankylosis at L4-5 with degenerative anterolisthesis at L3-4.

## 2013-12-11 DIAGNOSIS — Z7901 Long term (current) use of anticoagulants: Secondary | ICD-10-CM | POA: Diagnosis not present

## 2013-12-11 DIAGNOSIS — I825Z1 Chronic embolism and thrombosis of unspecified deep veins of right distal lower extremity: Secondary | ICD-10-CM | POA: Diagnosis not present

## 2014-01-08 DIAGNOSIS — Z7901 Long term (current) use of anticoagulants: Secondary | ICD-10-CM | POA: Diagnosis not present

## 2014-01-08 DIAGNOSIS — I82721 Chronic embolism and thrombosis of deep veins of right upper extremity: Secondary | ICD-10-CM | POA: Diagnosis not present

## 2014-01-23 ENCOUNTER — Encounter: Payer: Self-pay | Admitting: *Deleted

## 2014-02-05 DIAGNOSIS — Z7901 Long term (current) use of anticoagulants: Secondary | ICD-10-CM | POA: Diagnosis not present

## 2014-02-05 DIAGNOSIS — I82721 Chronic embolism and thrombosis of deep veins of right upper extremity: Secondary | ICD-10-CM | POA: Diagnosis not present

## 2014-02-27 DIAGNOSIS — R197 Diarrhea, unspecified: Secondary | ICD-10-CM | POA: Diagnosis not present

## 2014-03-05 DIAGNOSIS — I82721 Chronic embolism and thrombosis of deep veins of right upper extremity: Secondary | ICD-10-CM | POA: Diagnosis not present

## 2014-03-05 DIAGNOSIS — Z7901 Long term (current) use of anticoagulants: Secondary | ICD-10-CM | POA: Diagnosis not present

## 2014-03-18 DIAGNOSIS — J309 Allergic rhinitis, unspecified: Secondary | ICD-10-CM | POA: Diagnosis not present

## 2014-03-18 DIAGNOSIS — Z7901 Long term (current) use of anticoagulants: Secondary | ICD-10-CM | POA: Diagnosis not present

## 2014-04-02 DIAGNOSIS — Z7901 Long term (current) use of anticoagulants: Secondary | ICD-10-CM | POA: Diagnosis not present

## 2014-04-02 DIAGNOSIS — I82721 Chronic embolism and thrombosis of deep veins of right upper extremity: Secondary | ICD-10-CM | POA: Diagnosis not present

## 2014-04-29 DIAGNOSIS — Z7901 Long term (current) use of anticoagulants: Secondary | ICD-10-CM | POA: Diagnosis not present

## 2014-04-30 DIAGNOSIS — N182 Chronic kidney disease, stage 2 (mild): Secondary | ICD-10-CM | POA: Diagnosis not present

## 2014-04-30 DIAGNOSIS — J309 Allergic rhinitis, unspecified: Secondary | ICD-10-CM | POA: Diagnosis not present

## 2014-04-30 DIAGNOSIS — G4700 Insomnia, unspecified: Secondary | ICD-10-CM | POA: Diagnosis not present

## 2014-04-30 DIAGNOSIS — E780 Pure hypercholesterolemia: Secondary | ICD-10-CM | POA: Diagnosis not present

## 2014-04-30 DIAGNOSIS — I10 Essential (primary) hypertension: Secondary | ICD-10-CM | POA: Diagnosis not present

## 2014-04-30 DIAGNOSIS — K219 Gastro-esophageal reflux disease without esophagitis: Secondary | ICD-10-CM | POA: Diagnosis not present

## 2014-04-30 DIAGNOSIS — N400 Enlarged prostate without lower urinary tract symptoms: Secondary | ICD-10-CM | POA: Diagnosis not present

## 2014-04-30 DIAGNOSIS — I825Z1 Chronic embolism and thrombosis of unspecified deep veins of right distal lower extremity: Secondary | ICD-10-CM | POA: Diagnosis not present

## 2014-04-30 DIAGNOSIS — Z1389 Encounter for screening for other disorder: Secondary | ICD-10-CM | POA: Diagnosis not present

## 2014-04-30 DIAGNOSIS — Z0000 Encounter for general adult medical examination without abnormal findings: Secondary | ICD-10-CM | POA: Diagnosis not present

## 2014-04-30 DIAGNOSIS — R7309 Other abnormal glucose: Secondary | ICD-10-CM | POA: Diagnosis not present

## 2014-05-25 ENCOUNTER — Emergency Department (INDEPENDENT_AMBULATORY_CARE_PROVIDER_SITE_OTHER)
Admission: EM | Admit: 2014-05-25 | Discharge: 2014-05-25 | Disposition: A | Discharge status: Home / Self Care | Payer: Medicare Other | Source: Home / Self Care | Attending: Family Medicine | Admitting: Family Medicine

## 2014-05-25 ENCOUNTER — Encounter (HOSPITAL_COMMUNITY): Payer: Self-pay | Admitting: Emergency Medicine

## 2014-05-25 DIAGNOSIS — H11002 Unspecified pterygium of left eye: Secondary | ICD-10-CM

## 2014-05-25 MED ORDER — TETRACAINE HCL 0.5 % OP SOLN
OPHTHALMIC | Status: AC
  Filled 2014-05-25: qty 2

## 2014-05-25 NOTE — ED Provider Notes (Signed)
CSN: [id number]     Arrival date & time 05/25/14  1017 History   None    Chief Complaint  Patient presents with  . Eye Problem   (Consider location/radiation/quality/duration/timing/severity/associated sxs/prior Treatment) HPI     81 year old male presents complaining of an area of erythema and discomfort in his left eye. He first noticed this yesterday. He has made an appointment with his eye doctor for Tuesday of this coming week but he wanted to be checked out in the meantime to make sure there is nothing seriously wrong. The discomfort is mild. He denies any systemic symptoms. Denies any visual changes or drainage from the eye. No itching or pain. He describes the discomfort as a mild feeling of a foreign body in the left eye  Past Medical History  Diagnosis Date  . Hypertension   . Hyperlipidemia   . DVT (deep venous thrombosis), right     coumadin  . Constipation    Past Surgical History  Procedure Laterality Date  . Joint replacement     No family history on file. History  Substance Use Topics  . Smoking status: Never Smoker   . Smokeless tobacco: Not on file  . Alcohol Use: No    Review of Systems  Eyes: Positive for pain (see history of present illness).  All other systems reviewed and are negative.   Allergies  Review of patient's allergies indicates no known allergies.  Home Medications   Prior to Admission medications   Medication Sig Start Date End Date Taking? Authorizing Provider  acetaminophen (TYLENOL) 500 MG tablet Take 500 mg by mouth every 6 (six) hours as needed. Fever    Historical Provider, MD  albuterol (PROVENTIL HFA;VENTOLIN HFA) 108 (90 BASE) MCG/ACT inhaler Inhale 2 puffs into the lungs every 6 (six) hours as needed. Shortness of breath    Historical Provider, MD  atenolol-chlorthalidone (TENORETIC) 50-25 MG per tablet Take 1 tablet by mouth daily. 06/16/11   Wenda Low, MD  atorvastatin (LIPITOR) 40 MG tablet Take 40 mg by mouth daily.     Historical Provider, MD  chlorthalidone (HYGROTON) 25 MG tablet Take 25 mg by mouth daily.    Historical Provider, MD  clotrimazole-betamethasone (LOTRISONE) cream Apply 1 application topically 2 (two) times daily.    Historical Provider, MD  docusate sodium (COLACE) 100 MG capsule Take 100 mg by mouth 2 (two) times daily.    Historical Provider, MD  Famotidine (PEPCID PO) Take by mouth.    Historical Provider, MD  ferrous sulfate 325 (65 FE) MG tablet Take 325 mg by mouth daily with breakfast.    Historical Provider, MD  finasteride (PROSCAR) 5 MG tablet Take 5 mg by mouth daily.    Historical Provider, MD  Fluticasone-Salmeterol (ADVAIR) 100-50 MCG/DOSE AEPB Inhale 1 puff into the lungs every 12 (twelve) hours. Shortness of breath    Historical Provider, MD  hydrocortisone (ANUSOL-HC) 25 MG suppository Place 25 mg rectally 2 (two) times daily.    Historical Provider, MD  polyethylene glycol (MIRALAX / GLYCOLAX) packet Take 17 g by mouth 2 (two) times daily.    Historical Provider, MD  potassium chloride (K-DUR) 10 MEQ tablet Take 2 tablets (20 mEq total) by mouth 2 (two) times daily. 06/16/11   Wenda Low, MD  senna (SENOKOT) 8.6 MG tablet Take 1 tablet by mouth daily.    Historical Provider, MD  traMADol (ULTRAM) 50 MG tablet Take 50 mg by mouth every 8 (eight) hours as needed. Pain  Historical Provider, MD  triazolam (HALCION) 0.125 MG tablet Take 0.125 mg by mouth at bedtime as needed. Insomnia    Historical Provider, MD  warfarin (COUMADIN) 5 MG tablet Take 5 mg by mouth daily.    Historical Provider, MD   BP 144/83 mmHg  Pulse 79  Temp(Src) 96.9 F (36.1 C) (Oral)  Resp 18  SpO2 97% Physical Exam  Constitutional: He is oriented to person, place, and time. He appears well-developed and well-nourished. No distress.  HENT:  Head: Normocephalic.  Eyes: EOM and lids are normal. Pupils are equal, round, and reactive to light. Lids are everted and swept, no foreign bodies found.  Slit  lamp exam:      The left eye shows no corneal abrasion, no corneal flare, no corneal ulcer and no fluorescein uptake.  There is a fleshy growth over the medial conjunctiva of the left eye  Pulmonary/Chest: Effort normal. No respiratory distress.  Neurological: He is alert and oriented to person, place, and time. Coordination normal.  Skin: Skin is warm and dry. No rash noted. He is not diaphoretic.  Psychiatric: He has a normal mood and affect. Judgment normal.  Nursing note and vitals reviewed.  Visual acuity is 20/50 bilaterally  ED Course  Procedures (including critical care time) Labs Review Labs Reviewed - No data to display  Imaging Review No results found.   MDM   1. Pterygium, left    I believe he has a pterygium. I discussed this with him, no treatment indicated. He'll follow-up with his eye doctors as previously scheduled for confirmation.    Liam Graham, PA-C 05/25/14 [place] Pattie Flaharty, PA-C 05/25/14 1116

## 2014-05-25 NOTE — Discharge Instructions (Signed)
You have what is called a pterygium, follow-up with your eye doctor on Tuesday as previously scheduled. See attached sheet for more information.

## 2014-05-25 NOTE — ED Notes (Signed)
Left eye is red, bloodshot.  No pain, no drainage, no vision changes, no itchiness, no tearing.  Wears reading glasses.

## 2014-05-28 DIAGNOSIS — Z7901 Long term (current) use of anticoagulants: Secondary | ICD-10-CM | POA: Diagnosis not present

## 2014-05-31 DIAGNOSIS — H5340 Unspecified visual field defects: Secondary | ICD-10-CM | POA: Diagnosis not present

## 2014-05-31 DIAGNOSIS — H18413 Arcus senilis, bilateral: Secondary | ICD-10-CM | POA: Diagnosis not present

## 2014-05-31 DIAGNOSIS — H00015 Hordeolum externum left lower eyelid: Secondary | ICD-10-CM | POA: Diagnosis not present

## 2014-05-31 DIAGNOSIS — H40023 Open angle with borderline findings, high risk, bilateral: Secondary | ICD-10-CM | POA: Diagnosis not present

## 2014-05-31 DIAGNOSIS — Z9849 Cataract extraction status, unspecified eye: Secondary | ICD-10-CM | POA: Diagnosis not present

## 2014-05-31 DIAGNOSIS — H11153 Pinguecula, bilateral: Secondary | ICD-10-CM | POA: Diagnosis not present

## 2014-06-11 DIAGNOSIS — Z7901 Long term (current) use of anticoagulants: Secondary | ICD-10-CM | POA: Diagnosis not present

## 2014-07-16 DIAGNOSIS — Z7901 Long term (current) use of anticoagulants: Secondary | ICD-10-CM | POA: Diagnosis not present

## 2014-08-13 DIAGNOSIS — Z7901 Long term (current) use of anticoagulants: Secondary | ICD-10-CM | POA: Diagnosis not present

## 2014-09-10 DIAGNOSIS — Z7901 Long term (current) use of anticoagulants: Secondary | ICD-10-CM | POA: Diagnosis not present

## 2014-10-08 DIAGNOSIS — Z7901 Long term (current) use of anticoagulants: Secondary | ICD-10-CM | POA: Diagnosis not present

## 2014-10-22 DIAGNOSIS — Z7901 Long term (current) use of anticoagulants: Secondary | ICD-10-CM | POA: Diagnosis not present

## 2014-11-04 DIAGNOSIS — K5900 Constipation, unspecified: Secondary | ICD-10-CM | POA: Diagnosis not present

## 2014-11-04 DIAGNOSIS — I825Z1 Chronic embolism and thrombosis of unspecified deep veins of right distal lower extremity: Secondary | ICD-10-CM | POA: Diagnosis not present

## 2014-11-04 DIAGNOSIS — N529 Male erectile dysfunction, unspecified: Secondary | ICD-10-CM | POA: Diagnosis not present

## 2014-11-04 DIAGNOSIS — R7309 Other abnormal glucose: Secondary | ICD-10-CM | POA: Diagnosis not present

## 2014-11-04 DIAGNOSIS — I10 Essential (primary) hypertension: Secondary | ICD-10-CM | POA: Diagnosis not present

## 2014-11-04 DIAGNOSIS — K219 Gastro-esophageal reflux disease without esophagitis: Secondary | ICD-10-CM | POA: Diagnosis not present

## 2014-11-04 DIAGNOSIS — N182 Chronic kidney disease, stage 2 (mild): Secondary | ICD-10-CM | POA: Diagnosis not present

## 2014-11-04 DIAGNOSIS — Z23 Encounter for immunization: Secondary | ICD-10-CM | POA: Diagnosis not present

## 2014-11-04 DIAGNOSIS — N400 Enlarged prostate without lower urinary tract symptoms: Secondary | ICD-10-CM | POA: Diagnosis not present

## 2014-11-04 DIAGNOSIS — G4700 Insomnia, unspecified: Secondary | ICD-10-CM | POA: Diagnosis not present

## 2014-11-04 DIAGNOSIS — E7800 Pure hypercholesterolemia, unspecified: Secondary | ICD-10-CM | POA: Diagnosis not present

## 2014-11-08 DIAGNOSIS — N401 Enlarged prostate with lower urinary tract symptoms: Secondary | ICD-10-CM | POA: Diagnosis not present

## 2014-11-08 DIAGNOSIS — N138 Other obstructive and reflux uropathy: Secondary | ICD-10-CM | POA: Diagnosis not present

## 2014-11-08 DIAGNOSIS — R3912 Poor urinary stream: Secondary | ICD-10-CM | POA: Diagnosis not present

## 2014-11-12 DIAGNOSIS — Z7901 Long term (current) use of anticoagulants: Secondary | ICD-10-CM | POA: Diagnosis not present

## 2014-11-26 DIAGNOSIS — Z7901 Long term (current) use of anticoagulants: Secondary | ICD-10-CM | POA: Diagnosis not present

## 2014-12-02 DIAGNOSIS — H40013 Open angle with borderline findings, low risk, bilateral: Secondary | ICD-10-CM | POA: Diagnosis not present

## 2014-12-02 DIAGNOSIS — H3589 Other specified retinal disorders: Secondary | ICD-10-CM | POA: Diagnosis not present

## 2014-12-02 DIAGNOSIS — Z961 Presence of intraocular lens: Secondary | ICD-10-CM | POA: Diagnosis not present

## 2014-12-24 DIAGNOSIS — Z7901 Long term (current) use of anticoagulants: Secondary | ICD-10-CM | POA: Diagnosis not present

## 2015-01-21 DIAGNOSIS — Z7901 Long term (current) use of anticoagulants: Secondary | ICD-10-CM | POA: Diagnosis not present

## 2015-02-18 DIAGNOSIS — Z7901 Long term (current) use of anticoagulants: Secondary | ICD-10-CM | POA: Diagnosis not present

## 2015-03-10 DIAGNOSIS — J069 Acute upper respiratory infection, unspecified: Secondary | ICD-10-CM | POA: Diagnosis not present

## 2015-03-18 DIAGNOSIS — Z7901 Long term (current) use of anticoagulants: Secondary | ICD-10-CM | POA: Diagnosis not present

## 2015-03-25 DIAGNOSIS — J019 Acute sinusitis, unspecified: Secondary | ICD-10-CM | POA: Diagnosis not present

## 2015-04-01 DIAGNOSIS — Z7901 Long term (current) use of anticoagulants: Secondary | ICD-10-CM | POA: Diagnosis not present

## 2015-04-15 DIAGNOSIS — Z7901 Long term (current) use of anticoagulants: Secondary | ICD-10-CM | POA: Diagnosis not present

## 2015-05-06 DIAGNOSIS — N182 Chronic kidney disease, stage 2 (mild): Secondary | ICD-10-CM | POA: Diagnosis not present

## 2015-05-06 DIAGNOSIS — Z1389 Encounter for screening for other disorder: Secondary | ICD-10-CM | POA: Diagnosis not present

## 2015-05-06 DIAGNOSIS — R7303 Prediabetes: Secondary | ICD-10-CM | POA: Diagnosis not present

## 2015-05-06 DIAGNOSIS — I10 Essential (primary) hypertension: Secondary | ICD-10-CM | POA: Diagnosis not present

## 2015-05-06 DIAGNOSIS — E7800 Pure hypercholesterolemia, unspecified: Secondary | ICD-10-CM | POA: Diagnosis not present

## 2015-05-06 DIAGNOSIS — Z0000 Encounter for general adult medical examination without abnormal findings: Secondary | ICD-10-CM | POA: Diagnosis not present

## 2015-05-06 DIAGNOSIS — D696 Thrombocytopenia, unspecified: Secondary | ICD-10-CM | POA: Diagnosis not present

## 2015-05-06 DIAGNOSIS — R739 Hyperglycemia, unspecified: Secondary | ICD-10-CM | POA: Diagnosis not present

## 2015-05-06 DIAGNOSIS — I825Z1 Chronic embolism and thrombosis of unspecified deep veins of right distal lower extremity: Secondary | ICD-10-CM | POA: Diagnosis not present

## 2015-05-06 DIAGNOSIS — J309 Allergic rhinitis, unspecified: Secondary | ICD-10-CM | POA: Diagnosis not present

## 2015-05-06 DIAGNOSIS — K5900 Constipation, unspecified: Secondary | ICD-10-CM | POA: Diagnosis not present

## 2015-05-06 DIAGNOSIS — N400 Enlarged prostate without lower urinary tract symptoms: Secondary | ICD-10-CM | POA: Diagnosis not present

## 2015-06-03 DIAGNOSIS — Z7901 Long term (current) use of anticoagulants: Secondary | ICD-10-CM | POA: Diagnosis not present

## 2015-07-01 DIAGNOSIS — Z7901 Long term (current) use of anticoagulants: Secondary | ICD-10-CM | POA: Diagnosis not present

## 2015-07-30 DIAGNOSIS — Z7901 Long term (current) use of anticoagulants: Secondary | ICD-10-CM | POA: Diagnosis not present

## 2015-08-26 DIAGNOSIS — Z7901 Long term (current) use of anticoagulants: Secondary | ICD-10-CM | POA: Diagnosis not present

## 2015-09-01 DIAGNOSIS — M79674 Pain in right toe(s): Secondary | ICD-10-CM | POA: Diagnosis not present

## 2015-09-24 DIAGNOSIS — Z7901 Long term (current) use of anticoagulants: Secondary | ICD-10-CM | POA: Diagnosis not present

## 2015-10-21 DIAGNOSIS — Z7901 Long term (current) use of anticoagulants: Secondary | ICD-10-CM | POA: Diagnosis not present

## 2015-11-05 DIAGNOSIS — I10 Essential (primary) hypertension: Secondary | ICD-10-CM | POA: Diagnosis not present

## 2015-11-05 DIAGNOSIS — J309 Allergic rhinitis, unspecified: Secondary | ICD-10-CM | POA: Diagnosis not present

## 2015-11-05 DIAGNOSIS — R7303 Prediabetes: Secondary | ICD-10-CM | POA: Diagnosis not present

## 2015-11-05 DIAGNOSIS — I825Z1 Chronic embolism and thrombosis of unspecified deep veins of right distal lower extremity: Secondary | ICD-10-CM | POA: Diagnosis not present

## 2015-11-05 DIAGNOSIS — Z23 Encounter for immunization: Secondary | ICD-10-CM | POA: Diagnosis not present

## 2015-11-05 DIAGNOSIS — N182 Chronic kidney disease, stage 2 (mild): Secondary | ICD-10-CM | POA: Diagnosis not present

## 2015-11-05 DIAGNOSIS — N400 Enlarged prostate without lower urinary tract symptoms: Secondary | ICD-10-CM | POA: Diagnosis not present

## 2015-11-05 DIAGNOSIS — E7800 Pure hypercholesterolemia, unspecified: Secondary | ICD-10-CM | POA: Diagnosis not present

## 2015-11-05 DIAGNOSIS — D696 Thrombocytopenia, unspecified: Secondary | ICD-10-CM | POA: Diagnosis not present

## 2015-11-18 DIAGNOSIS — Z7901 Long term (current) use of anticoagulants: Secondary | ICD-10-CM | POA: Diagnosis not present

## 2015-12-01 DIAGNOSIS — R351 Nocturia: Secondary | ICD-10-CM | POA: Diagnosis not present

## 2015-12-01 DIAGNOSIS — N401 Enlarged prostate with lower urinary tract symptoms: Secondary | ICD-10-CM | POA: Diagnosis not present

## 2015-12-16 DIAGNOSIS — Z7901 Long term (current) use of anticoagulants: Secondary | ICD-10-CM | POA: Diagnosis not present

## 2015-12-25 DIAGNOSIS — M48061 Spinal stenosis, lumbar region without neurogenic claudication: Secondary | ICD-10-CM | POA: Diagnosis not present

## 2015-12-25 DIAGNOSIS — M1612 Unilateral primary osteoarthritis, left hip: Secondary | ICD-10-CM | POA: Diagnosis not present

## 2015-12-25 DIAGNOSIS — M1712 Unilateral primary osteoarthritis, left knee: Secondary | ICD-10-CM | POA: Diagnosis not present

## 2016-01-13 DIAGNOSIS — Z7901 Long term (current) use of anticoagulants: Secondary | ICD-10-CM | POA: Diagnosis not present

## 2016-01-21 DIAGNOSIS — K5901 Slow transit constipation: Secondary | ICD-10-CM | POA: Diagnosis not present

## 2016-02-10 DIAGNOSIS — Z7901 Long term (current) use of anticoagulants: Secondary | ICD-10-CM | POA: Diagnosis not present

## 2016-03-09 DIAGNOSIS — Z7901 Long term (current) use of anticoagulants: Secondary | ICD-10-CM | POA: Diagnosis not present

## 2016-04-06 DIAGNOSIS — Z7901 Long term (current) use of anticoagulants: Secondary | ICD-10-CM | POA: Diagnosis not present

## 2016-05-12 DIAGNOSIS — R972 Elevated prostate specific antigen [PSA]: Secondary | ICD-10-CM | POA: Diagnosis not present

## 2016-05-12 DIAGNOSIS — N400 Enlarged prostate without lower urinary tract symptoms: Secondary | ICD-10-CM | POA: Diagnosis not present

## 2016-05-12 DIAGNOSIS — N182 Chronic kidney disease, stage 2 (mild): Secondary | ICD-10-CM | POA: Diagnosis not present

## 2016-05-12 DIAGNOSIS — D696 Thrombocytopenia, unspecified: Secondary | ICD-10-CM | POA: Diagnosis not present

## 2016-05-12 DIAGNOSIS — E7800 Pure hypercholesterolemia, unspecified: Secondary | ICD-10-CM | POA: Diagnosis not present

## 2016-05-12 DIAGNOSIS — Z0000 Encounter for general adult medical examination without abnormal findings: Secondary | ICD-10-CM | POA: Diagnosis not present

## 2016-05-12 DIAGNOSIS — I825Z1 Chronic embolism and thrombosis of unspecified deep veins of right distal lower extremity: Secondary | ICD-10-CM | POA: Diagnosis not present

## 2016-05-12 DIAGNOSIS — K219 Gastro-esophageal reflux disease without esophagitis: Secondary | ICD-10-CM | POA: Diagnosis not present

## 2016-05-12 DIAGNOSIS — I10 Essential (primary) hypertension: Secondary | ICD-10-CM | POA: Diagnosis not present

## 2016-05-12 DIAGNOSIS — Z1389 Encounter for screening for other disorder: Secondary | ICD-10-CM | POA: Diagnosis not present

## 2016-05-12 DIAGNOSIS — K5901 Slow transit constipation: Secondary | ICD-10-CM | POA: Diagnosis not present

## 2016-05-12 DIAGNOSIS — J309 Allergic rhinitis, unspecified: Secondary | ICD-10-CM | POA: Diagnosis not present

## 2016-05-12 DIAGNOSIS — R7303 Prediabetes: Secondary | ICD-10-CM | POA: Diagnosis not present

## 2016-06-09 DIAGNOSIS — Z7901 Long term (current) use of anticoagulants: Secondary | ICD-10-CM | POA: Diagnosis not present

## 2016-06-11 DIAGNOSIS — Z7901 Long term (current) use of anticoagulants: Secondary | ICD-10-CM | POA: Diagnosis not present

## 2016-07-13 DIAGNOSIS — Z7901 Long term (current) use of anticoagulants: Secondary | ICD-10-CM | POA: Diagnosis not present

## 2016-08-02 ENCOUNTER — Ambulatory Visit (INDEPENDENT_AMBULATORY_CARE_PROVIDER_SITE_OTHER): Payer: Medicare Other

## 2016-08-02 ENCOUNTER — Encounter: Payer: Self-pay | Admitting: Podiatry

## 2016-08-02 ENCOUNTER — Ambulatory Visit (INDEPENDENT_AMBULATORY_CARE_PROVIDER_SITE_OTHER): Payer: Medicare Other | Admitting: Podiatry

## 2016-08-02 VITALS — BP 121/60 | HR 61 | Resp 16

## 2016-08-02 DIAGNOSIS — L84 Corns and callosities: Secondary | ICD-10-CM | POA: Diagnosis not present

## 2016-08-02 DIAGNOSIS — M2042 Other hammer toe(s) (acquired), left foot: Secondary | ICD-10-CM | POA: Diagnosis not present

## 2016-08-02 DIAGNOSIS — M2041 Other hammer toe(s) (acquired), right foot: Secondary | ICD-10-CM

## 2016-08-02 DIAGNOSIS — D169 Benign neoplasm of bone and articular cartilage, unspecified: Secondary | ICD-10-CM | POA: Diagnosis not present

## 2016-08-03 NOTE — Progress Notes (Signed)
Subjective:    Patient ID: Craig Freeman, male   DOB: 83 y.o.   MRN: [id number]   HPI patient presents with painful corn formation that he had had a number years ago stating it's hard to wear shoe gear comfortably    ROS      Objective:  Physical Exam  Cardiovascular: Intact distal pulses.   Pulmonary/Chest: Breath sounds normal.  Musculoskeletal: Normal range of motion.  Neurological: He is alert.  Skin: Skin is warm and dry.  Nursing note and vitals reviewed.  neurovascular status found to be intact with muscle strength adequate range of motion within normal limits. Patient's found to have keratotic lesions of the fifth digits bilateral fourth digit right that are painful when pressed and making shoe gear difficult and states that it's been going on for a fairly long period of time     Assessment:    Digital deformity with rotational component noted and keratotic-type lesion secondary to structural change     Plan:    H&P condition reviewed and debridement accomplished along with padding. Education rendered concerning hammertoe and consideration for arthroplasty long-term even though I like to avoid that due to advanced age

## 2016-08-10 DIAGNOSIS — Z7901 Long term (current) use of anticoagulants: Secondary | ICD-10-CM | POA: Diagnosis not present

## 2016-09-07 DIAGNOSIS — Z7901 Long term (current) use of anticoagulants: Secondary | ICD-10-CM | POA: Diagnosis not present

## 2016-10-05 DIAGNOSIS — Z7901 Long term (current) use of anticoagulants: Secondary | ICD-10-CM | POA: Diagnosis not present

## 2016-11-11 DIAGNOSIS — D696 Thrombocytopenia, unspecified: Secondary | ICD-10-CM | POA: Diagnosis not present

## 2016-11-11 DIAGNOSIS — R7303 Prediabetes: Secondary | ICD-10-CM | POA: Diagnosis not present

## 2016-11-11 DIAGNOSIS — Z23 Encounter for immunization: Secondary | ICD-10-CM | POA: Diagnosis not present

## 2016-11-11 DIAGNOSIS — I825Z1 Chronic embolism and thrombosis of unspecified deep veins of right distal lower extremity: Secondary | ICD-10-CM | POA: Diagnosis not present

## 2016-11-11 DIAGNOSIS — I10 Essential (primary) hypertension: Secondary | ICD-10-CM | POA: Diagnosis not present

## 2016-11-11 DIAGNOSIS — Z7901 Long term (current) use of anticoagulants: Secondary | ICD-10-CM | POA: Diagnosis not present

## 2016-11-11 DIAGNOSIS — N182 Chronic kidney disease, stage 2 (mild): Secondary | ICD-10-CM | POA: Diagnosis not present

## 2016-11-11 DIAGNOSIS — E7800 Pure hypercholesterolemia, unspecified: Secondary | ICD-10-CM | POA: Diagnosis not present

## 2016-11-11 DIAGNOSIS — J309 Allergic rhinitis, unspecified: Secondary | ICD-10-CM | POA: Diagnosis not present

## 2016-11-11 DIAGNOSIS — K5901 Slow transit constipation: Secondary | ICD-10-CM | POA: Diagnosis not present

## 2016-12-07 DIAGNOSIS — Z7901 Long term (current) use of anticoagulants: Secondary | ICD-10-CM | POA: Diagnosis not present

## 2016-12-09 DIAGNOSIS — N401 Enlarged prostate with lower urinary tract symptoms: Secondary | ICD-10-CM | POA: Diagnosis not present

## 2016-12-09 DIAGNOSIS — R351 Nocturia: Secondary | ICD-10-CM | POA: Diagnosis not present

## 2017-01-04 DIAGNOSIS — Z7901 Long term (current) use of anticoagulants: Secondary | ICD-10-CM | POA: Diagnosis not present

## 2017-01-14 DIAGNOSIS — H903 Sensorineural hearing loss, bilateral: Secondary | ICD-10-CM | POA: Diagnosis not present

## 2017-02-01 DIAGNOSIS — Z7901 Long term (current) use of anticoagulants: Secondary | ICD-10-CM | POA: Diagnosis not present

## 2017-02-09 DIAGNOSIS — J309 Allergic rhinitis, unspecified: Secondary | ICD-10-CM | POA: Diagnosis not present

## 2017-02-09 DIAGNOSIS — R51 Headache: Secondary | ICD-10-CM | POA: Diagnosis not present

## 2017-02-09 DIAGNOSIS — M542 Cervicalgia: Secondary | ICD-10-CM | POA: Diagnosis not present

## 2017-03-01 DIAGNOSIS — Z7901 Long term (current) use of anticoagulants: Secondary | ICD-10-CM | POA: Diagnosis not present

## 2017-03-29 DIAGNOSIS — Z7901 Long term (current) use of anticoagulants: Secondary | ICD-10-CM | POA: Diagnosis not present

## 2017-04-26 DIAGNOSIS — Z7901 Long term (current) use of anticoagulants: Secondary | ICD-10-CM | POA: Diagnosis not present

## 2017-05-23 DIAGNOSIS — N4889 Other specified disorders of penis: Secondary | ICD-10-CM | POA: Diagnosis not present

## 2017-05-24 DIAGNOSIS — Z7901 Long term (current) use of anticoagulants: Secondary | ICD-10-CM | POA: Diagnosis not present

## 2017-05-31 DIAGNOSIS — D696 Thrombocytopenia, unspecified: Secondary | ICD-10-CM | POA: Diagnosis not present

## 2017-05-31 DIAGNOSIS — I10 Essential (primary) hypertension: Secondary | ICD-10-CM | POA: Diagnosis not present

## 2017-05-31 DIAGNOSIS — Z1211 Encounter for screening for malignant neoplasm of colon: Secondary | ICD-10-CM | POA: Diagnosis not present

## 2017-05-31 DIAGNOSIS — Z7901 Long term (current) use of anticoagulants: Secondary | ICD-10-CM | POA: Diagnosis not present

## 2017-05-31 DIAGNOSIS — Z0000 Encounter for general adult medical examination without abnormal findings: Secondary | ICD-10-CM | POA: Diagnosis not present

## 2017-05-31 DIAGNOSIS — M1990 Unspecified osteoarthritis, unspecified site: Secondary | ICD-10-CM | POA: Diagnosis not present

## 2017-05-31 DIAGNOSIS — K5900 Constipation, unspecified: Secondary | ICD-10-CM | POA: Diagnosis not present

## 2017-05-31 DIAGNOSIS — J309 Allergic rhinitis, unspecified: Secondary | ICD-10-CM | POA: Diagnosis not present

## 2017-05-31 DIAGNOSIS — I825Z1 Chronic embolism and thrombosis of unspecified deep veins of right distal lower extremity: Secondary | ICD-10-CM | POA: Diagnosis not present

## 2017-05-31 DIAGNOSIS — K219 Gastro-esophageal reflux disease without esophagitis: Secondary | ICD-10-CM | POA: Diagnosis not present

## 2017-05-31 DIAGNOSIS — Z1389 Encounter for screening for other disorder: Secondary | ICD-10-CM | POA: Diagnosis not present

## 2017-05-31 DIAGNOSIS — R7309 Other abnormal glucose: Secondary | ICD-10-CM | POA: Diagnosis not present

## 2017-05-31 DIAGNOSIS — N400 Enlarged prostate without lower urinary tract symptoms: Secondary | ICD-10-CM | POA: Diagnosis not present

## 2017-05-31 DIAGNOSIS — E7800 Pure hypercholesterolemia, unspecified: Secondary | ICD-10-CM | POA: Diagnosis not present

## 2017-06-02 DIAGNOSIS — Z1211 Encounter for screening for malignant neoplasm of colon: Secondary | ICD-10-CM | POA: Diagnosis not present

## 2017-06-21 DIAGNOSIS — Z7901 Long term (current) use of anticoagulants: Secondary | ICD-10-CM | POA: Diagnosis not present

## 2017-07-19 DIAGNOSIS — Z7901 Long term (current) use of anticoagulants: Secondary | ICD-10-CM | POA: Diagnosis not present

## 2017-07-25 DIAGNOSIS — Z6825 Body mass index (BMI) 25.0-25.9, adult: Secondary | ICD-10-CM | POA: Diagnosis not present

## 2017-07-25 DIAGNOSIS — M722 Plantar fascial fibromatosis: Secondary | ICD-10-CM | POA: Diagnosis not present

## 2017-08-16 DIAGNOSIS — Z7901 Long term (current) use of anticoagulants: Secondary | ICD-10-CM | POA: Diagnosis not present

## 2017-09-13 DIAGNOSIS — Z7901 Long term (current) use of anticoagulants: Secondary | ICD-10-CM | POA: Diagnosis not present

## 2017-10-05 DIAGNOSIS — J069 Acute upper respiratory infection, unspecified: Secondary | ICD-10-CM | POA: Diagnosis not present

## 2017-10-05 DIAGNOSIS — J309 Allergic rhinitis, unspecified: Secondary | ICD-10-CM | POA: Diagnosis not present

## 2017-10-11 DIAGNOSIS — Z7901 Long term (current) use of anticoagulants: Secondary | ICD-10-CM | POA: Diagnosis not present

## 2017-11-08 DIAGNOSIS — Z23 Encounter for immunization: Secondary | ICD-10-CM | POA: Diagnosis not present

## 2017-11-08 DIAGNOSIS — Z7901 Long term (current) use of anticoagulants: Secondary | ICD-10-CM | POA: Diagnosis not present

## 2017-12-02 DIAGNOSIS — E7800 Pure hypercholesterolemia, unspecified: Secondary | ICD-10-CM | POA: Diagnosis not present

## 2017-12-02 DIAGNOSIS — R7303 Prediabetes: Secondary | ICD-10-CM | POA: Diagnosis not present

## 2017-12-02 DIAGNOSIS — J309 Allergic rhinitis, unspecified: Secondary | ICD-10-CM | POA: Diagnosis not present

## 2017-12-02 DIAGNOSIS — I10 Essential (primary) hypertension: Secondary | ICD-10-CM | POA: Diagnosis not present

## 2017-12-02 DIAGNOSIS — D696 Thrombocytopenia, unspecified: Secondary | ICD-10-CM | POA: Diagnosis not present

## 2017-12-02 DIAGNOSIS — G4700 Insomnia, unspecified: Secondary | ICD-10-CM | POA: Diagnosis not present

## 2017-12-02 DIAGNOSIS — N400 Enlarged prostate without lower urinary tract symptoms: Secondary | ICD-10-CM | POA: Diagnosis not present

## 2017-12-02 DIAGNOSIS — N182 Chronic kidney disease, stage 2 (mild): Secondary | ICD-10-CM | POA: Diagnosis not present

## 2017-12-02 DIAGNOSIS — I825Z1 Chronic embolism and thrombosis of unspecified deep veins of right distal lower extremity: Secondary | ICD-10-CM | POA: Diagnosis not present

## 2017-12-06 DIAGNOSIS — Z7901 Long term (current) use of anticoagulants: Secondary | ICD-10-CM | POA: Diagnosis not present

## 2017-12-09 DIAGNOSIS — N401 Enlarged prostate with lower urinary tract symptoms: Secondary | ICD-10-CM | POA: Diagnosis not present

## 2017-12-09 DIAGNOSIS — R310 Gross hematuria: Secondary | ICD-10-CM | POA: Diagnosis not present

## 2017-12-09 DIAGNOSIS — R351 Nocturia: Secondary | ICD-10-CM | POA: Diagnosis not present

## 2018-01-03 DIAGNOSIS — Z7901 Long term (current) use of anticoagulants: Secondary | ICD-10-CM | POA: Diagnosis not present

## 2018-01-31 DIAGNOSIS — Z7901 Long term (current) use of anticoagulants: Secondary | ICD-10-CM | POA: Diagnosis not present

## 2018-02-28 DIAGNOSIS — M79674 Pain in right toe(s): Secondary | ICD-10-CM | POA: Diagnosis not present

## 2018-02-28 DIAGNOSIS — Z7901 Long term (current) use of anticoagulants: Secondary | ICD-10-CM | POA: Diagnosis not present

## 2018-02-28 DIAGNOSIS — M2041 Other hammer toe(s) (acquired), right foot: Secondary | ICD-10-CM | POA: Diagnosis not present

## 2018-03-08 ENCOUNTER — Other Ambulatory Visit: Payer: Self-pay | Admitting: Podiatry

## 2018-03-08 ENCOUNTER — Ambulatory Visit (INDEPENDENT_AMBULATORY_CARE_PROVIDER_SITE_OTHER): Payer: Medicare Other | Admitting: Podiatry

## 2018-03-08 ENCOUNTER — Encounter: Payer: Self-pay | Admitting: Podiatry

## 2018-03-08 ENCOUNTER — Ambulatory Visit (INDEPENDENT_AMBULATORY_CARE_PROVIDER_SITE_OTHER): Payer: Medicare Other

## 2018-03-08 DIAGNOSIS — M779 Enthesopathy, unspecified: Secondary | ICD-10-CM

## 2018-03-08 DIAGNOSIS — M79671 Pain in right foot: Secondary | ICD-10-CM

## 2018-03-08 DIAGNOSIS — M7751 Other enthesopathy of right foot: Secondary | ICD-10-CM | POA: Diagnosis not present

## 2018-03-08 MED ORDER — TRIAMCINOLONE ACETONIDE 10 MG/ML IJ SUSP
10.0000 mg | Freq: Once | INTRAMUSCULAR | Status: AC
  Administered 2018-03-08: 10 mg

## 2018-03-08 NOTE — Progress Notes (Signed)
Subjective:   Patient ID: Craig Freeman, male   DOB: 85 y.o.   MRN: [id number]   HPI Patient states that developed some burning pain in the front part of my right foot for the last couple months and I do not remember specific injury   ROS      Objective:  Physical Exam  Neurovascular status intact with patient found to have inflammation of the fourth MPJ right that is painful when pressed and makes walking in shoe gear difficult     Assessment:  Probability for acute inflammatory capsulitis right     Plan:  H&P x-ray reviewed and today I did a sterile prep and injected the fourth MPJ 3 mg Kenalog 5 mg Xylocaine and applied sterile dressing.  Gave instructions for supportive shoes and reappoint for [place] to recheck again as needed  X-rays indicate no signs of stress fracture or acute arthritic process

## 2018-03-28 DIAGNOSIS — Z7901 Long term (current) use of anticoagulants: Secondary | ICD-10-CM | POA: Diagnosis not present

## 2018-04-25 DIAGNOSIS — Z7901 Long term (current) use of anticoagulants: Secondary | ICD-10-CM | POA: Diagnosis not present

## 2018-05-08 DIAGNOSIS — L03011 Cellulitis of right finger: Secondary | ICD-10-CM | POA: Diagnosis not present

## 2018-05-23 DIAGNOSIS — Z7901 Long term (current) use of anticoagulants: Secondary | ICD-10-CM | POA: Diagnosis not present

## 2018-06-17 DIAGNOSIS — H53121 Transient visual loss, right eye: Secondary | ICD-10-CM | POA: Diagnosis not present

## 2018-06-17 DIAGNOSIS — H35033 Hypertensive retinopathy, bilateral: Secondary | ICD-10-CM | POA: Diagnosis not present

## 2018-06-17 DIAGNOSIS — H3589 Other specified retinal disorders: Secondary | ICD-10-CM | POA: Diagnosis not present

## 2018-06-17 DIAGNOSIS — H3122 Choroidal dystrophy (central areolar) (generalized) (peripapillary): Secondary | ICD-10-CM | POA: Diagnosis not present

## 2018-06-17 DIAGNOSIS — Z961 Presence of intraocular lens: Secondary | ICD-10-CM | POA: Diagnosis not present

## 2018-06-20 DIAGNOSIS — Z7901 Long term (current) use of anticoagulants: Secondary | ICD-10-CM | POA: Diagnosis not present

## 2018-07-18 DIAGNOSIS — Z7901 Long term (current) use of anticoagulants: Secondary | ICD-10-CM | POA: Diagnosis not present

## 2018-08-01 DIAGNOSIS — J019 Acute sinusitis, unspecified: Secondary | ICD-10-CM | POA: Diagnosis not present

## 2018-08-01 DIAGNOSIS — J309 Allergic rhinitis, unspecified: Secondary | ICD-10-CM | POA: Diagnosis not present

## 2018-08-15 DIAGNOSIS — Z7901 Long term (current) use of anticoagulants: Secondary | ICD-10-CM | POA: Diagnosis not present

## 2018-09-12 DIAGNOSIS — Z7901 Long term (current) use of anticoagulants: Secondary | ICD-10-CM | POA: Diagnosis not present

## 2018-09-13 DIAGNOSIS — Z7901 Long term (current) use of anticoagulants: Secondary | ICD-10-CM | POA: Diagnosis not present

## 2018-09-13 DIAGNOSIS — N492 Inflammatory disorders of scrotum: Secondary | ICD-10-CM | POA: Diagnosis not present

## 2018-09-14 DIAGNOSIS — N492 Inflammatory disorders of scrotum: Secondary | ICD-10-CM | POA: Diagnosis not present

## 2018-09-18 DIAGNOSIS — N499 Inflammatory disorder of unspecified male genital organ: Secondary | ICD-10-CM | POA: Diagnosis not present

## 2018-09-18 DIAGNOSIS — Z7901 Long term (current) use of anticoagulants: Secondary | ICD-10-CM | POA: Diagnosis not present

## 2018-10-09 DIAGNOSIS — Z7901 Long term (current) use of anticoagulants: Secondary | ICD-10-CM | POA: Diagnosis not present

## 2018-10-09 DIAGNOSIS — N481 Balanitis: Secondary | ICD-10-CM | POA: Diagnosis not present

## 2018-11-07 DIAGNOSIS — Z7901 Long term (current) use of anticoagulants: Secondary | ICD-10-CM | POA: Diagnosis not present

## 2018-12-05 DIAGNOSIS — Z7901 Long term (current) use of anticoagulants: Secondary | ICD-10-CM | POA: Diagnosis not present

## 2018-12-06 DIAGNOSIS — H02831 Dermatochalasis of right upper eyelid: Secondary | ICD-10-CM | POA: Diagnosis not present

## 2018-12-06 DIAGNOSIS — H0102A Squamous blepharitis right eye, upper and lower eyelids: Secondary | ICD-10-CM | POA: Diagnosis not present

## 2018-12-06 DIAGNOSIS — H11823 Conjunctivochalasis, bilateral: Secondary | ICD-10-CM | POA: Diagnosis not present

## 2018-12-06 DIAGNOSIS — H18413 Arcus senilis, bilateral: Secondary | ICD-10-CM | POA: Diagnosis not present

## 2018-12-06 DIAGNOSIS — H0288A Meibomian gland dysfunction right eye, upper and lower eyelids: Secondary | ICD-10-CM | POA: Diagnosis not present

## 2018-12-06 DIAGNOSIS — H3589 Other specified retinal disorders: Secondary | ICD-10-CM | POA: Diagnosis not present

## 2018-12-06 DIAGNOSIS — H40013 Open angle with borderline findings, low risk, bilateral: Secondary | ICD-10-CM | POA: Diagnosis not present

## 2018-12-06 DIAGNOSIS — H02834 Dermatochalasis of left upper eyelid: Secondary | ICD-10-CM | POA: Diagnosis not present

## 2018-12-06 DIAGNOSIS — H0288B Meibomian gland dysfunction left eye, upper and lower eyelids: Secondary | ICD-10-CM | POA: Diagnosis not present

## 2018-12-06 DIAGNOSIS — Q141 Congenital malformation of retina: Secondary | ICD-10-CM | POA: Diagnosis not present

## 2018-12-06 DIAGNOSIS — H0102B Squamous blepharitis left eye, upper and lower eyelids: Secondary | ICD-10-CM | POA: Diagnosis not present

## 2018-12-11 DIAGNOSIS — M25511 Pain in right shoulder: Secondary | ICD-10-CM | POA: Diagnosis not present

## 2019-02-27 DIAGNOSIS — Z7901 Long term (current) use of anticoagulants: Secondary | ICD-10-CM | POA: Diagnosis not present

## 2019-03-27 DIAGNOSIS — Z7901 Long term (current) use of anticoagulants: Secondary | ICD-10-CM | POA: Diagnosis not present

## 2019-03-29 DIAGNOSIS — N401 Enlarged prostate with lower urinary tract symptoms: Secondary | ICD-10-CM | POA: Diagnosis not present

## 2019-03-29 DIAGNOSIS — R351 Nocturia: Secondary | ICD-10-CM | POA: Diagnosis not present

## 2019-05-01 DIAGNOSIS — Z7901 Long term (current) use of anticoagulants: Secondary | ICD-10-CM | POA: Diagnosis not present

## 2019-05-29 DIAGNOSIS — N400 Enlarged prostate without lower urinary tract symptoms: Secondary | ICD-10-CM | POA: Diagnosis not present

## 2019-05-29 DIAGNOSIS — E7800 Pure hypercholesterolemia, unspecified: Secondary | ICD-10-CM | POA: Diagnosis not present

## 2019-05-29 DIAGNOSIS — Z0000 Encounter for general adult medical examination without abnormal findings: Secondary | ICD-10-CM | POA: Diagnosis not present

## 2019-05-29 DIAGNOSIS — R7303 Prediabetes: Secondary | ICD-10-CM | POA: Diagnosis not present

## 2019-05-29 DIAGNOSIS — Z1389 Encounter for screening for other disorder: Secondary | ICD-10-CM | POA: Diagnosis not present

## 2019-05-29 DIAGNOSIS — I825Z1 Chronic embolism and thrombosis of unspecified deep veins of right distal lower extremity: Secondary | ICD-10-CM | POA: Diagnosis not present

## 2019-05-29 DIAGNOSIS — I10 Essential (primary) hypertension: Secondary | ICD-10-CM | POA: Diagnosis not present

## 2019-05-29 DIAGNOSIS — M1990 Unspecified osteoarthritis, unspecified site: Secondary | ICD-10-CM | POA: Diagnosis not present

## 2019-05-29 DIAGNOSIS — D696 Thrombocytopenia, unspecified: Secondary | ICD-10-CM | POA: Diagnosis not present

## 2019-05-29 DIAGNOSIS — J309 Allergic rhinitis, unspecified: Secondary | ICD-10-CM | POA: Diagnosis not present

## 2019-05-29 DIAGNOSIS — Z7901 Long term (current) use of anticoagulants: Secondary | ICD-10-CM | POA: Diagnosis not present

## 2019-05-29 DIAGNOSIS — N182 Chronic kidney disease, stage 2 (mild): Secondary | ICD-10-CM | POA: Diagnosis not present

## 2019-06-26 DIAGNOSIS — Z7901 Long term (current) use of anticoagulants: Secondary | ICD-10-CM | POA: Diagnosis not present

## 2019-07-24 DIAGNOSIS — M1711 Unilateral primary osteoarthritis, right knee: Secondary | ICD-10-CM | POA: Diagnosis not present

## 2019-08-21 DIAGNOSIS — Z7901 Long term (current) use of anticoagulants: Secondary | ICD-10-CM | POA: Diagnosis not present

## 2019-09-18 DIAGNOSIS — R351 Nocturia: Secondary | ICD-10-CM | POA: Diagnosis not present

## 2019-09-18 DIAGNOSIS — R310 Gross hematuria: Secondary | ICD-10-CM | POA: Diagnosis not present

## 2019-09-18 DIAGNOSIS — N401 Enlarged prostate with lower urinary tract symptoms: Secondary | ICD-10-CM | POA: Diagnosis not present

## 2019-09-21 DIAGNOSIS — Z7901 Long term (current) use of anticoagulants: Secondary | ICD-10-CM | POA: Diagnosis not present

## 2019-10-16 DIAGNOSIS — Z7901 Long term (current) use of anticoagulants: Secondary | ICD-10-CM | POA: Diagnosis not present

## 2019-11-05 DIAGNOSIS — H0102A Squamous blepharitis right eye, upper and lower eyelids: Secondary | ICD-10-CM | POA: Diagnosis not present

## 2019-11-05 DIAGNOSIS — Q141 Congenital malformation of retina: Secondary | ICD-10-CM | POA: Diagnosis not present

## 2019-11-05 DIAGNOSIS — H0102B Squamous blepharitis left eye, upper and lower eyelids: Secondary | ICD-10-CM | POA: Diagnosis not present

## 2019-11-05 DIAGNOSIS — H0288A Meibomian gland dysfunction right eye, upper and lower eyelids: Secondary | ICD-10-CM | POA: Diagnosis not present

## 2019-11-05 DIAGNOSIS — H40013 Open angle with borderline findings, low risk, bilateral: Secondary | ICD-10-CM | POA: Diagnosis not present

## 2019-11-05 DIAGNOSIS — H3589 Other specified retinal disorders: Secondary | ICD-10-CM | POA: Diagnosis not present

## 2019-11-05 DIAGNOSIS — H0288B Meibomian gland dysfunction left eye, upper and lower eyelids: Secondary | ICD-10-CM | POA: Diagnosis not present

## 2019-11-13 DIAGNOSIS — Z7901 Long term (current) use of anticoagulants: Secondary | ICD-10-CM | POA: Diagnosis not present

## 2019-11-27 DIAGNOSIS — E7800 Pure hypercholesterolemia, unspecified: Secondary | ICD-10-CM | POA: Diagnosis not present

## 2019-11-27 DIAGNOSIS — R7303 Prediabetes: Secondary | ICD-10-CM | POA: Diagnosis not present

## 2019-11-27 DIAGNOSIS — I825Z1 Chronic embolism and thrombosis of unspecified deep veins of right distal lower extremity: Secondary | ICD-10-CM | POA: Diagnosis not present

## 2019-11-27 DIAGNOSIS — M1990 Unspecified osteoarthritis, unspecified site: Secondary | ICD-10-CM | POA: Diagnosis not present

## 2019-11-27 DIAGNOSIS — D696 Thrombocytopenia, unspecified: Secondary | ICD-10-CM | POA: Diagnosis not present

## 2019-11-27 DIAGNOSIS — Z23 Encounter for immunization: Secondary | ICD-10-CM | POA: Diagnosis not present

## 2019-11-27 DIAGNOSIS — G4700 Insomnia, unspecified: Secondary | ICD-10-CM | POA: Diagnosis not present

## 2019-11-27 DIAGNOSIS — H5360 Unspecified night blindness: Secondary | ICD-10-CM | POA: Diagnosis not present

## 2019-11-27 DIAGNOSIS — J45909 Unspecified asthma, uncomplicated: Secondary | ICD-10-CM | POA: Diagnosis not present

## 2019-11-27 DIAGNOSIS — I10 Essential (primary) hypertension: Secondary | ICD-10-CM | POA: Diagnosis not present

## 2019-11-28 DIAGNOSIS — H5360 Unspecified night blindness: Secondary | ICD-10-CM | POA: Diagnosis not present

## 2019-11-28 DIAGNOSIS — I6523 Occlusion and stenosis of bilateral carotid arteries: Secondary | ICD-10-CM | POA: Diagnosis not present

## 2019-11-28 DIAGNOSIS — H539 Unspecified visual disturbance: Secondary | ICD-10-CM | POA: Diagnosis not present

## 2019-12-11 DIAGNOSIS — Z7901 Long term (current) use of anticoagulants: Secondary | ICD-10-CM | POA: Diagnosis not present

## 2019-12-13 DIAGNOSIS — Z23 Encounter for immunization: Secondary | ICD-10-CM | POA: Diagnosis not present

## 2019-12-26 DIAGNOSIS — L989 Disorder of the skin and subcutaneous tissue, unspecified: Secondary | ICD-10-CM | POA: Diagnosis not present

## 2020-01-01 ENCOUNTER — Other Ambulatory Visit: Payer: Self-pay | Admitting: Internal Medicine

## 2020-01-01 DIAGNOSIS — R220 Localized swelling, mass and lump, head: Secondary | ICD-10-CM

## 2020-01-01 DIAGNOSIS — R42 Dizziness and giddiness: Secondary | ICD-10-CM

## 2020-01-08 DIAGNOSIS — Z7901 Long term (current) use of anticoagulants: Secondary | ICD-10-CM | POA: Diagnosis not present

## 2020-01-10 ENCOUNTER — Other Ambulatory Visit: Payer: Self-pay

## 2020-01-10 ENCOUNTER — Ambulatory Visit
Admission: RE | Admit: 2020-01-10 | Discharge: 2020-01-10 | Disposition: A | Discharge status: Home / Self Care | Payer: Medicare Other | Source: Ambulatory Visit | Attending: Internal Medicine | Admitting: Internal Medicine

## 2020-01-10 DIAGNOSIS — R519 Headache, unspecified: Secondary | ICD-10-CM | POA: Diagnosis not present

## 2020-01-10 DIAGNOSIS — R42 Dizziness and giddiness: Secondary | ICD-10-CM

## 2020-01-10 DIAGNOSIS — R220 Localized swelling, mass and lump, head: Secondary | ICD-10-CM

## 2020-01-10 MED ORDER — IOPAMIDOL (ISOVUE-300) INJECTION 61%
75.0000 mL | Freq: Once | INTRAVENOUS | Status: AC | PRN
  Administered 2020-01-10: 75 mL via INTRAVENOUS

## 2020-01-21 DIAGNOSIS — I639 Cerebral infarction, unspecified: Secondary | ICD-10-CM | POA: Diagnosis not present

## 2020-01-22 ENCOUNTER — Other Ambulatory Visit: Payer: Medicare Other

## 2020-02-05 DIAGNOSIS — Z7901 Long term (current) use of anticoagulants: Secondary | ICD-10-CM | POA: Diagnosis not present

## 2020-02-27 DIAGNOSIS — E7800 Pure hypercholesterolemia, unspecified: Secondary | ICD-10-CM | POA: Diagnosis not present

## 2020-02-27 DIAGNOSIS — I10 Essential (primary) hypertension: Secondary | ICD-10-CM | POA: Diagnosis not present

## 2020-02-27 DIAGNOSIS — D696 Thrombocytopenia, unspecified: Secondary | ICD-10-CM | POA: Diagnosis not present

## 2020-02-27 DIAGNOSIS — R7303 Prediabetes: Secondary | ICD-10-CM | POA: Diagnosis not present

## 2020-02-27 DIAGNOSIS — I825Z1 Chronic embolism and thrombosis of unspecified deep veins of right distal lower extremity: Secondary | ICD-10-CM | POA: Diagnosis not present

## 2020-03-04 DIAGNOSIS — Z7901 Long term (current) use of anticoagulants: Secondary | ICD-10-CM | POA: Diagnosis not present

## 2020-04-01 DIAGNOSIS — Z7901 Long term (current) use of anticoagulants: Secondary | ICD-10-CM | POA: Diagnosis not present

## 2020-04-29 DIAGNOSIS — Z7901 Long term (current) use of anticoagulants: Secondary | ICD-10-CM | POA: Diagnosis not present

## 2020-04-30 DIAGNOSIS — Z23 Encounter for immunization: Secondary | ICD-10-CM | POA: Diagnosis not present

## 2020-05-06 DIAGNOSIS — H40013 Open angle with borderline findings, low risk, bilateral: Secondary | ICD-10-CM | POA: Diagnosis not present

## 2020-05-06 DIAGNOSIS — Q141 Congenital malformation of retina: Secondary | ICD-10-CM | POA: Diagnosis not present

## 2020-05-06 DIAGNOSIS — H04123 Dry eye syndrome of bilateral lacrimal glands: Secondary | ICD-10-CM | POA: Diagnosis not present

## 2020-05-06 DIAGNOSIS — H353131 Nonexudative age-related macular degeneration, bilateral, early dry stage: Secondary | ICD-10-CM | POA: Diagnosis not present

## 2020-05-06 DIAGNOSIS — H0102B Squamous blepharitis left eye, upper and lower eyelids: Secondary | ICD-10-CM | POA: Diagnosis not present

## 2020-05-06 DIAGNOSIS — H0102A Squamous blepharitis right eye, upper and lower eyelids: Secondary | ICD-10-CM | POA: Diagnosis not present

## 2020-05-06 DIAGNOSIS — Z961 Presence of intraocular lens: Secondary | ICD-10-CM | POA: Diagnosis not present

## 2020-05-29 DIAGNOSIS — Z5181 Encounter for therapeutic drug level monitoring: Secondary | ICD-10-CM | POA: Diagnosis not present

## 2020-05-29 DIAGNOSIS — Z7901 Long term (current) use of anticoagulants: Secondary | ICD-10-CM | POA: Diagnosis not present

## 2020-05-29 DIAGNOSIS — I10 Essential (primary) hypertension: Secondary | ICD-10-CM | POA: Diagnosis not present

## 2020-06-30 DIAGNOSIS — I639 Cerebral infarction, unspecified: Secondary | ICD-10-CM | POA: Diagnosis not present

## 2020-06-30 DIAGNOSIS — G4700 Insomnia, unspecified: Secondary | ICD-10-CM | POA: Diagnosis not present

## 2020-06-30 DIAGNOSIS — Z7901 Long term (current) use of anticoagulants: Secondary | ICD-10-CM | POA: Diagnosis not present

## 2020-06-30 DIAGNOSIS — R7303 Prediabetes: Secondary | ICD-10-CM | POA: Diagnosis not present

## 2020-06-30 DIAGNOSIS — D696 Thrombocytopenia, unspecified: Secondary | ICD-10-CM | POA: Diagnosis not present

## 2020-06-30 DIAGNOSIS — I10 Essential (primary) hypertension: Secondary | ICD-10-CM | POA: Diagnosis not present

## 2020-06-30 DIAGNOSIS — N182 Chronic kidney disease, stage 2 (mild): Secondary | ICD-10-CM | POA: Diagnosis not present

## 2020-06-30 DIAGNOSIS — J45909 Unspecified asthma, uncomplicated: Secondary | ICD-10-CM | POA: Diagnosis not present

## 2020-06-30 DIAGNOSIS — Z0000 Encounter for general adult medical examination without abnormal findings: Secondary | ICD-10-CM | POA: Diagnosis not present

## 2020-06-30 DIAGNOSIS — E7800 Pure hypercholesterolemia, unspecified: Secondary | ICD-10-CM | POA: Diagnosis not present

## 2020-06-30 DIAGNOSIS — I825Z1 Chronic embolism and thrombosis of unspecified deep veins of right distal lower extremity: Secondary | ICD-10-CM | POA: Diagnosis not present

## 2020-07-29 DIAGNOSIS — I10 Essential (primary) hypertension: Secondary | ICD-10-CM | POA: Diagnosis not present

## 2020-07-29 DIAGNOSIS — Z7901 Long term (current) use of anticoagulants: Secondary | ICD-10-CM | POA: Diagnosis not present

## 2020-08-14 DIAGNOSIS — J45909 Unspecified asthma, uncomplicated: Secondary | ICD-10-CM | POA: Diagnosis not present

## 2020-08-14 DIAGNOSIS — R0981 Nasal congestion: Secondary | ICD-10-CM | POA: Diagnosis not present

## 2020-08-27 DIAGNOSIS — R791 Abnormal coagulation profile: Secondary | ICD-10-CM | POA: Diagnosis not present

## 2020-09-22 DIAGNOSIS — R3912 Poor urinary stream: Secondary | ICD-10-CM | POA: Diagnosis not present

## 2020-09-22 DIAGNOSIS — N401 Enlarged prostate with lower urinary tract symptoms: Secondary | ICD-10-CM | POA: Diagnosis not present

## 2020-09-22 DIAGNOSIS — N403 Nodular prostate with lower urinary tract symptoms: Secondary | ICD-10-CM | POA: Diagnosis not present

## 2020-09-26 DIAGNOSIS — R791 Abnormal coagulation profile: Secondary | ICD-10-CM | POA: Diagnosis not present

## 2020-10-14 DIAGNOSIS — N402 Nodular prostate without lower urinary tract symptoms: Secondary | ICD-10-CM | POA: Diagnosis not present

## 2020-10-14 DIAGNOSIS — N403 Nodular prostate with lower urinary tract symptoms: Secondary | ICD-10-CM | POA: Diagnosis not present

## 2020-10-14 DIAGNOSIS — N3289 Other specified disorders of bladder: Secondary | ICD-10-CM | POA: Diagnosis not present

## 2020-10-14 DIAGNOSIS — C61 Malignant neoplasm of prostate: Secondary | ICD-10-CM | POA: Diagnosis not present

## 2020-10-14 DIAGNOSIS — J929 Pleural plaque without asbestos: Secondary | ICD-10-CM | POA: Diagnosis not present

## 2020-10-14 DIAGNOSIS — N289 Disorder of kidney and ureter, unspecified: Secondary | ICD-10-CM | POA: Diagnosis not present

## 2020-10-14 DIAGNOSIS — R918 Other nonspecific abnormal finding of lung field: Secondary | ICD-10-CM | POA: Diagnosis not present

## 2020-10-14 DIAGNOSIS — N400 Enlarged prostate without lower urinary tract symptoms: Secondary | ICD-10-CM | POA: Diagnosis not present

## 2020-10-28 DIAGNOSIS — Z5111 Encounter for antineoplastic chemotherapy: Secondary | ICD-10-CM | POA: Diagnosis not present

## 2020-10-28 DIAGNOSIS — C61 Malignant neoplasm of prostate: Secondary | ICD-10-CM | POA: Diagnosis not present

## 2020-11-05 DIAGNOSIS — H40013 Open angle with borderline findings, low risk, bilateral: Secondary | ICD-10-CM | POA: Diagnosis not present

## 2020-11-06 DIAGNOSIS — R3912 Poor urinary stream: Secondary | ICD-10-CM | POA: Diagnosis not present

## 2020-11-06 DIAGNOSIS — C61 Malignant neoplasm of prostate: Secondary | ICD-10-CM | POA: Diagnosis not present

## 2020-11-21 DIAGNOSIS — Z7901 Long term (current) use of anticoagulants: Secondary | ICD-10-CM | POA: Diagnosis not present

## 2020-11-26 ENCOUNTER — Ambulatory Visit (INDEPENDENT_AMBULATORY_CARE_PROVIDER_SITE_OTHER): Payer: Medicare Other | Admitting: Podiatry

## 2020-11-26 ENCOUNTER — Other Ambulatory Visit: Payer: Self-pay

## 2020-11-26 DIAGNOSIS — L84 Corns and callosities: Secondary | ICD-10-CM | POA: Diagnosis not present

## 2020-11-27 DIAGNOSIS — Z7901 Long term (current) use of anticoagulants: Secondary | ICD-10-CM | POA: Diagnosis not present

## 2020-11-27 NOTE — Progress Notes (Signed)
Subjective:  Patient ID: Craig Freeman, male    DOB: 02/18/1924,  MRN: [id number]  Chief Complaint  Patient presents with   Callouses    Right     87 y.o. male presents with the above complaint.  Patient presents with a new complaint of right third and fourth interdigital heloma molle.  Patient states is painful to touch painful to ambulate on.  He states that he is try to take care of himself and has not gotten better.  He does not wear any spacers.  He has not made any shoe gear modification.  He denies any other acute complaints.  Pain scale 7 out of 10 dull aching nature.   Review of Systems: Negative except as noted in the HPI. Denies N/V/F/Ch.  Past Medical History:  Diagnosis Date   Constipation    DVT (deep venous thrombosis), right    coumadin   Hyperlipidemia    Hypertension     Current Outpatient Medications:    albuterol (PROVENTIL HFA;VENTOLIN HFA) 108 (90 BASE) MCG/ACT inhaler, Inhale 2 puffs into the lungs every 6 (six) hours as needed. Shortness of breath, Disp: , Rfl:    atenolol (TENORMIN) 50 MG tablet, , Disp: , Rfl:    atenolol-chlorthalidone (TENORETIC) 50-25 MG per tablet, Take 1 tablet by mouth daily., Disp: 30 tablet, Rfl: 5   atorvastatin (LIPITOR) 40 MG tablet, Take 40 mg by mouth daily., Disp: , Rfl:    chlorthalidone (HYGROTON) 25 MG tablet, Take 25 mg by mouth daily., Disp: , Rfl:    Cholecalciferol (VITAMIN D PO), Take 1 tablet by mouth daily., Disp: , Rfl:    [employer]S MELATONIN 5 MG TABS, 1 TABLET AT BEDTIME AS NEEDED WITH FOOD ONCE A DAY AT BEDTIME ORALLY 30, Disp: , Rfl:    DENTA 5000 PLUS 1.1 % CREA dental cream, , Disp: , Rfl:    Famotidine (PEPCID PO), Take by mouth., Disp: , Rfl:    famotidine (PEPCID) 20 MG tablet, Take 20 mg by mouth 2 (two) times daily., Disp: , Rfl:    ferrous sulfate 325 (65 FE) MG tablet, Take 325 mg by mouth daily with breakfast., Disp: , Rfl:    finasteride (PROSCAR) 5 MG tablet, Take 5 mg by mouth daily., Disp: , Rfl:     fluticasone (FLONASE) 50 MCG/ACT nasal spray, INHALE 2 PUFFS EACH NOSTRIL EVERY DAY 30, Disp: , Rfl:    Fluticasone-Salmeterol (ADVAIR) 100-50 MCG/DOSE AEPB, Inhale 1 puff into the lungs every 12 (twelve) hours. Shortness of breath, Disp: , Rfl:    KLOR-CON M10 10 MEQ tablet, Take 20 mEq by mouth daily., Disp: , Rfl:    levocetirizine (XYZAL) 5 MG tablet, every evening., Disp: , Rfl:    potassium chloride (K-DUR) 10 MEQ tablet, Take 2 tablets (20 mEq total) by mouth 2 (two) times daily., Disp: 120 tablet, Rfl: 6   warfarin (COUMADIN) 5 MG tablet, Take 5 mg by mouth daily., Disp: , Rfl:   Social History   Tobacco Use  Smoking Status Never  Smokeless Tobacco Never    No Known Allergies Objective:  There were no vitals filed for this visit. There is no height or weight on file to calculate BMI. Constitutional Well developed. Well nourished.  Vascular Dorsalis pedis pulses palpable bilaterally. Posterior tibial pulses palpable bilaterally. Capillary refill normal to all digits.  No cyanosis or clubbing noted. Pedal hair growth normal.  Neurologic Normal speech. Oriented to person, place, and time. Epicritic sensation to light touch grossly present  bilaterally.  Dermatologic Heloma molle noted to the right interdigital space between the third and the fourth digit.  Pain on palpation to the:/Hyperkeratotic lesion.  No pinpoint bleeding noted upon debridement  Orthopedic: Normal joint ROM without pain or crepitus bilaterally. No visible deformities. No bony tenderness.   Radiographs: None Assessment:   1. Heloma molle    Plan:  Patient was evaluated and treated and all questions answered.  Right third and fourth digit heloma molle -I explained to the patient the etiology of heloma molle and various treatment options were discussed.  Ultimately I discussed with the patient that he is can make shoe gear modification with a wider toe box to take the pressure away from squeezing the  toes in between.  I also believe would benefit from a spacer.  Multiple spacers and toe covers were dispensed. -If there is no improvement we will discuss surgical med management at that time.  He states understanding  No follow-ups on file.

## 2020-12-01 ENCOUNTER — Ambulatory Visit: Payer: Medicare Other | Admitting: Podiatry

## 2020-12-23 DIAGNOSIS — C61 Malignant neoplasm of prostate: Secondary | ICD-10-CM | POA: Diagnosis not present

## 2020-12-25 DIAGNOSIS — Z7901 Long term (current) use of anticoagulants: Secondary | ICD-10-CM | POA: Diagnosis not present

## 2020-12-31 DIAGNOSIS — C7951 Secondary malignant neoplasm of bone: Secondary | ICD-10-CM | POA: Diagnosis not present

## 2020-12-31 DIAGNOSIS — C61 Malignant neoplasm of prostate: Secondary | ICD-10-CM | POA: Diagnosis not present

## 2021-01-22 DIAGNOSIS — Z7901 Long term (current) use of anticoagulants: Secondary | ICD-10-CM | POA: Diagnosis not present

## 2021-02-04 DIAGNOSIS — C61 Malignant neoplasm of prostate: Secondary | ICD-10-CM | POA: Diagnosis not present

## 2021-02-11 DIAGNOSIS — C61 Malignant neoplasm of prostate: Secondary | ICD-10-CM | POA: Diagnosis not present

## 2021-02-11 DIAGNOSIS — C7951 Secondary malignant neoplasm of bone: Secondary | ICD-10-CM | POA: Diagnosis not present

## 2021-02-18 DIAGNOSIS — C7951 Secondary malignant neoplasm of bone: Secondary | ICD-10-CM | POA: Diagnosis not present

## 2021-02-18 DIAGNOSIS — C61 Malignant neoplasm of prostate: Secondary | ICD-10-CM | POA: Diagnosis not present

## 2021-02-23 DIAGNOSIS — Z7901 Long term (current) use of anticoagulants: Secondary | ICD-10-CM | POA: Diagnosis not present

## 2021-02-26 DIAGNOSIS — I825Z1 Chronic embolism and thrombosis of unspecified deep veins of right distal lower extremity: Secondary | ICD-10-CM | POA: Diagnosis not present

## 2021-02-26 DIAGNOSIS — I639 Cerebral infarction, unspecified: Secondary | ICD-10-CM | POA: Diagnosis not present

## 2021-02-26 DIAGNOSIS — G4700 Insomnia, unspecified: Secondary | ICD-10-CM | POA: Diagnosis not present

## 2021-02-26 DIAGNOSIS — Z7901 Long term (current) use of anticoagulants: Secondary | ICD-10-CM | POA: Diagnosis not present

## 2021-02-26 DIAGNOSIS — R7303 Prediabetes: Secondary | ICD-10-CM | POA: Diagnosis not present

## 2021-02-26 DIAGNOSIS — C61 Malignant neoplasm of prostate: Secondary | ICD-10-CM | POA: Diagnosis not present

## 2021-02-26 DIAGNOSIS — E7800 Pure hypercholesterolemia, unspecified: Secondary | ICD-10-CM | POA: Diagnosis not present

## 2021-02-26 DIAGNOSIS — N182 Chronic kidney disease, stage 2 (mild): Secondary | ICD-10-CM | POA: Diagnosis not present

## 2021-02-26 DIAGNOSIS — D696 Thrombocytopenia, unspecified: Secondary | ICD-10-CM | POA: Diagnosis not present

## 2021-02-26 DIAGNOSIS — I10 Essential (primary) hypertension: Secondary | ICD-10-CM | POA: Diagnosis not present

## 2021-03-07 DIAGNOSIS — Z23 Encounter for immunization: Secondary | ICD-10-CM | POA: Diagnosis not present

## 2021-03-18 DIAGNOSIS — C7951 Secondary malignant neoplasm of bone: Secondary | ICD-10-CM | POA: Diagnosis not present

## 2021-03-18 DIAGNOSIS — C61 Malignant neoplasm of prostate: Secondary | ICD-10-CM | POA: Diagnosis not present

## 2021-03-25 DIAGNOSIS — N182 Chronic kidney disease, stage 2 (mild): Secondary | ICD-10-CM | POA: Diagnosis not present

## 2021-03-25 DIAGNOSIS — Z7901 Long term (current) use of anticoagulants: Secondary | ICD-10-CM | POA: Diagnosis not present

## 2021-03-25 DIAGNOSIS — I10 Essential (primary) hypertension: Secondary | ICD-10-CM | POA: Diagnosis not present

## 2021-03-25 DIAGNOSIS — C61 Malignant neoplasm of prostate: Secondary | ICD-10-CM | POA: Diagnosis not present

## 2021-04-08 DIAGNOSIS — Z7901 Long term (current) use of anticoagulants: Secondary | ICD-10-CM | POA: Diagnosis not present

## 2021-05-06 DIAGNOSIS — Z7901 Long term (current) use of anticoagulants: Secondary | ICD-10-CM | POA: Diagnosis not present

## 2021-05-13 DIAGNOSIS — C61 Malignant neoplasm of prostate: Secondary | ICD-10-CM | POA: Diagnosis not present

## 2021-05-20 DIAGNOSIS — R3915 Urgency of urination: Secondary | ICD-10-CM | POA: Diagnosis not present

## 2021-05-20 DIAGNOSIS — C61 Malignant neoplasm of prostate: Secondary | ICD-10-CM | POA: Diagnosis not present

## 2021-05-20 DIAGNOSIS — Z7901 Long term (current) use of anticoagulants: Secondary | ICD-10-CM | POA: Diagnosis not present

## 2021-05-20 DIAGNOSIS — C7951 Secondary malignant neoplasm of bone: Secondary | ICD-10-CM | POA: Diagnosis not present

## 2021-05-20 DIAGNOSIS — N401 Enlarged prostate with lower urinary tract symptoms: Secondary | ICD-10-CM | POA: Diagnosis not present

## 2021-06-17 DIAGNOSIS — Z7901 Long term (current) use of anticoagulants: Secondary | ICD-10-CM | POA: Diagnosis not present

## 2021-06-18 DIAGNOSIS — C61 Malignant neoplasm of prostate: Secondary | ICD-10-CM | POA: Diagnosis not present

## 2021-06-18 DIAGNOSIS — C7951 Secondary malignant neoplasm of bone: Secondary | ICD-10-CM | POA: Diagnosis not present

## 2021-07-03 DIAGNOSIS — Z7901 Long term (current) use of anticoagulants: Secondary | ICD-10-CM | POA: Diagnosis not present

## 2021-07-08 DIAGNOSIS — I10 Essential (primary) hypertension: Secondary | ICD-10-CM | POA: Diagnosis not present

## 2021-07-08 DIAGNOSIS — D696 Thrombocytopenia, unspecified: Secondary | ICD-10-CM | POA: Diagnosis not present

## 2021-07-08 DIAGNOSIS — Z7901 Long term (current) use of anticoagulants: Secondary | ICD-10-CM | POA: Diagnosis not present

## 2021-07-08 DIAGNOSIS — I825Z1 Chronic embolism and thrombosis of unspecified deep veins of right distal lower extremity: Secondary | ICD-10-CM | POA: Diagnosis not present

## 2021-07-08 DIAGNOSIS — C61 Malignant neoplasm of prostate: Secondary | ICD-10-CM | POA: Diagnosis not present

## 2021-07-08 DIAGNOSIS — J45909 Unspecified asthma, uncomplicated: Secondary | ICD-10-CM | POA: Diagnosis not present

## 2021-07-08 DIAGNOSIS — G4700 Insomnia, unspecified: Secondary | ICD-10-CM | POA: Diagnosis not present

## 2021-07-08 DIAGNOSIS — Z0000 Encounter for general adult medical examination without abnormal findings: Secondary | ICD-10-CM | POA: Diagnosis not present

## 2021-07-08 DIAGNOSIS — N182 Chronic kidney disease, stage 2 (mild): Secondary | ICD-10-CM | POA: Diagnosis not present

## 2021-07-08 DIAGNOSIS — I639 Cerebral infarction, unspecified: Secondary | ICD-10-CM | POA: Diagnosis not present

## 2021-07-08 DIAGNOSIS — R7303 Prediabetes: Secondary | ICD-10-CM | POA: Diagnosis not present

## 2021-07-08 DIAGNOSIS — Z1331 Encounter for screening for depression: Secondary | ICD-10-CM | POA: Diagnosis not present

## 2021-07-08 DIAGNOSIS — E7800 Pure hypercholesterolemia, unspecified: Secondary | ICD-10-CM | POA: Diagnosis not present

## 2021-07-08 DIAGNOSIS — N400 Enlarged prostate without lower urinary tract symptoms: Secondary | ICD-10-CM | POA: Diagnosis not present

## 2021-07-20 DIAGNOSIS — Z7901 Long term (current) use of anticoagulants: Secondary | ICD-10-CM | POA: Diagnosis not present

## 2021-07-22 DIAGNOSIS — C7951 Secondary malignant neoplasm of bone: Secondary | ICD-10-CM | POA: Diagnosis not present

## 2021-07-22 DIAGNOSIS — C61 Malignant neoplasm of prostate: Secondary | ICD-10-CM | POA: Diagnosis not present

## 2021-08-03 DIAGNOSIS — Z7901 Long term (current) use of anticoagulants: Secondary | ICD-10-CM | POA: Diagnosis not present

## 2021-08-18 DIAGNOSIS — Z7901 Long term (current) use of anticoagulants: Secondary | ICD-10-CM | POA: Diagnosis not present

## 2021-08-19 DIAGNOSIS — C61 Malignant neoplasm of prostate: Secondary | ICD-10-CM | POA: Diagnosis not present

## 2021-08-26 DIAGNOSIS — C7951 Secondary malignant neoplasm of bone: Secondary | ICD-10-CM | POA: Diagnosis not present

## 2021-08-26 DIAGNOSIS — C61 Malignant neoplasm of prostate: Secondary | ICD-10-CM | POA: Diagnosis not present

## 2021-09-09 ENCOUNTER — Ambulatory Visit
Admission: EM | Admit: 2021-09-09 | Discharge: 2021-09-09 | Disposition: A | Discharge status: Home / Self Care | Payer: Medicare Other | Attending: Internal Medicine | Admitting: Internal Medicine

## 2021-09-09 DIAGNOSIS — T63441A Toxic effect of venom of bees, accidental (unintentional), initial encounter: Secondary | ICD-10-CM

## 2021-09-09 DIAGNOSIS — R21 Rash and other nonspecific skin eruption: Secondary | ICD-10-CM

## 2021-09-09 HISTORY — DX: Pure hypercholesterolemia, unspecified: E78.00

## 2021-09-09 MED ORDER — TRIAMCINOLONE ACETONIDE 0.1 % EX CREA: 1.0000 | TOPICAL_CREAM | Freq: Two times a day (BID) | CUTANEOUS | 0 refills | Status: DC

## 2021-09-09 NOTE — ED Triage Notes (Signed)
Pt report Bee sting on the left hand and feel tight Left had appeared to be swollen Denied pain   Onset : Yesterday late afternoon

## 2021-09-09 NOTE — ED Provider Notes (Signed)
[facility]    CSN: [id number] Arrival date & time: 09/09/21  1659      History   Chief Complaint Chief Complaint  Patient presents with   Insect Bite    HPI Craig Freeman is a 88 y.o. male.   Patient presents with bee sting to dorsal surface of left hand that happened yesterday afternoon while patient was working in his yard.  He denies chest pain, feelings of throat closing, shortness of breath.  Patient has not taken any medications to help alleviate symptoms.  Reports itchiness and mild swelling located to the dorsal surface of left hand.     Past Medical History:  Diagnosis Date   Constipation    DVT (deep venous thrombosis), right    coumadin   High cholesterol    Hyperlipidemia    Hypertension     Patient Active Problem List   Diagnosis Date Noted   Abscess 06/14/2011   Fever 06/14/2011   Hypertension 06/14/2011   Hypokalemia 06/14/2011   Insomnia 06/14/2011   DEEP VENOUS THROMBOPHLEBITIS, HX OF 07/08/2009    Past Surgical History:  Procedure Laterality Date   JOINT REPLACEMENT         Home Medications    Prior to Admission medications   Medication Sig Start Date End Date Taking? Authorizing Provider  triamcinolone cream (KENALOG) 0.1 % Apply 1 Application topically 2 (two) times daily. 09/09/21  Yes , Hildred Alamin E, FNP  albuterol (PROVENTIL HFA;VENTOLIN HFA) 108 (90 BASE) MCG/ACT inhaler Inhale 2 puffs into the lungs every 6 (six) hours as needed. Shortness of breath    [provider]  atenolol (TENORMIN) 50 MG tablet  02/17/18   [provider]  atenolol-chlorthalidone (TENORETIC) 50-25 MG per tablet Take 1 tablet by mouth daily. 06/16/11   Wenda Low, MD  atorvastatin (LIPITOR) 40 MG tablet Take 40 mg by mouth daily.    [provider]  chlorthalidone (HYGROTON) 25 MG tablet Take 25 mg by mouth daily.    [provider]  Cholecalciferol (VITAMIN D PO) Take 1 tablet by mouth daily.     [provider]  CVS MELATONIN 5 MG TABS 1 TABLET AT BEDTIME AS NEEDED WITH FOOD ONCE A DAY AT BEDTIME ORALLY 30 09/15/17   [provider]  DENTA 5000 PLUS 1.1 % CREA dental cream  02/20/18   [provider]  Famotidine (PEPCID PO) Take by mouth.    [provider]  famotidine (PEPCID) 20 MG tablet Take 20 mg by mouth 2 (two) times daily. 12/20/17   [provider]  ferrous sulfate 325 (65 FE) MG tablet Take 325 mg by mouth daily with breakfast.    [provider]  finasteride (PROSCAR) 5 MG tablet Take 5 mg by mouth daily.    [provider]  fluticasone (FLONASE) 50 MCG/ACT nasal spray INHALE 2 PUFFS EACH NOSTRIL EVERY DAY 30 12/20/17   [provider]  Fluticasone-Salmeterol (ADVAIR) 100-50 MCG/DOSE AEPB Inhale 1 puff into the lungs every 12 (twelve) hours. Shortness of breath    [provider]  KLOR-CON M10 10 MEQ tablet Take 20 mEq by mouth daily. 02/27/18   [provider]  levocetirizine (XYZAL) 5 MG tablet every evening. 10/05/17   [provider]  potassium chloride (K-DUR) 10 MEQ tablet Take 2 tablets (20 mEq total) by mouth 2 (two) times daily. 06/16/11   Wenda Low, MD  warfarin (COUMADIN) 5 MG tablet Take 5 mg by mouth daily.  [provider]    Family History History reviewed. No pertinent family history.  Social History Social History   Tobacco Use   Smoking status: Never   Smokeless tobacco: Never  Substance Use Topics   Alcohol use: No   Drug use: No     Allergies   Patient has no known allergies.   Review of Systems Review of Systems Per HPI  Physical Exam Triage Vital Signs ED Triage Vitals  Enc Vitals Group     BP 09/09/21 1753 (!) 155/80     Pulse Rate 09/09/21 1753 (!) 50     Resp 09/09/21 1753 18     Temp 09/09/21 1753 98.1 F (36.7 C)     Temp Source 09/09/21 1753 Oral     SpO2 09/09/21 1753 95 %     Weight 09/09/21 1806 142 lb (64.4  kg)     Height 09/09/21 1806 '5\' 4"'$  (1.626 m)     Head Circumference --      Peak Flow --      Pain Score 09/09/21 1800 0     Pain Loc --      Pain Edu? --      Excl. in [place]? --    No data found.  Updated Vital Signs BP (!) 155/80 (BP Location: Right Arm)   Pulse (!) 57   Temp 98.1 F (36.7 C) (Oral)   Resp 18   Ht '5\' 4"'$  (1.626 m)   Wt 142 lb (64.4 kg)   SpO2 95%   BMI 24.37 kg/m   Visual Acuity Right Eye Distance:   Left Eye Distance:   Bilateral Distance:    Right Eye Near:   Left Eye Near:    Bilateral Near:     Physical Exam Constitutional:      General: He is not in acute distress.    Appearance: Normal appearance. He is not toxic-appearing or diaphoretic.  HENT:     Head: Normocephalic and atraumatic.     Mouth/Throat:     Pharynx: No pharyngeal swelling.     Comments: Tongue normal Eyes:     Extraocular Movements: Extraocular movements intact.     Conjunctiva/sclera: Conjunctivae normal.  Cardiovascular:     Rate and Rhythm: Regular rhythm. Bradycardia present.  Pulmonary:     Effort: Pulmonary effort is normal. No respiratory distress.     Breath sounds: Normal breath sounds. No stridor. No wheezing, rhonchi or rales.  Skin:    Comments: Mild erythema and mild swelling located throughout dorsal surface of left hand.  No purulent drainage or lesions noted.  Patient has full range of motion of hand.  Grip strength 5/5.  Neurovascular intact.  Neurological:     General: No focal deficit present.     Mental Status: He is alert and oriented to person, place, and time. Mental status is at baseline.  Psychiatric:        Mood and Affect: Mood normal.        Behavior: Behavior normal.        Thought Content: Thought content normal.        Judgment: Judgment normal.      UC Treatments / Results  Labs (all labs ordered are listed, but only abnormal results are displayed) Labs Reviewed - No data to display  EKG   Radiology No results  found.  Procedures Procedures (including critical care time)  Medications Ordered in UC Medications - No data to display  Initial Impression / Assessment  and Plan / UC Course  I have reviewed the triage vital signs and the nursing notes.  Pertinent labs & imaging results that were available during my care of the patient were reviewed by me and considered in my medical decision making (see chart for details).     Mild local reaction to bee sting.  Will treat with triamcinolone topically and patient advised of cool compresses.  Patient's chart states that he already takes Pepcid and antihistamine so the patient was encouraged to continue these.  Patient to follow-up if symptoms persist or worsen.  No signs of anaphylaxis on exam.  Patient verbalized understanding and was agreeable with plan. Final Clinical Impressions(s) / UC Diagnoses   Final diagnoses:  Rash and nonspecific skin eruption  Bee sting, accidental or unintentional, initial encounter     Discharge Instructions      You have been prescribed a cream to apply directly to hand.  It appears that you already take Xyzal and Pepcid so recommend continuing these as well as they will be helpful.  Recommend ice application to affected area of pain.    ED Prescriptions     Medication Sig Dispense Auth. Provider   triamcinolone cream (KENALOG) 0.1 % Apply 1 Application topically 2 (two) times daily. 30 g Teodora Medici, [place]      PDMP not reviewed this encounter.   Teodora Medici, [place] 09/09/21 [phone]

## 2021-09-09 NOTE — Discharge Instructions (Signed)
You have been prescribed a cream to apply directly to hand.  It appears that you already take Xyzal and Pepcid so recommend continuing these as well as they will be helpful.  Recommend ice application to affected area of pain.

## 2021-09-17 DIAGNOSIS — Z7901 Long term (current) use of anticoagulants: Secondary | ICD-10-CM | POA: Diagnosis not present

## 2021-09-18 DIAGNOSIS — H6123 Impacted cerumen, bilateral: Secondary | ICD-10-CM | POA: Insufficient documentation

## 2021-09-23 DIAGNOSIS — C61 Malignant neoplasm of prostate: Secondary | ICD-10-CM | POA: Diagnosis not present

## 2021-09-23 DIAGNOSIS — C7951 Secondary malignant neoplasm of bone: Secondary | ICD-10-CM | POA: Diagnosis not present

## 2021-10-19 DIAGNOSIS — Z7901 Long term (current) use of anticoagulants: Secondary | ICD-10-CM | POA: Diagnosis not present

## 2021-10-21 DIAGNOSIS — C7951 Secondary malignant neoplasm of bone: Secondary | ICD-10-CM | POA: Diagnosis not present

## 2021-10-21 DIAGNOSIS — C61 Malignant neoplasm of prostate: Secondary | ICD-10-CM | POA: Diagnosis not present

## 2021-11-13 DIAGNOSIS — R310 Gross hematuria: Secondary | ICD-10-CM | POA: Diagnosis not present

## 2021-11-17 DIAGNOSIS — Z7901 Long term (current) use of anticoagulants: Secondary | ICD-10-CM | POA: Diagnosis not present

## 2021-11-18 DIAGNOSIS — C7951 Secondary malignant neoplasm of bone: Secondary | ICD-10-CM | POA: Diagnosis not present

## 2021-11-18 DIAGNOSIS — C61 Malignant neoplasm of prostate: Secondary | ICD-10-CM | POA: Diagnosis not present

## 2021-11-26 DIAGNOSIS — H0102B Squamous blepharitis left eye, upper and lower eyelids: Secondary | ICD-10-CM | POA: Diagnosis not present

## 2021-11-26 DIAGNOSIS — H0102A Squamous blepharitis right eye, upper and lower eyelids: Secondary | ICD-10-CM | POA: Diagnosis not present

## 2021-11-26 DIAGNOSIS — H40013 Open angle with borderline findings, low risk, bilateral: Secondary | ICD-10-CM | POA: Diagnosis not present

## 2021-11-26 DIAGNOSIS — Q141 Congenital malformation of retina: Secondary | ICD-10-CM | POA: Diagnosis not present

## 2021-11-26 DIAGNOSIS — H353131 Nonexudative age-related macular degeneration, bilateral, early dry stage: Secondary | ICD-10-CM | POA: Diagnosis not present

## 2021-11-26 DIAGNOSIS — H04123 Dry eye syndrome of bilateral lacrimal glands: Secondary | ICD-10-CM | POA: Diagnosis not present

## 2021-11-26 DIAGNOSIS — Z961 Presence of intraocular lens: Secondary | ICD-10-CM | POA: Diagnosis not present

## 2021-12-09 DIAGNOSIS — C61 Malignant neoplasm of prostate: Secondary | ICD-10-CM | POA: Diagnosis not present

## 2021-12-14 DIAGNOSIS — Z7901 Long term (current) use of anticoagulants: Secondary | ICD-10-CM | POA: Diagnosis not present

## 2021-12-14 DIAGNOSIS — I10 Essential (primary) hypertension: Secondary | ICD-10-CM | POA: Diagnosis not present

## 2021-12-16 DIAGNOSIS — C7951 Secondary malignant neoplasm of bone: Secondary | ICD-10-CM | POA: Diagnosis not present

## 2021-12-16 DIAGNOSIS — C61 Malignant neoplasm of prostate: Secondary | ICD-10-CM | POA: Diagnosis not present

## 2021-12-23 ENCOUNTER — Telehealth: Payer: Self-pay | Admitting: Hematology and Oncology

## 2021-12-23 NOTE — Telephone Encounter (Signed)
Scheduled appointment per referral. Patient is aware of appointment date and time. Patient is aware to arrive 15 mins prior to appointment time and to bring updated insurance cards. Patient is aware of location.   

## 2021-12-28 ENCOUNTER — Other Ambulatory Visit (HOSPITAL_COMMUNITY): Payer: Self-pay | Admitting: Urology

## 2021-12-28 DIAGNOSIS — C7951 Secondary malignant neoplasm of bone: Secondary | ICD-10-CM

## 2021-12-28 DIAGNOSIS — C61 Malignant neoplasm of prostate: Secondary | ICD-10-CM

## 2021-12-29 ENCOUNTER — Inpatient Hospital Stay: Payer: Medicare Other | Attending: Hematology and Oncology | Admitting: Hematology and Oncology

## 2021-12-29 ENCOUNTER — Inpatient Hospital Stay: Payer: Medicare Other

## 2021-12-29 ENCOUNTER — Other Ambulatory Visit: Payer: Medicare Other

## 2021-12-29 ENCOUNTER — Other Ambulatory Visit: Payer: Self-pay

## 2021-12-29 ENCOUNTER — Ambulatory Visit: Payer: Medicare Other | Admitting: Hematology and Oncology

## 2021-12-29 VITALS — BP 150/60 | HR 66 | Temp 97.9°F | Resp 16 | Ht 64.0 in | Wt 142.4 lb

## 2021-12-29 DIAGNOSIS — C61 Malignant neoplasm of prostate: Secondary | ICD-10-CM | POA: Diagnosis not present

## 2021-12-29 DIAGNOSIS — Z79899 Other long term (current) drug therapy: Secondary | ICD-10-CM | POA: Insufficient documentation

## 2021-12-29 DIAGNOSIS — Z7901 Long term (current) use of anticoagulants: Secondary | ICD-10-CM | POA: Insufficient documentation

## 2021-12-29 DIAGNOSIS — Z86718 Personal history of other venous thrombosis and embolism: Secondary | ICD-10-CM | POA: Insufficient documentation

## 2021-12-29 DIAGNOSIS — C7951 Secondary malignant neoplasm of bone: Secondary | ICD-10-CM | POA: Insufficient documentation

## 2021-12-29 LAB — CBC WITH DIFFERENTIAL/PLATELET
Abs Immature Granulocytes: 0.01 10*3/uL (ref 0.00–0.07)
Basophils Absolute: 0 10*3/uL (ref 0.0–0.1)
Basophils Relative: 1 %
Eosinophils Absolute: 0.1 10*3/uL (ref 0.0–0.5)
Eosinophils Relative: 2 %
HCT: 38.1 % — ABNORMAL LOW (ref 39.0–52.0)
Hemoglobin: 12.3 g/dL — ABNORMAL LOW (ref 13.0–17.0)
Immature Granulocytes: 0 %
Lymphocytes Relative: 31 %
Lymphs Abs: 1.9 10*3/uL (ref 0.7–4.0)
MCH: 28.8 pg (ref 26.0–34.0)
MCHC: 32.3 g/dL (ref 30.0–36.0)
MCV: 89.2 fL (ref 80.0–100.0)
Monocytes Absolute: 0.6 10*3/uL (ref 0.1–1.0)
Monocytes Relative: 10 %
Neutro Abs: 3.4 10*3/uL (ref 1.7–7.7)
Neutrophils Relative %: 56 %
Platelets: 160 10*3/uL (ref 150–400)
RBC: 4.27 MIL/uL (ref 4.22–5.81)
RDW: 13.5 % (ref 11.5–15.5)
WBC: 6 10*3/uL (ref 4.0–10.5)
nRBC: 0 % (ref 0.0–0.2)

## 2021-12-29 LAB — COMPREHENSIVE METABOLIC PANEL
ALT: 31 U/L (ref 0–44)
AST: 34 U/L (ref 15–41)
Albumin: 4.1 g/dL (ref 3.5–5.0)
Alkaline Phosphatase: 56 U/L (ref 38–126)
Anion gap: 4 — ABNORMAL LOW (ref 5–15)
BUN: 16 mg/dL (ref 8–23)
CO2: 30 mmol/L (ref 22–32)
Calcium: 9.5 mg/dL (ref 8.9–10.3)
Chloride: 108 mmol/L (ref 98–111)
Creatinine, Ser: 1.12 mg/dL (ref 0.61–1.24)
GFR, Estimated: >60 mL/min (ref >60)
Glucose, Bld: 77 mg/dL (ref 70–99)
Potassium: 4.6 mmol/L (ref 3.5–5.1)
Sodium: 142 mmol/L (ref 135–145)
Total Bilirubin: 0.4 mg/dL (ref 0.3–1.2)
Total Protein: 6.9 g/dL (ref 6.5–8.1)

## 2021-12-29 NOTE — Progress Notes (Unsigned)
[facility] NOTE  Patient Care Team: Wenda Low, MD as PCP - General (Internal Medicine)  CHIEF COMPLAINTS/PURPOSE OF CONSULTATION:  Newly diagnosed breast cancer  HISTORY OF PRESENTING ILLNESS:  Craig Freeman 88 y.o. male is here because of recent diagnosis of {left/right:[id number]}   I reviewed her records extensively and collaborated the history with the patient.  SUMMARY OF ONCOLOGIC HISTORY: Oncology History   No history exists.     MEDICAL HISTORY:  Past Medical History:  Diagnosis Date   Constipation    DVT (deep venous thrombosis), right    coumadin   High cholesterol    Hyperlipidemia    Hypertension     SURGICAL HISTORY: Past Surgical History:  Procedure Laterality Date   JOINT REPLACEMENT      SOCIAL HISTORY: Social History   Socioeconomic History   Marital status: Married    Spouse name: Not on file   Number of children: Not on file   Years of education: Not on file   Highest education level: Not on file  Occupational History   Not on file  Tobacco Use   Smoking status: Never   Smokeless tobacco: Never  Substance and Sexual Activity   Alcohol use: No   Drug use: No   Sexual activity: Not Currently  Other Topics Concern   Not on file  Social History Narrative   Not on file   Social Determinants of Health   Financial Resource Strain: Not on file  Food Insecurity: Not on file  Transportation Needs: Not on file  Physical Activity: Not on file  Stress: Not on file  Social Connections: Not on file  Intimate Partner Violence: Not on file    FAMILY HISTORY: No family history on file.  ALLERGIES:  has No Known Allergies.  MEDICATIONS:  Current Outpatient Medications  Medication Sig Dispense Refill   albuterol (PROVENTIL HFA;VENTOLIN HFA) 108 (90 BASE) MCG/ACT inhaler Inhale 2 puffs into the lungs every 6 (six) hours as needed. Shortness of breath     atenolol (TENORMIN) 50 MG tablet       atenolol-chlorthalidone (TENORETIC) 50-25 MG per tablet Take 1 tablet by mouth daily. 30 tablet 5   atorvastatin (LIPITOR) 40 MG tablet Take 40 mg by mouth daily.     chlorthalidone (HYGROTON) 25 MG tablet Take 25 mg by mouth daily.     Cholecalciferol (VITAMIN D PO) Take 1 tablet by mouth daily.     CVS MELATONIN 5 MG TABS 1 TABLET AT BEDTIME AS NEEDED WITH FOOD ONCE A DAY AT BEDTIME ORALLY 30     DENTA 5000 PLUS 1.1 % CREA dental cream      Famotidine (PEPCID PO) Take by mouth.     famotidine (PEPCID) 20 MG tablet Take 20 mg by mouth 2 (two) times daily.     ferrous sulfate 325 (65 FE) MG tablet Take 325 mg by mouth daily with breakfast.     finasteride (PROSCAR) 5 MG tablet Take 5 mg by mouth daily.     fluticasone (FLONASE) 50 MCG/ACT nasal spray INHALE 2 PUFFS EACH NOSTRIL EVERY DAY 30     Fluticasone-Salmeterol (ADVAIR) 100-50 MCG/DOSE AEPB Inhale 1 puff into the lungs every 12 (twelve) hours. Shortness of breath     KLOR-CON M10 10 MEQ tablet Take 20 mEq by mouth daily.     levocetirizine (XYZAL) 5 MG tablet every evening.     potassium chloride (K-DUR) 10 MEQ tablet Take 2 tablets (20 mEq total) by  mouth 2 (two) times daily. 120 tablet 6   triamcinolone cream (KENALOG) 0.1 % Apply 1 Application topically 2 (two) times daily. 30 g 0   warfarin (COUMADIN) 5 MG tablet Take 5 mg by mouth daily.     No current facility-administered medications for this visit.    REVIEW OF SYSTEMS:   Constitutional: Denies fevers, chills or abnormal night sweats Eyes: Denies blurriness of vision, double vision or watery eyes Ears, nose, mouth, throat, and face: Denies mucositis or sore throat Respiratory: Denies cough, dyspnea or wheezes Cardiovascular: Denies palpitation, chest discomfort or lower extremity swelling Gastrointestinal:  Denies nausea, heartburn or change in bowel habits Skin: Denies abnormal skin rashes Lymphatics: Denies new lymphadenopathy or easy bruising Neurological:Denies  numbness, tingling or new weaknesses Behavioral/Psych: Mood is stable, no new changes  Breast: *** Denies any palpable lumps or discharge All other systems were reviewed with the patient and are negative.  PHYSICAL EXAMINATION: ECOG PERFORMANCE STATUS: {CHL ONC ECOG QZ:[id number]}  Vitals:   12/29/21 1423  BP: (!) 150/60  Pulse: 66  Resp: 16  Temp: 97.9 F (36.6 C)  SpO2: 100%   Filed Weights   12/29/21 1423  Weight: 142 lb 6.4 oz (64.6 kg)    GENERAL:alert, no distress and comfortable SKIN: skin color, texture, turgor are normal, no rashes or significant lesions EYES: normal, conjunctiva are pink and non-injected, sclera clear OROPHARYNX:no exudate, no erythema and lips, buccal mucosa, and tongue normal  NECK: supple, thyroid normal size, non-tender, without nodularity LYMPH:  no palpable lymphadenopathy in the cervical, axillary or inguinal LUNGS: clear to auscultation and percussion with normal breathing effort HEART: regular rate & rhythm and no murmurs and no lower extremity edema ABDOMEN:abdomen soft, non-tender and normal bowel sounds Musculoskeletal:no cyanosis of digits and no clubbing  PSYCH: alert & oriented x 3 with fluent speech NEURO: no focal motor/sensory deficits BREAST:*** No palpable nodules in breast. No palpable axillary or supraclavicular lymphadenopathy (exam performed in the presence of a chaperone)   LABORATORY DATA:  I have reviewed the data as listed Lab Results  Component Value Date   WBC 8.9 06/15/2011   HGB 14.0 06/15/2011   HCT 42.4 06/15/2011   MCV 85.0 06/15/2011   PLT 142 (L) 06/15/2011   Lab Results  Component Value Date   NA 139 06/16/2011   K 3.3 (L) 06/16/2011   CL 101 06/16/2011   CO2 30 06/16/2011    RADIOGRAPHIC STUDIES: I have personally reviewed the radiological reports and agreed with the findings in the report.  ASSESSMENT AND PLAN:  No problem-specific Assessment & Plan notes found for this encounter.   All  questions were answered. The patient knows to call the clinic with any problems, questions or concerns.    [name], MD 12/29/21 [place] NOTE  Patient Care Team: Wenda Low, MD as PCP - General (Internal Medicine)  CHIEF COMPLAINTS/PURPOSE OF CONSULTATION:  ***  ASSESSMENT & PLAN:  No problem-specific Assessment & Plan notes found for this encounter.  No orders of the defined types were placed in this encounter.    HISTORY OF PRESENTING ILLNESS:  Craig Freeman 88 y.o. male is here because of ***  REVIEW OF SYSTEMS:   Constitutional: Denies fevers, chills or abnormal night sweats Eyes: Denies blurriness of vision, double vision or watery eyes Ears, nose, mouth, throat, and face: Denies mucositis or sore throat Respiratory: Denies cough, dyspnea or wheezes Cardiovascular: Denies palpitation, chest discomfort or lower extremity swelling Gastrointestinal:  Denies nausea, heartburn or change in bowel habits Skin: Denies abnormal skin rashes Lymphatics: Denies new lymphadenopathy or easy bruising Neurological:Denies numbness, tingling or new weaknesses Behavioral/Psych: Mood is stable, no new changes  All other systems were reviewed with the patient and are negative.  MEDICAL HISTORY:  Past Medical History:  Diagnosis Date   Constipation    DVT (deep venous thrombosis), right    coumadin   High cholesterol    Hyperlipidemia    Hypertension     SURGICAL HISTORY: Past Surgical History:  Procedure Laterality Date   JOINT REPLACEMENT      SOCIAL HISTORY: Social History   Socioeconomic History   Marital status: Married    Spouse name: Not on file   Number of children: Not on file   Years of education: Not on file   Highest education level: Not on file  Occupational History   Not on file  Tobacco Use   Smoking status: Never   Smokeless tobacco: Never  Substance and Sexual Activity   Alcohol use: No   Drug use: No    Sexual activity: Not Currently  Other Topics Concern   Not on file  Social History Narrative   Not on file   Social Determinants of Health   Financial Resource Strain: Not on file  Food Insecurity: Not on file  Transportation Needs: Not on file  Physical Activity: Not on file  Stress: Not on file  Social Connections: Not on file  Intimate Partner Violence: Not on file    FAMILY HISTORY: No family history on file.  ALLERGIES:  has No Known Allergies.  MEDICATIONS:  Current Outpatient Medications  Medication Sig Dispense Refill   albuterol (PROVENTIL HFA;VENTOLIN HFA) 108 (90 BASE) MCG/ACT inhaler Inhale 2 puffs into the lungs every 6 (six) hours as needed. Shortness of breath     atenolol (TENORMIN) 50 MG tablet      atenolol-chlorthalidone (TENORETIC) 50-25 MG per tablet Take 1 tablet by mouth daily. 30 tablet 5   atorvastatin (LIPITOR) 40 MG tablet Take 40 mg by mouth daily.     chlorthalidone (HYGROTON) 25 MG tablet Take 25 mg by mouth daily.     Cholecalciferol (VITAMIN D PO) Take 1 tablet by mouth daily.     CVS MELATONIN 5 MG TABS 1 TABLET AT BEDTIME AS NEEDED WITH FOOD ONCE A DAY AT BEDTIME ORALLY 30     DENTA 5000 PLUS 1.1 % CREA dental cream      Famotidine (PEPCID PO) Take by mouth.     famotidine (PEPCID) 20 MG tablet Take 20 mg by mouth 2 (two) times daily.     ferrous sulfate 325 (65 FE) MG tablet Take 325 mg by mouth daily with breakfast.     finasteride (PROSCAR) 5 MG tablet Take 5 mg by mouth daily.     fluticasone (FLONASE) 50 MCG/ACT nasal spray INHALE 2 PUFFS EACH NOSTRIL EVERY DAY 30     Fluticasone-Salmeterol (ADVAIR) 100-50 MCG/DOSE AEPB Inhale 1 puff into the lungs every 12 (twelve) hours. Shortness of breath     KLOR-CON M10 10 MEQ tablet Take 20 mEq by mouth daily.     levocetirizine (XYZAL) 5 MG tablet every evening.     potassium chloride (K-DUR) 10 MEQ tablet Take 2 tablets (20 mEq total) by mouth 2 (two) times daily. 120 tablet 6   triamcinolone  cream (KENALOG) 0.1 % Apply 1 Application topically 2 (two) times daily. 30 g 0   warfarin (COUMADIN) 5 MG  tablet Take 5 mg by mouth daily.     No current facility-administered medications for this visit.     PHYSICAL EXAMINATION: ECOG PERFORMANCE STATUS: {CHL ONC ECOG PS:[id number]}  There were no vitals filed for this visit. There were no vitals filed for this visit.  GENERAL:alert, no distress and comfortable SKIN: skin color, texture, turgor are normal, no rashes or significant lesions EYES: normal, conjunctiva are pink and non-injected, sclera clear OROPHARYNX:no exudate, no erythema and lips, buccal mucosa, and tongue normal  NECK: supple, thyroid normal size, non-tender, without nodularity LYMPH:  no palpable lymphadenopathy in the cervical, axillary or inguinal LUNGS: clear to auscultation and percussion with normal breathing effort HEART: regular rate & rhythm and no murmurs and no lower extremity edema ABDOMEN:abdomen soft, non-tender and normal bowel sounds Musculoskeletal:no cyanosis of digits and no clubbing  PSYCH: alert & oriented x 3 with fluent speech NEURO: no focal motor/sensory deficits  LABORATORY DATA:  I have reviewed the data as listed Lab Results  Component Value Date   WBC 8.9 06/15/2011   HGB 14.0 06/15/2011   HCT 42.4 06/15/2011   MCV 85.0 06/15/2011   PLT 142 (L) 06/15/2011     Chemistry      Component Value Date/Time   NA 139 06/16/2011 0418   K 3.3 (L) 06/16/2011 0418   CL 101 06/16/2011 0418   CO2 30 06/16/2011 0418   BUN 11 06/16/2011 0418   CREATININE 1.07 06/16/2011 0418      Component Value Date/Time   CALCIUM 9.1 06/16/2011 0418   ALKPHOS 55 06/13/2011 2330   AST 34 06/13/2011 2330   ALT 33 06/13/2011 2330   BILITOT 0.3 06/13/2011 2330       RADIOGRAPHIC STUDIES: I have personally reviewed the radiological images as listed and agreed with the findings in the report. No results found.  All questions were answered. The  patient knows to call the clinic with any problems, questions or concerns. I spent *** minutes in the care of this patient including H and P, review of records, counseling and coordination of care.     [name], MD 12/29/2021 2:19 PM

## 2021-12-30 ENCOUNTER — Encounter: Payer: Self-pay | Admitting: Hematology and Oncology

## 2021-12-30 DIAGNOSIS — C61 Malignant neoplasm of prostate: Secondary | ICD-10-CM | POA: Insufficient documentation

## 2021-12-30 HISTORY — DX: Malignant neoplasm of prostate: C61

## 2021-12-30 NOTE — Assessment & Plan Note (Signed)
This is a very pleasant 88 year old male patient with newly diagnosed castrate resistant metastatic prostate cancer referred to medical oncology for recommendations.  Patient was diagnosed with metastatic prostate cancer back in October 2022 with a PSA of 331 at baseline currently on ADT and Nubeqa.  He was not on full dose of Nubeqa due to funding and he most recently was titrated to full dose in August or September 2023.  His PSA back in November was 24.5 hence he was referred to medical oncology for additional recommendations.  He has PSMA PET/CT ordered by Dr. Junious Silk which is not scheduled. We have discussed about proceeding with PSMA PET/CT.  We can consider Guardant360 testing to look for BRCA mutation to see if he is a candidate for PARP inhibitors.  If he is not a candidate for PARP inhibitors, his other options for treatment include chemotherapy versus Xofigo if he has bone only disease.  He will return to clinic in 2 to 3 weeks after his PSMA PET to review treatment options and recommendations.  Even if he were to need chemotherapy, I think he has a robust PS and he will be able to tolerate docetaxel well. Thank you for consulting [place] in the care of this patient.  Please do not hesitate to contact us with any additional questions or concerns.

## 2021-12-31 LAB — PSA, TOTAL AND FREE
PSA, Free Pct: 24.4 %
PSA, Free: 8.7 ng/mL
Prostate Specific Ag, Serum: 35.6 ng/mL — ABNORMAL HIGH (ref 0.0–4.0)

## 2022-01-04 ENCOUNTER — Telehealth: Payer: Self-pay | Admitting: Hematology and Oncology

## 2022-01-04 NOTE — Telephone Encounter (Signed)
Called patient, no answer. Called daughter to discuss about PSA. We will recommend waiting to have the PSMA pet scan done so we can come with treatment options. She will call urology office as well to get the PET scan going.  Craig Freeman

## 2022-01-07 DIAGNOSIS — N182 Chronic kidney disease, stage 2 (mild): Secondary | ICD-10-CM | POA: Diagnosis not present

## 2022-01-07 DIAGNOSIS — D696 Thrombocytopenia, unspecified: Secondary | ICD-10-CM | POA: Diagnosis not present

## 2022-01-07 DIAGNOSIS — J45909 Unspecified asthma, uncomplicated: Secondary | ICD-10-CM | POA: Diagnosis not present

## 2022-01-07 DIAGNOSIS — E7800 Pure hypercholesterolemia, unspecified: Secondary | ICD-10-CM | POA: Diagnosis not present

## 2022-01-07 DIAGNOSIS — I10 Essential (primary) hypertension: Secondary | ICD-10-CM | POA: Diagnosis not present

## 2022-01-07 DIAGNOSIS — J309 Allergic rhinitis, unspecified: Secondary | ICD-10-CM | POA: Diagnosis not present

## 2022-01-07 DIAGNOSIS — C61 Malignant neoplasm of prostate: Secondary | ICD-10-CM | POA: Diagnosis not present

## 2022-01-07 DIAGNOSIS — Z7901 Long term (current) use of anticoagulants: Secondary | ICD-10-CM | POA: Diagnosis not present

## 2022-01-07 DIAGNOSIS — R7303 Prediabetes: Secondary | ICD-10-CM | POA: Diagnosis not present

## 2022-01-11 DIAGNOSIS — N182 Chronic kidney disease, stage 2 (mild): Secondary | ICD-10-CM | POA: Diagnosis not present

## 2022-01-11 DIAGNOSIS — I10 Essential (primary) hypertension: Secondary | ICD-10-CM | POA: Diagnosis not present

## 2022-01-13 DIAGNOSIS — C7951 Secondary malignant neoplasm of bone: Secondary | ICD-10-CM | POA: Diagnosis not present

## 2022-01-13 DIAGNOSIS — C61 Malignant neoplasm of prostate: Secondary | ICD-10-CM | POA: Diagnosis not present

## 2022-01-20 ENCOUNTER — Telehealth: Payer: Self-pay | Admitting: *Deleted

## 2022-01-20 NOTE — Telephone Encounter (Signed)
Pt daughter called to reschedule pt visit with Dr.Iruku due to pt appt for PET scan being scheduled for after office and lab visit. Pt was rescheduled for 02/02/2022 at 1545. Pt daughter Caren Griffins verbalized understanding. Advised if there are sooner appts we will touch bases to come in sooner. Pt daughter verbalized understanding.

## 2022-01-26 ENCOUNTER — Other Ambulatory Visit: Payer: Self-pay

## 2022-01-26 ENCOUNTER — Ambulatory Visit: Payer: Medicare Other | Admitting: Hematology and Oncology

## 2022-01-26 ENCOUNTER — Inpatient Hospital Stay: Payer: Medicare Other | Attending: Hematology and Oncology

## 2022-01-26 DIAGNOSIS — Z79899 Other long term (current) drug therapy: Secondary | ICD-10-CM | POA: Diagnosis not present

## 2022-01-26 DIAGNOSIS — C7951 Secondary malignant neoplasm of bone: Secondary | ICD-10-CM | POA: Diagnosis not present

## 2022-01-26 DIAGNOSIS — K5790 Diverticulosis of intestine, part unspecified, without perforation or abscess without bleeding: Secondary | ICD-10-CM | POA: Diagnosis not present

## 2022-01-26 DIAGNOSIS — Z86718 Personal history of other venous thrombosis and embolism: Secondary | ICD-10-CM | POA: Diagnosis not present

## 2022-01-26 DIAGNOSIS — Z880 Allergy status to penicillin: Secondary | ICD-10-CM | POA: Insufficient documentation

## 2022-01-26 DIAGNOSIS — K625 Hemorrhage of anus and rectum: Secondary | ICD-10-CM | POA: Diagnosis not present

## 2022-01-26 DIAGNOSIS — C61 Malignant neoplasm of prostate: Secondary | ICD-10-CM | POA: Diagnosis not present

## 2022-01-26 DIAGNOSIS — Z7901 Long term (current) use of anticoagulants: Secondary | ICD-10-CM | POA: Insufficient documentation

## 2022-01-26 DIAGNOSIS — Z881 Allergy status to other antibiotic agents status: Secondary | ICD-10-CM | POA: Insufficient documentation

## 2022-01-26 LAB — COMPREHENSIVE METABOLIC PANEL
ALT: 17 U/L (ref 0–44)
AST: 22 U/L (ref 15–41)
Albumin: 3.9 g/dL (ref 3.5–5.0)
Alkaline Phosphatase: 34 U/L — ABNORMAL LOW (ref 38–126)
Anion gap: 6 (ref 5–15)
BUN: 14 mg/dL (ref 8–23)
CO2: 26 mmol/L (ref 22–32)
Calcium: 9.2 mg/dL (ref 8.9–10.3)
Chloride: 109 mmol/L (ref 98–111)
Creatinine, Ser: 1 mg/dL (ref 0.61–1.24)
GFR, Estimated: >60 mL/min (ref >60)
Glucose, Bld: 84 mg/dL (ref 70–99)
Potassium: 4.1 mmol/L (ref 3.5–5.1)
Sodium: 141 mmol/L (ref 135–145)
Total Bilirubin: 0.7 mg/dL (ref 0.3–1.2)
Total Protein: 7.2 g/dL (ref 6.5–8.1)

## 2022-01-26 LAB — CBC WITH DIFFERENTIAL/PLATELET
Abs Immature Granulocytes: 0 10*3/uL (ref 0.00–0.07)
Basophils Absolute: 0 10*3/uL (ref 0.0–0.1)
Basophils Relative: 1 %
Eosinophils Absolute: 0.1 10*3/uL (ref 0.0–0.5)
Eosinophils Relative: 2 %
HCT: 35.8 % — ABNORMAL LOW (ref 39.0–52.0)
Hemoglobin: 11.5 g/dL — ABNORMAL LOW (ref 13.0–17.0)
Immature Granulocytes: 0 %
Lymphocytes Relative: 27 %
Lymphs Abs: 1.7 10*3/uL (ref 0.7–4.0)
MCH: 28.3 pg (ref 26.0–34.0)
MCHC: 32.1 g/dL (ref 30.0–36.0)
MCV: 88 fL (ref 80.0–100.0)
Monocytes Absolute: 0.7 10*3/uL (ref 0.1–1.0)
Monocytes Relative: 11 %
Neutro Abs: 3.7 10*3/uL (ref 1.7–7.7)
Neutrophils Relative %: 59 %
Platelets: 151 10*3/uL (ref 150–400)
RBC: 4.07 MIL/uL — ABNORMAL LOW (ref 4.22–5.81)
RDW: 13.3 % (ref 11.5–15.5)
Smear Review: NORMAL
WBC: 6.2 10*3/uL (ref 4.0–10.5)
nRBC: 0 % (ref 0.0–0.2)

## 2022-01-27 ENCOUNTER — Ambulatory Visit (HOSPITAL_COMMUNITY)
Admission: RE | Admit: 2022-01-27 | Discharge: 2022-01-27 | Disposition: A | Discharge status: Home / Self Care | Payer: Medicare Other | Source: Ambulatory Visit | Attending: Urology | Admitting: Urology

## 2022-01-27 DIAGNOSIS — C7951 Secondary malignant neoplasm of bone: Secondary | ICD-10-CM | POA: Diagnosis not present

## 2022-01-27 DIAGNOSIS — I700 Atherosclerosis of aorta: Secondary | ICD-10-CM | POA: Diagnosis not present

## 2022-01-27 DIAGNOSIS — C61 Malignant neoplasm of prostate: Secondary | ICD-10-CM | POA: Diagnosis not present

## 2022-01-27 DIAGNOSIS — I2510 Atherosclerotic heart disease of native coronary artery without angina pectoris: Secondary | ICD-10-CM | POA: Diagnosis not present

## 2022-01-27 MED ORDER — PIFLIFOLASTAT F 18 (PYLARIFY) INJECTION
9.0000 | Freq: Once | INTRAVENOUS | Status: AC
  Administered 2022-01-27: 9 via INTRAVENOUS

## 2022-02-02 ENCOUNTER — Encounter: Payer: Self-pay | Admitting: *Deleted

## 2022-02-02 ENCOUNTER — Inpatient Hospital Stay: Payer: Medicare Other | Admitting: Hematology and Oncology

## 2022-02-02 NOTE — Progress Notes (Signed)
Received mychart message from pt with complaint of ongoing left lower back pain.  Pt denies recent injury, trauma or UTI symptoms.  Per MD pt needing labs and [facility] visit.  Appt scheduled, pt verbalized understanding of appt date and time.    

## 2022-02-03 ENCOUNTER — Telehealth: Payer: Self-pay

## 2022-02-03 NOTE — Telephone Encounter (Signed)
Pt's daughter, Caren Griffins called 02/02/22 to r/s appt with MD, due to inclement weather. Advised scheduling to call and R/S pt. Caren Griffins called back and states she has not heard from scheduling. This nurse r/s pt for 02/09/22 at 0930. She is in agreement and knows to call with any further concerns.

## 2022-02-04 DIAGNOSIS — Z7901 Long term (current) use of anticoagulants: Secondary | ICD-10-CM | POA: Diagnosis not present

## 2022-02-04 DIAGNOSIS — I10 Essential (primary) hypertension: Secondary | ICD-10-CM | POA: Diagnosis not present

## 2022-02-06 ENCOUNTER — Encounter (HOSPITAL_COMMUNITY): Payer: Self-pay

## 2022-02-06 ENCOUNTER — Other Ambulatory Visit: Payer: Self-pay

## 2022-02-06 ENCOUNTER — Inpatient Hospital Stay (HOSPITAL_COMMUNITY)
Admission: EM | Admit: 2022-02-06 | Discharge: 2022-02-08 | DRG: 378 | Disposition: A | Discharge status: Home / Self Care | Payer: Medicare Other | Attending: Internal Medicine | Admitting: Internal Medicine

## 2022-02-06 DIAGNOSIS — Z7951 Long term (current) use of inhaled steroids: Secondary | ICD-10-CM

## 2022-02-06 DIAGNOSIS — R58 Hemorrhage, not elsewhere classified: Secondary | ICD-10-CM | POA: Diagnosis not present

## 2022-02-06 DIAGNOSIS — E785 Hyperlipidemia, unspecified: Secondary | ICD-10-CM | POA: Diagnosis present

## 2022-02-06 DIAGNOSIS — E7800 Pure hypercholesterolemia, unspecified: Secondary | ICD-10-CM | POA: Diagnosis present

## 2022-02-06 DIAGNOSIS — Z8672 Personal history of thrombophlebitis: Secondary | ICD-10-CM | POA: Diagnosis not present

## 2022-02-06 DIAGNOSIS — Z79899 Other long term (current) drug therapy: Secondary | ICD-10-CM

## 2022-02-06 DIAGNOSIS — K644 Residual hemorrhoidal skin tags: Secondary | ICD-10-CM | POA: Diagnosis present

## 2022-02-06 DIAGNOSIS — I2510 Atherosclerotic heart disease of native coronary artery without angina pectoris: Secondary | ICD-10-CM | POA: Diagnosis present

## 2022-02-06 DIAGNOSIS — K625 Hemorrhage of anus and rectum: Secondary | ICD-10-CM | POA: Diagnosis not present

## 2022-02-06 DIAGNOSIS — Z881 Allergy status to other antibiotic agents status: Secondary | ICD-10-CM | POA: Diagnosis not present

## 2022-02-06 DIAGNOSIS — K922 Gastrointestinal hemorrhage, unspecified: Secondary | ICD-10-CM | POA: Diagnosis present

## 2022-02-06 DIAGNOSIS — C7951 Secondary malignant neoplasm of bone: Secondary | ICD-10-CM | POA: Diagnosis not present

## 2022-02-06 DIAGNOSIS — K5730 Diverticulosis of large intestine without perforation or abscess without bleeding: Secondary | ICD-10-CM | POA: Diagnosis not present

## 2022-02-06 DIAGNOSIS — K5731 Diverticulosis of large intestine without perforation or abscess with bleeding: Secondary | ICD-10-CM | POA: Diagnosis present

## 2022-02-06 DIAGNOSIS — I358 Other nonrheumatic aortic valve disorders: Secondary | ICD-10-CM | POA: Diagnosis present

## 2022-02-06 DIAGNOSIS — E7849 Other hyperlipidemia: Secondary | ICD-10-CM | POA: Diagnosis not present

## 2022-02-06 DIAGNOSIS — C7989 Secondary malignant neoplasm of other specified sites: Secondary | ICD-10-CM | POA: Diagnosis not present

## 2022-02-06 DIAGNOSIS — I10 Essential (primary) hypertension: Secondary | ICD-10-CM | POA: Diagnosis present

## 2022-02-06 DIAGNOSIS — I700 Atherosclerosis of aorta: Secondary | ICD-10-CM | POA: Diagnosis present

## 2022-02-06 DIAGNOSIS — Z7901 Long term (current) use of anticoagulants: Secondary | ICD-10-CM | POA: Diagnosis not present

## 2022-02-06 DIAGNOSIS — Z8546 Personal history of malignant neoplasm of prostate: Secondary | ICD-10-CM

## 2022-02-06 DIAGNOSIS — Z86718 Personal history of other venous thrombosis and embolism: Secondary | ICD-10-CM

## 2022-02-06 DIAGNOSIS — C61 Malignant neoplasm of prostate: Secondary | ICD-10-CM | POA: Diagnosis present

## 2022-02-06 DIAGNOSIS — D630 Anemia in neoplastic disease: Secondary | ICD-10-CM | POA: Diagnosis present

## 2022-02-06 DIAGNOSIS — C801 Malignant (primary) neoplasm, unspecified: Secondary | ICD-10-CM | POA: Diagnosis not present

## 2022-02-06 HISTORY — DX: Malignant (primary) neoplasm, unspecified: C80.1

## 2022-02-06 LAB — HEMOGLOBIN AND HEMATOCRIT, BLOOD
HCT: 33.3 % — ABNORMAL LOW (ref 39.0–52.0)
HCT: 32.7 % — ABNORMAL LOW (ref 39.0–52.0)
Hemoglobin: 10.5 g/dL — ABNORMAL LOW (ref 13.0–17.0)
Hemoglobin: 10.1 g/dL — ABNORMAL LOW (ref 13.0–17.0)

## 2022-02-06 LAB — COMPREHENSIVE METABOLIC PANEL
ALT: 24 U/L (ref 0–44)
AST: 32 U/L (ref 15–41)
Albumin: 3.7 g/dL (ref 3.5–5.0)
Alkaline Phosphatase: 31 U/L — ABNORMAL LOW (ref 38–126)
Anion gap: 8 (ref 5–15)
BUN: 20 mg/dL (ref 8–23)
CO2: 24 mmol/L (ref 22–32)
Calcium: 9 mg/dL (ref 8.9–10.3)
Chloride: 107 mmol/L (ref 98–111)
Creatinine, Ser: 1.22 mg/dL (ref 0.61–1.24)
GFR, Estimated: 55 mL/min — ABNORMAL LOW (ref >60)
Glucose, Bld: 97 mg/dL (ref 70–99)
Potassium: 4.2 mmol/L (ref 3.5–5.1)
Sodium: 139 mmol/L (ref 135–145)
Total Bilirubin: 0.8 mg/dL (ref 0.3–1.2)
Total Protein: 6.3 g/dL — ABNORMAL LOW (ref 6.5–8.1)

## 2022-02-06 LAB — TYPE AND SCREEN
ABO/RH(D): A NEG
Antibody Screen: NEGATIVE

## 2022-02-06 LAB — CBC WITH DIFFERENTIAL/PLATELET
Abs Immature Granulocytes: 0.04 10*3/uL (ref 0.00–0.07)
Basophils Absolute: 0 10*3/uL (ref 0.0–0.1)
Basophils Relative: 0 %
Eosinophils Absolute: 0.1 10*3/uL (ref 0.0–0.5)
Eosinophils Relative: 1 %
HCT: 36.1 % — ABNORMAL LOW (ref 39.0–52.0)
Hemoglobin: 11.3 g/dL — ABNORMAL LOW (ref 13.0–17.0)
Immature Granulocytes: 1 %
Lymphocytes Relative: 20 %
Lymphs Abs: 1.7 10*3/uL (ref 0.7–4.0)
MCH: 28.3 pg (ref 26.0–34.0)
MCHC: 31.3 g/dL (ref 30.0–36.0)
MCV: 90.5 fL (ref 80.0–100.0)
Monocytes Absolute: 0.6 10*3/uL (ref 0.1–1.0)
Monocytes Relative: 8 %
Neutro Abs: 5.8 10*3/uL (ref 1.7–7.7)
Neutrophils Relative %: 70 %
Platelets: 162 10*3/uL (ref 150–400)
RBC: 3.99 MIL/uL — ABNORMAL LOW (ref 4.22–5.81)
RDW: 13.2 % (ref 11.5–15.5)
WBC: 8.2 10*3/uL (ref 4.0–10.5)
nRBC: 0 % (ref 0.0–0.2)

## 2022-02-06 LAB — PROTIME-INR
INR: 2.3 — ABNORMAL HIGH (ref 0.8–1.2)
Prothrombin Time: 25.3 seconds — ABNORMAL HIGH (ref 11.4–15.2)

## 2022-02-06 MED ORDER — TRAZODONE HCL 50 MG PO TABS
25.0000 mg | ORAL_TABLET | Freq: Every day | ORAL | Status: DC
  Administered 2022-02-06 – 2022-02-07 (×2): 25 mg via ORAL
  Filled 2022-02-06 (×2): qty 1

## 2022-02-06 MED ORDER — TRIAZOLAM 0.125 MG PO TABS: 0.1250 mg | ORAL_TABLET | Freq: Every evening | ORAL | Status: DC | PRN

## 2022-02-06 MED ORDER — POLYVINYL ALCOHOL 1.4 % OP SOLN
1.0000 [drp] | Freq: Every day | OPHTHALMIC | Status: DC
  Administered 2022-02-07 – 2022-02-08 (×2): 1 [drp] via OPHTHALMIC
  Filled 2022-02-06: qty 15

## 2022-02-06 MED ORDER — ACETAMINOPHEN 325 MG PO TABS: 650.0000 mg | ORAL_TABLET | Freq: Four times a day (QID) | ORAL | Status: DC | PRN

## 2022-02-06 MED ORDER — ONDANSETRON HCL 4 MG/2ML IJ SOLN: 4.0000 mg | Freq: Four times a day (QID) | INTRAMUSCULAR | Status: DC | PRN

## 2022-02-06 MED ORDER — ALBUTEROL SULFATE (2.5 MG/3ML) 0.083% IN NEBU: 2.5000 mg | INHALATION_SOLUTION | Freq: Four times a day (QID) | RESPIRATORY_TRACT | Status: DC | PRN

## 2022-02-06 MED ORDER — ACETAMINOPHEN 650 MG RE SUPP: 650.0000 mg | Freq: Four times a day (QID) | RECTAL | Status: DC | PRN

## 2022-02-06 MED ORDER — ATORVASTATIN CALCIUM 40 MG PO TABS
40.0000 mg | ORAL_TABLET | Freq: Every day | ORAL | Status: DC
  Administered 2022-02-06 – 2022-02-08 (×3): 40 mg via ORAL
  Filled 2022-02-06 (×3): qty 1

## 2022-02-06 MED ORDER — FLUTICASONE PROPIONATE 50 MCG/ACT NA SUSP
2.0000 | Freq: Every day | NASAL | Status: DC
  Administered 2022-02-07 – 2022-02-08 (×2): 2 via NASAL
  Filled 2022-02-06: qty 16

## 2022-02-06 MED ORDER — FERROUS SULFATE 325 (65 FE) MG PO TABS
325.0000 mg | ORAL_TABLET | Freq: Two times a day (BID) | ORAL | Status: DC
  Administered 2022-02-07 – 2022-02-08 (×3): 325 mg via ORAL
  Filled 2022-02-06 (×3): qty 1

## 2022-02-06 MED ORDER — ONDANSETRON HCL 4 MG PO TABS: 4.0000 mg | ORAL_TABLET | Freq: Four times a day (QID) | ORAL | Status: DC | PRN

## 2022-02-06 MED ORDER — OYSTER SHELL CALCIUM/D3 500-5 MG-MCG PO TABS
1.0000 | ORAL_TABLET | Freq: Two times a day (BID) | ORAL | Status: DC
  Administered 2022-02-07 – 2022-02-08 (×3): 1 via ORAL
  Filled 2022-02-06 (×4): qty 1

## 2022-02-06 MED ORDER — ALBUTEROL SULFATE HFA 108 (90 BASE) MCG/ACT IN AERS: 1.0000 | INHALATION_SPRAY | Freq: Four times a day (QID) | RESPIRATORY_TRACT | Status: DC | PRN

## 2022-02-06 MED ORDER — MOMETASONE FURO-FORMOTEROL FUM 100-5 MCG/ACT IN AERO
2.0000 | INHALATION_SPRAY | Freq: Two times a day (BID) | RESPIRATORY_TRACT | Status: DC
  Administered 2022-02-07 – 2022-02-08 (×3): 2 via RESPIRATORY_TRACT
  Filled 2022-02-06: qty 8.8

## 2022-02-06 MED ORDER — DAROLUTAMIDE 300 MG PO TABS: 600.0000 mg | ORAL_TABLET | Freq: Two times a day (BID) | ORAL | Status: DC

## 2022-02-06 NOTE — Progress Notes (Signed)
Prior-To-Admission Oral Chemotherapy for Treatment of Oncologic Disease   Order noted from Dr. Olevia Bowens to continue prior-to-admission oral chemotherapy regimen of darolutamide.  Procedure Per Pharmacy & Therapeutics Committee Policy: Orders for continuation of home oral chemotherapy for treatment of an oncologic disease will be held unless approved by an oncologist during current admission.    For patients receiving oncology care at [facility], inpatient pharmacist contacts patient's oncologist during regular office hours to review. If earlier review is medically necessary, attending physician consults [facility] on-call oncologist   For patients receiving oncology care outside of [facility], attending physician consults patient's oncologist to review. If this oncologist or their coverage cannot be reached, attending physician consults [facility] on-call oncologist   Oral chemotherapy continuation order is on hold pending oncologist review, [facility] oncologist Iruku will be notified by inpatient pharmacy during office hours   1/13 1855 -  informed Olevia Bowens who is agreeable to hold pending onc review     Thank you for allowing pharmacy to be a part of this patient's care.  Royetta Asal, PharmD, BCPS Clinical Pharmacist [place] Please utilize Amion for appropriate phone number to reach the unit pharmacist ([place]) 02/06/2022 7:12 PM

## 2022-02-06 NOTE — ED Provider Notes (Incomplete)
Elderly male currently on Coumadin for prior DVT presenting today after 3 episodes of bloody bowel movements this morning associated with some dizziness.  He denies any abdominal pain or dizziness at this time and is hemodynamically stable.  Dark clot noted at the rectum but no obvious source of bleeding such as inflamed hemorrhoids.  He has no prior history of GI bleeding or hemorrhoids in the past.  Concern for diverticular bleed versus AVM versus supratherapeutic INR.  Patient's INR today is 2.3, hemoglobin is stable at 11.  Plan will be to admit the patient for ongoing evaluation and trending hemoglobins.  Spoke with GI who will see the patient.  At this time unless he has recurrent bleeding they did not recommend reversal or imaging.  He has no abdominal pain at this time.

## 2022-02-06 NOTE — Consult Note (Signed)
Referring Provider: Dr. Olevia Bowens Primary Care Physician:  Wenda Low, MD Primary Gastroenterologist:  Althia Forts  Reason for Consultation:  GI bleed  HPI: Craig Freeman is a 89 y.o. male with metastatic prostate cancer to bones presents with acute onset of 3 episodes of dark red blood and clots with loose stools this morning. Has not had any recurrence of bleeding in ER. Denies abdominal pain/N/V/dizziness. He denies any previous episodes of bleeding. On Coumadin for history of DVT and INR 2.3. Daughter in room and reports last colonoscopy over 30 years ago (records not known). Hgb 11.3 (11.5 on 01/26/22 and 12.3 on 12/29/21). Denies NSAIDs. Feels ok.  Past Medical History:  Diagnosis Date   Cancer ([place])    Constipation    DVT (deep venous thrombosis), right    coumadin   High cholesterol    Hyperlipidemia    Hypertension     Past Surgical History:  Procedure Laterality Date   JOINT REPLACEMENT      Prior to Admission medications   Medication Sig Start Date End Date Taking? Authorizing Provider  albuterol (PROVENTIL HFA;VENTOLIN HFA) 108 (90 BASE) MCG/ACT inhaler Inhale 2 puffs into the lungs every 6 (six) hours as needed. Shortness of breath    [provider]  atenolol (TENORMIN) 50 MG tablet  02/17/18   [provider]  atenolol-chlorthalidone (TENORETIC) 50-25 MG per tablet Take 1 tablet by mouth daily. 06/16/11   Wenda Low, MD  atorvastatin (LIPITOR) 40 MG tablet Take 40 mg by mouth daily.    [provider]  chlorthalidone (HYGROTON) 25 MG tablet Take 25 mg by mouth daily.    [provider]  Cholecalciferol (VITAMIN D PO) Take 1 tablet by mouth daily.    [provider]  [employer] MELATONIN 5 MG TABS 1 TABLET AT BEDTIME AS NEEDED WITH FOOD ONCE A DAY AT BEDTIME ORALLY 30 09/15/17   [provider]  DENTA 5000 PLUS 1.1 % CREA dental cream  02/20/18   [provider]  Famotidine (PEPCID PO) Take by mouth.    [provider]  famotidine (PEPCID) 20 MG tablet Take 20 mg by mouth 2 (two) times daily. 12/20/17   [provider]  ferrous sulfate 325 (65 FE) MG tablet Take 325 mg by mouth daily with breakfast.    [provider]  finasteride (PROSCAR) 5 MG tablet Take 5 mg by mouth daily.    [provider]  fluticasone (FLONASE) 50 MCG/ACT nasal spray INHALE 2 PUFFS EACH NOSTRIL EVERY DAY 30 12/20/17   [provider]  Fluticasone-Salmeterol (ADVAIR) 100-50 MCG/DOSE AEPB Inhale 1 puff into the lungs every 12 (twelve) hours. Shortness of breath    [provider]  KLOR-CON M10 10 MEQ tablet Take 20 mEq by mouth daily. 02/27/18   [provider]  levocetirizine (XYZAL) 5 MG tablet every evening. 10/05/17   [provider]  potassium chloride (K-DUR) 10 MEQ tablet Take 2 tablets (20 mEq total) by mouth 2 (two) times daily. 06/16/11   Wenda Low, MD  triamcinolone cream (KENALOG) 0.1 % Apply 1 Application topically 2 (two) times daily. 09/09/21   Teodora Medici, FNP  warfarin (COUMADIN) 5 MG tablet Take 5 mg by mouth daily.    [provider]    Scheduled Meds: Continuous Infusions: PRN Meds:.acetaminophen **OR** acetaminophen, ondansetron **OR** ondansetron (ZOFRAN) IV  Allergies as of 02/06/2022 - Review Complete 02/06/2022  Allergen Reaction Noted   Amoxicillin  02/06/2022   Augmentin [amoxicillin-pot clavulanate]  02/06/2022   Zithromax [azithromycin]  02/06/2022    History reviewed. No pertinent family history.  Social History   Socioeconomic History   Marital status: Married    Spouse name: Not on file   Number of children: Not on file   Years of education: Not on file   Highest education level: Not on file  Occupational History   Not on file  Tobacco Use   Smoking status: Never   Smokeless tobacco: Never  Substance and Sexual Activity   Alcohol use: No   Drug use: No   Sexual activity: Not Currently  Other  Topics Concern   Not on file  Social History Narrative   Not on file   Social Determinants of Health   Financial Resource Strain: Not on file  Food Insecurity: Not on file  Transportation Needs: Not on file  Physical Activity: Not on file  Stress: Not on file  Social Connections: Not on file  Intimate Partner Violence: Not on file    Review of Systems: All negative except as stated above in HPI.  Physical Exam: Vital signs: Vitals:   02/06/22 1230 02/06/22 1254  BP: 119/60 119/60  Pulse: (!) 58 (!) 59  Resp: 18 18  Temp:  97.8 F (36.6 C)  SpO2: 100% 100%     General:  Lethargic, elderly, Well-developed, well-nourished, pleasant and cooperative in NAD Head: normocephalic, atraumatic Eyes: anicteric sclera ENT: oropharynx clear Neck: supple, nontender Lungs:  Clear throughout to auscultation.   No wheezes, crackles, or rhonchi. No acute distress. Heart:  Regular rate and rhythm; no murmurs, clicks, rubs,  or gallops. Abdomen: soft, nontender, nondistended, +BS  Rectal:  Deferred Ext: no edema  GI:  Lab Results: Recent Labs    02/06/22 1000  WBC 8.2  HGB 11.3*  HCT 36.1*  PLT 162   BMET Recent Labs    02/06/22 1000  NA 139  K 4.2  CL 107  CO2 24  GLUCOSE 97  BUN 20  CREATININE 1.22  CALCIUM 9.0   LFT Recent Labs    02/06/22 1000  PROT 6.3*  ALBUMIN 3.7  AST 32  ALT 24  ALKPHOS 31*  BILITOT 0.8   PT/INR Recent Labs    02/06/22 1000  LABPROT 25.3*  INR 2.3*     Studies/Results: No results found.  Impression/Plan: Painless hematochezia in the setting of chronic anticoagulation with Coumadin (INR 2.3). Hgb close to baseline. Would manage conservatively. Hold Coumadin. If bleeding recurs and persists or hemodynamics worsen, then do CT angiogram. No plan for colonoscopy and would manage conservatively. Clear liquid diet ok.    LOS: 0 days   [name]  02/06/2022, 2:11 PM  Questions please call [phone]

## 2022-02-06 NOTE — ED Provider Notes (Signed)
[facility] DEPT Provider Note   CSN: [id number] Arrival date & time: 02/06/22  7564     History  Chief Complaint  Patient presents with   Rectal Bleeding    Craig Freeman is a 89 y.o. male with PMH significant for HTN, HLD, prostate cancer with bony mets, and history of DVT on Coumadin presenting with rectal bleeding.  Patient had 3 episodes of bright red blood mixed with loose stool this morning. Endorses associated dizziness after these episodes but no dizziness at the present time. Denies abdominal pain, N/V, syncope, palpitations, shortness of breath or other complaints. No history of similar episodes in the past. Reportedly had his INR checked on Wednesday although I am unable to see these records. Daughter reports there was a significant amount of blood and that it was dark red in color.    Home Medications Prior to Admission medications   Medication Sig Start Date End Date Taking? Authorizing Provider  albuterol (PROVENTIL HFA;VENTOLIN HFA) 108 (90 BASE) MCG/ACT inhaler Inhale 2 puffs into the lungs every 6 (six) hours as needed. Shortness of breath    [provider]  atenolol (TENORMIN) 50 MG tablet  02/17/18   [provider]  atenolol-chlorthalidone (TENORETIC) 50-25 MG per tablet Take 1 tablet by mouth daily. 06/16/11   Wenda Low, MD  atorvastatin (LIPITOR) 40 MG tablet Take 40 mg by mouth daily.    [provider]  chlorthalidone (HYGROTON) 25 MG tablet Take 25 mg by mouth daily.    [provider]  Cholecalciferol (VITAMIN D PO) Take 1 tablet by mouth daily.    [provider]  [employer]S MELATONIN 5 MG TABS 1 TABLET AT BEDTIME AS NEEDED WITH FOOD ONCE A DAY AT BEDTIME ORALLY 30 09/15/17   [provider]  DENTA 5000 PLUS 1.1 % CREA dental cream  02/20/18   [provider]  Famotidine (PEPCID PO) Take by mouth.    [provider]  famotidine (PEPCID) 20 MG tablet Take 20 mg  by mouth 2 (two) times daily. 12/20/17   [provider]  ferrous sulfate 325 (65 FE) MG tablet Take 325 mg by mouth daily with breakfast.    [provider]  finasteride (PROSCAR) 5 MG tablet Take 5 mg by mouth daily.    [provider]  fluticasone (FLONASE) 50 MCG/ACT nasal spray INHALE 2 PUFFS EACH NOSTRIL EVERY DAY 30 12/20/17   [provider]  Fluticasone-Salmeterol (ADVAIR) 100-50 MCG/DOSE AEPB Inhale 1 puff into the lungs every 12 (twelve) hours. Shortness of breath    [provider]  KLOR-CON M10 10 MEQ tablet Take 20 mEq by mouth daily. 02/27/18   [provider]  levocetirizine (XYZAL) 5 MG tablet every evening. 10/05/17   [provider]  potassium chloride (K-DUR) 10 MEQ tablet Take 2 tablets (20 mEq total) by mouth 2 (two) times daily. 06/16/11   Wenda Low, MD  triamcinolone cream (KENALOG) 0.1 % Apply 1 Application topically 2 (two) times daily. 09/09/21   Teodora Medici, FNP  warfarin (COUMADIN) 5 MG tablet Take 5 mg by mouth daily.    [provider]      Allergies    Amoxicillin, Augmentin [amoxicillin-pot clavulanate], and Zithromax [azithromycin]    Review of Systems   Review of Systems  Respiratory:  Negative for shortness of breath.   Cardiovascular:  Negative for chest pain and palpitations.  Gastrointestinal:  Positive for blood in stool. Negative for abdominal pain, nausea  and vomiting.  Neurological:  Positive for dizziness. Negative for syncope and headaches.    Physical Exam Updated Vital Signs BP 119/60 (BP Location: Left Arm)   Pulse (!) 59   Temp 97.8 F (36.6 C) (Oral)   Resp 18   Ht '5\' 4"'$  (1.626 m)   Wt 65 kg   SpO2 100%   BMI 24.60 kg/m  Physical Exam Constitutional:      General: He is not in acute distress.    Appearance: Normal appearance.  HENT:     Head: Normocephalic and atraumatic.     Mouth/Throat:     Mouth: Mucous membranes are moist.  Eyes:      Conjunctiva/sclera: Conjunctivae normal.  Cardiovascular:     Rate and Rhythm: Normal rate and regular rhythm.     Heart sounds: Normal heart sounds.  Pulmonary:     Effort: Pulmonary effort is normal.     Breath sounds: Normal breath sounds.  Abdominal:     General: Bowel sounds are normal. There is no distension.     Palpations: Abdomen is soft.     Tenderness: There is no abdominal tenderness.  Genitourinary:    Comments: Dark red clotted blood seen on rectal exam, 1 small nonbleeding external hemorrhoid noted. Musculoskeletal:     Cervical back: Neck supple.  Skin:    General: Skin is warm and dry.     Capillary Refill: Capillary refill takes less than 2 seconds.  Neurological:     General: No focal deficit present.     Mental Status: He is alert. Mental status is at baseline.     ED Results / Procedures / Treatments   Labs (all labs ordered are listed, but only abnormal results are displayed) Labs Reviewed  COMPREHENSIVE METABOLIC PANEL - Abnormal; Notable for the following components:      Result Value   Total Protein 6.3 (*)    Alkaline Phosphatase 31 (*)    GFR, Estimated 55 (*)    All other components within normal limits  CBC WITH DIFFERENTIAL/PLATELET - Abnormal; Notable for the following components:   RBC 3.99 (*)    Hemoglobin 11.3 (*)    HCT 36.1 (*)    All other components within normal limits  PROTIME-INR - Abnormal; Notable for the following components:   Prothrombin Time 25.3 (*)    INR 2.3 (*)    All other components within normal limits  TYPE AND SCREEN    EKG None  Radiology No results found.  Procedures Procedures    Medications Ordered in ED Medications - No data to display  ED Course/ Medical Decision Making/ A&P                             Medical Decision Making Amount and/or Complexity of Data Reviewed Labs: ordered.   This is a 89 year old male with PMH significant for HTN, HLD, prostate cancer with mets to bone, and  prior DVT on Coumadin presenting with several episodes of rectal bleeding since this morning. He reports 3 episodes of dark red bloody bowel movements and associated dizziness.  Differential includes diverticular bleed vs hemorrhoids vs AVM. Vital signs stable on arrival, patient overall well-appearing with benign abdominal exam. CBC, CMP, PT/INR, and type & screen ordered.  Reassuringly, initial hemoglobin 11.3 which is not significantly lower than his baseline. INR therapeutic at 2.3.    Discussed case with GI, Dr Michail Sermon, who will plan to see patient.  No additional imaging indicated at this time. If patient continues to bleed while in the ED he recommends abdominal CTA. Will hold off on Warfarin reversal at this time.  Upon reassessment, patient remains hemodynamically stable with normal vitals and no further episodes of bleeding noted. Feel patient would benefit from admission for serial hemoglobin monitoring. Spoke with hospitalist, Dr. Olevia Bowens, who accepts patient for admission. Patient and son updated at bedside and are in agreement with plan.  Final Clinical Impression(s) / ED Diagnoses Final diagnoses:  Rectal bleeding    Rx / DC Orders ED Discharge Orders     None         Alcus Dad, MD 02/06/22 1324    Blanchie Dessert, MD 02/07/22 [phone]

## 2022-02-06 NOTE — ED Triage Notes (Signed)
Patient BIB [facility] from home. Started having dark red blood out of his rectum at 6am. Takes warfarin. History of prostate cancer.

## 2022-02-06 NOTE — H&P (Signed)
History and Physical    Patient: Craig Freeman DOB: 1924/06/11 DOA: 02/06/2022 DOS: the patient was seen and examined on 02/06/2022 PCP: Wenda Low, MD  Patient coming from: Home  Chief Complaint:  Chief Complaint  Patient presents with   Rectal Bleeding   HPI: Craig Freeman is a 89 y.o. 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  No previous hematochezia.  No history of hemorrhoids.  No complaints of abdominal pain, nausea, emesis or constipation.  He was mildly lightheaded afterwards, but no chest pain, palpitations, diaphoresis, PND, orthopnea or recent history of pitting edema of the lower extremities. He denied fever, chills, rhinorrhea, sore throat, wheezing or hemoptysis. No flank pain, dysuria, frequency or hematuria.  No polyuria, polydipsia, polyphagia or blurred vision.   ED course: Initial vital signs were temperature 97.5 F, pulse 53, respiration 19, BP 133/61 mmHg O2 sat 99% on room air.  Lab work: His CBC showed a Rideout count of 8.2, hemoglobin 11.3 g/dL platelets 162.  Most recent H&H was 10.5/33.3.  PT 25.3 seconds and INR 2.3.  CMP showed a total protein of 6.3 g/dL and alkaline phosphatase of 31, the rest of the CMP measurements were normal.   Review of Systems: As mentioned in the history of present illness. All other systems reviewed and are negative.  Past Medical History:  Diagnosis Date   Cancer ([place])    Constipation    DVT (deep venous thrombosis), right    coumadin   High cholesterol    Hyperlipidemia    Hypertension    Past Surgical History:  Procedure Laterality Date   JOINT REPLACEMENT     Social History:  reports that he has never smoked. He has never used smokeless tobacco. He reports that he does not drink alcohol and  does not use drugs.  Allergies  Allergen Reactions   Amoxicillin    Augmentin [Amoxicillin-Pot Clavulanate]    Zithromax [Azithromycin]     History reviewed. No pertinent family history.  Prior to Admission medications   Medication Sig Start Date End Date Taking? Authorizing Provider  albuterol (PROVENTIL HFA;VENTOLIN HFA) 108 (90 BASE) MCG/ACT inhaler Inhale 2 puffs into the lungs every 6 (six) hours as needed. Shortness of breath    [provider]  atenolol (TENORMIN) 50 MG tablet  02/17/18   [provider]  atenolol-chlorthalidone (TENORETIC) 50-25 MG per tablet Take 1 tablet by mouth daily. 06/16/11   Wenda Low, MD  atorvastatin (LIPITOR) 40 MG tablet Take 40 mg by mouth daily.    [provider]  chlorthalidone (HYGROTON) 25 MG tablet Take 25 mg by mouth daily.    [provider]  Cholecalciferol (VITAMIN D PO) Take 1 tablet by mouth daily.    [provider]  [employer] MELATONIN 5 MG TABS 1 TABLET AT BEDTIME AS NEEDED WITH FOOD ONCE A DAY AT BEDTIME ORALLY 30 09/15/17   [provider]  DENTA 5000 PLUS 1.1 % CREA dental cream  02/20/18   [provider]  Famotidine (PEPCID PO) Take by mouth.    [provider]  famotidine (PEPCID) 20 MG tablet Take 20 mg by mouth 2 (two) times daily. 12/20/17   [provider]  ferrous sulfate 325 (65 FE) MG tablet Take 325 mg by mouth daily with breakfast.  [provider]  finasteride (PROSCAR) 5 MG tablet Take 5 mg by mouth daily.    [provider]  fluticasone (FLONASE) 50 MCG/ACT nasal spray INHALE 2 PUFFS EACH NOSTRIL EVERY DAY 30 12/20/17   [provider]  Fluticasone-Salmeterol (ADVAIR) 100-50 MCG/DOSE AEPB Inhale 1 puff into the lungs every 12 (twelve) hours. Shortness of breath    [provider]  KLOR-CON M10 10 MEQ tablet Take 20 mEq by mouth daily. 02/27/18   [provider]  levocetirizine (XYZAL) 5 MG tablet  every evening. 10/05/17   [provider]  potassium chloride (K-DUR) 10 MEQ tablet Take 2 tablets (20 mEq total) by mouth 2 (two) times daily. 06/16/11   Wenda Low, MD  triamcinolone cream (KENALOG) 0.1 % Apply 1 Application topically 2 (two) times daily. 09/09/21   Teodora Medici, FNP  warfarin (COUMADIN) 5 MG tablet Take 5 mg by mouth daily.    [provider]    Physical Exam: Vitals:   02/06/22 0936 02/06/22 1124 02/06/22 1230 02/06/22 1254  BP: 133/61 119/60 119/60 119/60  Pulse: (!) 53 (!) 55 (!) 58 (!) 59  Resp: '19 16 18 18  '$ Temp: (!) 97.5 F (36.4 C)   97.8 F (36.6 C)  TempSrc: Axillary   Oral  SpO2: 99% 100% 100% 100%  Weight:      Height:       Physical Exam Vitals and nursing note reviewed.  Constitutional:      General: He is awake. He is not in acute distress.    Appearance: Normal appearance.  HENT:     Head: Normocephalic and atraumatic.     Nose: No rhinorrhea.     Mouth/Throat:     Mouth: Mucous membranes are moist.  Eyes:     General: No scleral icterus.    Pupils: Pupils are equal, round, and reactive to light.  Neck:     Vascular: No JVD.  Cardiovascular:     Rate and Rhythm: Normal rate and regular rhythm.     Heart sounds: S1 normal and S2 normal.  Pulmonary:     Effort: Pulmonary effort is normal.     Breath sounds: Normal breath sounds.  Abdominal:     General: Bowel sounds are normal. There is no distension.     Palpations: Abdomen is soft.     Tenderness: There is no abdominal tenderness. There is no guarding.  Musculoskeletal:     Cervical back: Neck supple.     Right lower leg: No edema.     Left lower leg: No edema.  Skin:    General: Skin is warm and dry.     Comments: Numerous hyperpigmented macules and patches.  Neurological:     General: No focal deficit present.     Mental Status: He is alert and oriented to person, place, and time.  Psychiatric:        Mood and Affect: Mood normal.        Behavior:  Behavior normal. Behavior is cooperative.   Data Reviewed:  Results are pending, will review when available.  Assessment and Plan: Principal Problem:   Rectal bleeding Admit to PCU/inpatient. Clear liquid diet. Continue IV fluids. Monitor H&H. Transfuse as needed. GI consult greatly appreciated. CTA GI bleeding if it recurs.  Active Problems:   DEEP VENOUS THROMBOPHLEBITIS, HX OF Hold warfarin for now.    Hypertension Hold antihypertensives for now. Resume in a.m. if bleeding does not recur.    Hyperlipidemia  Continue atorvastatin 40 mg p.o. daily.    Prostate cancer metastatic to bone (HCC) Continue Nubeqa 600 mg p.o. twice daily Follow-up with urology as scheduled.      Advance Care Planning:   Code Status: Prior   Consults: [facility]  Family Communication:   Severity of Illness: The appropriate patient status for this patient is OBSERVATION. Observation status is judged to be reasonable and necessary in order to provide the required intensity of service to ensure the patient's safety. The patient's presenting symptoms, physical exam findings, and initial radiographic and laboratory data in the context of their medical condition is felt to place them at decreased risk for further clinical deterioration. Furthermore, it is anticipated that the patient will be medically stable for discharge from the hospital within 2 midnights of admission.   Author: Reubin Milan, MD 02/06/2022 1:11 PM  For on call review [email or web address].   This document was prepared using Dragon voice recognition software and may contain some unintended transcription errors.

## 2022-02-06 NOTE — ED Notes (Signed)
Daughter called and updated on plan of care.  Patient currently resting quietly

## 2022-02-07 ENCOUNTER — Observation Stay (HOSPITAL_COMMUNITY): Payer: Medicare Other

## 2022-02-07 DIAGNOSIS — K5730 Diverticulosis of large intestine without perforation or abscess without bleeding: Secondary | ICD-10-CM | POA: Diagnosis not present

## 2022-02-07 DIAGNOSIS — C7951 Secondary malignant neoplasm of bone: Secondary | ICD-10-CM | POA: Diagnosis present

## 2022-02-07 DIAGNOSIS — I10 Essential (primary) hypertension: Secondary | ICD-10-CM

## 2022-02-07 DIAGNOSIS — I700 Atherosclerosis of aorta: Secondary | ICD-10-CM | POA: Diagnosis present

## 2022-02-07 DIAGNOSIS — C61 Malignant neoplasm of prostate: Secondary | ICD-10-CM

## 2022-02-07 DIAGNOSIS — K5731 Diverticulosis of large intestine without perforation or abscess with bleeding: Secondary | ICD-10-CM | POA: Diagnosis present

## 2022-02-07 DIAGNOSIS — Z8672 Personal history of thrombophlebitis: Secondary | ICD-10-CM | POA: Diagnosis not present

## 2022-02-07 DIAGNOSIS — Z79899 Other long term (current) drug therapy: Secondary | ICD-10-CM | POA: Diagnosis not present

## 2022-02-07 DIAGNOSIS — Z86718 Personal history of other venous thrombosis and embolism: Secondary | ICD-10-CM | POA: Diagnosis not present

## 2022-02-07 DIAGNOSIS — E7849 Other hyperlipidemia: Secondary | ICD-10-CM

## 2022-02-07 DIAGNOSIS — Z8546 Personal history of malignant neoplasm of prostate: Secondary | ICD-10-CM | POA: Diagnosis not present

## 2022-02-07 DIAGNOSIS — C7989 Secondary malignant neoplasm of other specified sites: Secondary | ICD-10-CM | POA: Diagnosis not present

## 2022-02-07 DIAGNOSIS — K644 Residual hemorrhoidal skin tags: Secondary | ICD-10-CM | POA: Diagnosis present

## 2022-02-07 DIAGNOSIS — E7800 Pure hypercholesterolemia, unspecified: Secondary | ICD-10-CM | POA: Diagnosis present

## 2022-02-07 DIAGNOSIS — Z7951 Long term (current) use of inhaled steroids: Secondary | ICD-10-CM | POA: Diagnosis not present

## 2022-02-07 DIAGNOSIS — K625 Hemorrhage of anus and rectum: Secondary | ICD-10-CM | POA: Diagnosis present

## 2022-02-07 DIAGNOSIS — C801 Malignant (primary) neoplasm, unspecified: Secondary | ICD-10-CM | POA: Diagnosis not present

## 2022-02-07 DIAGNOSIS — Z7901 Long term (current) use of anticoagulants: Secondary | ICD-10-CM | POA: Diagnosis not present

## 2022-02-07 DIAGNOSIS — D630 Anemia in neoplastic disease: Secondary | ICD-10-CM | POA: Diagnosis present

## 2022-02-07 DIAGNOSIS — I358 Other nonrheumatic aortic valve disorders: Secondary | ICD-10-CM | POA: Diagnosis present

## 2022-02-07 DIAGNOSIS — K922 Gastrointestinal hemorrhage, unspecified: Secondary | ICD-10-CM | POA: Diagnosis not present

## 2022-02-07 DIAGNOSIS — I2510 Atherosclerotic heart disease of native coronary artery without angina pectoris: Secondary | ICD-10-CM | POA: Diagnosis present

## 2022-02-07 DIAGNOSIS — Z881 Allergy status to other antibiotic agents status: Secondary | ICD-10-CM | POA: Diagnosis not present

## 2022-02-07 LAB — CBC
HCT: 34.7 % — ABNORMAL LOW (ref 39.0–52.0)
Hemoglobin: 10.4 g/dL — ABNORMAL LOW (ref 13.0–17.0)
MCH: 28.5 pg (ref 26.0–34.0)
MCHC: 30 g/dL (ref 30.0–36.0)
MCV: 95.1 fL (ref 80.0–100.0)
Platelets: 133 10*3/uL — ABNORMAL LOW (ref 150–400)
RBC: 3.65 MIL/uL — ABNORMAL LOW (ref 4.22–5.81)
RDW: 13.2 % (ref 11.5–15.5)
WBC: 6.9 10*3/uL (ref 4.0–10.5)
nRBC: 0 % (ref 0.0–0.2)

## 2022-02-07 LAB — COMPREHENSIVE METABOLIC PANEL
ALT: 23 U/L (ref 0–44)
AST: 31 U/L (ref 15–41)
Albumin: 3.6 g/dL (ref 3.5–5.0)
Alkaline Phosphatase: 31 U/L — ABNORMAL LOW (ref 38–126)
Anion gap: 8 (ref 5–15)
BUN: 16 mg/dL (ref 8–23)
CO2: 20 mmol/L — ABNORMAL LOW (ref 22–32)
Calcium: 8.4 mg/dL — ABNORMAL LOW (ref 8.9–10.3)
Chloride: 107 mmol/L (ref 98–111)
Creatinine, Ser: 0.97 mg/dL (ref 0.61–1.24)
GFR, Estimated: >60 mL/min (ref >60)
Glucose, Bld: 76 mg/dL (ref 70–99)
Potassium: 3.9 mmol/L (ref 3.5–5.1)
Sodium: 135 mmol/L (ref 135–145)
Total Bilirubin: 0.9 mg/dL (ref 0.3–1.2)
Total Protein: 6.3 g/dL — ABNORMAL LOW (ref 6.5–8.1)

## 2022-02-07 LAB — PROTIME-INR
INR: 2.1 — ABNORMAL HIGH (ref 0.8–1.2)
Prothrombin Time: 23.2 seconds — ABNORMAL HIGH (ref 11.4–15.2)

## 2022-02-07 MED ORDER — IOHEXOL 300 MG/ML  SOLN
100.0000 mL | Freq: Once | INTRAMUSCULAR | Status: AC | PRN
  Administered 2022-02-07: 100 mL via INTRAVENOUS

## 2022-02-07 NOTE — ED Notes (Signed)
Patient ambulated to restroom.  Small BM with blood noted.  Patient noted to have steady gait.  No complaints of dizziness or abdominal pain.

## 2022-02-07 NOTE — Progress Notes (Signed)
[name], is a 89 y.o. male, DOB - 16-Feb-1924, EVO:[id number] Admit date - 02/06/2022    Outpatient Primary MD for the patient is Wenda Low, MD  LOS - 0  days  Chief Complaint  Patient presents with   Rectal Bleeding       Brief summary   Patient is a 89 year old male with prostate cancer metastatic to bone, HTN, DVT on warfarin presented to ED with 3 loose stools associated with rectal bleeding on the morning of admission.  No prior history of GI bleed.  No nausea or vomiting, abdominal pain or any prior constipation.  He was mildly dizzy afterwards but no chest pain or palpitations or diaphoresis. In ED, temp 97.5, pulse 53, respiration 19, BP 133/61, O2 sats 99% on room air.  CBC showed WBCs 8.2, hemoglobin 11.3, INR 2.3. GI was consulted and patient was admitted for further management   Assessment & Plan    Principal Problem: Acute lower GI bleed/painless hematochezia, chronic anemia -Likely diverticular bleeding -Presented with 3 episodes of rectal bleeding prior to admission.  Hemoglobin 11.3 on admission.  Hemoglobin was 11.5 on 01/26/2022 -Hemoglobin 10.4 this morning, per patient, no BM since admission. -Continue clear liquid diet, IVF, serial H&H, transfuse for hemoglobin less than 8 -GI consulted, recommended CT abdomen pelvis with contrast to evaluate for any colonic malignancy.  -Continue supportive care, hopefully DC home in a.m. if H&H remained stable and no further bleeding. -Continue to hold warfarin, will await GI recs regarding when to resume on dc -CT abdomen showed colonic diverticulosis without acute diverticulitis.  Widespread metastatic disease to the bone, aortic atherosclerosis as well as LAD CAD, calcification of aortic valve  Active Problems:   DEEP VENOUS THROMBOPHLEBITIS, HX OF -Continue to hold warfarin    Hypertension -BP  currently stable, hold losartan, Tenormin    Prostate cancer metastatic to bone (HCC) -On Nubeqa 600 mg p.o. twice daily, follow with urology as scheduled outpatient    Hyperlipidemia Continue atorvastatin 40 mg daily    Code Status: Full code DVT Prophylaxis:  SCDs Start: 02/06/22 1410   Level of Care: Level of care: Progressive Family Communication:  Disposition Plan:      Remains inpatient appropriate:     Procedures:  None  Consultants:   Gastroenterology  Antimicrobials: None   Medications  atorvastatin  40 mg Oral Daily   calcium-vitamin D  1 tablet Oral BID WC   ferrous sulfate  325 mg Oral BID WC   fluticasone  2 spray Each Nare Daily   mometasone-formoterol  2 puff Inhalation BID   polyvinyl alcohol  1 drop Both Eyes QAC breakfast   traZODone  25 mg Oral QHS      Subjective:   Craig Freeman was seen and examined today.  Per patient, no BM since admission.  Currently denies any dizziness, chest pain, shortness of breath, abdominal pain nausea vomiting.  H&H stable.  Objective:   Vitals:   02/07/22 0500 02/07/22 0612 02/07/22  0827 02/07/22 1006  BP: (!) 141/72  (!) 146/64   Pulse: 61  60   Resp: 18  16   Temp:  97.8 F (36.6 C)  97.6 F (36.4 C)  TempSrc:  Oral  Oral  SpO2: 100%  100%   Weight:      Height:       No intake or output data in the 24 hours ending 02/07/22 1116   Wt Readings from Last 3 Encounters:  02/06/22 65 kg  12/29/21 64.6 kg  09/09/21 64.4 kg     Exam General: Alert and oriented x 3, NAD Cardiovascular: S1 S2 auscultated,  RRR Respiratory: Clear to auscultation bilaterally Gastrointestinal: Soft, nontender, nondistended, + bowel sounds Ext: no pedal edema bilaterally Neuro: Strength 5/5 upper and lower extremities bilaterally Psych: Normal affect     Data Reviewed:  I have personally reviewed following labs    CBC Lab Results  Component Value Date   WBC 6.9 02/07/2022   RBC 3.65 (L) 02/07/2022   HGB  10.4 (L) 02/07/2022   HCT 34.7 (L) 02/07/2022   MCV 95.1 02/07/2022   MCH 28.5 02/07/2022   PLT 133 (L) 02/07/2022   MCHC 30.0 02/07/2022   RDW 13.2 02/07/2022   LYMPHSABS 1.7 02/06/2022   MONOABS 0.6 02/06/2022   EOSABS 0.1 02/06/2022   BASOSABS 0.0 95/62/1308     Last metabolic panel Lab Results  Component Value Date   NA 135 02/07/2022   K 3.9 02/07/2022   CL 107 02/07/2022   CO2 20 (L) 02/07/2022   BUN 16 02/07/2022   CREATININE 0.97 02/07/2022   GLUCOSE 76 02/07/2022   GFRNONAA >60 02/07/2022   GFRAA 71 (L) 06/16/2011   CALCIUM 8.4 (L) 02/07/2022   PROT 6.3 (L) 02/07/2022   ALBUMIN 3.6 02/07/2022   BILITOT 0.9 02/07/2022   ALKPHOS 31 (L) 02/07/2022   AST 31 02/07/2022   ALT 23 02/07/2022   ANIONGAP 8 02/07/2022    CBG (last 3)  No results for input(s): "GLUCAP" in the last 72 hours.    Coagulation Profile: Recent Labs  Lab 02/06/22 1000 02/07/22 0737  INR 2.3* 2.1*     Radiology Studies: I have personally reviewed the imaging studies  CT ABDOMEN PELVIS W WO CONTRAST  Result Date: 02/07/2022 CLINICAL DATA:  89 year old male with history of rectal bleeding. History of prostate cancer with metastatic disease to the bones. * Tracking Code: BO * EXAM: CT ABDOMEN AND PELVIS WITHOUT AND WITH CONTRAST TECHNIQUE: Multidetector CT imaging of the abdomen and pelvis was performed following the standard protocol before and following the bolus administration of intravenous contrast. RADIATION DOSE REDUCTION: This exam was performed according to the departmental dose-optimization program which includes automated exposure control, adjustment of the mA and/or kV according to patient size and/or use of iterative reconstruction technique. CONTRAST:  181m OMNIPAQUE IOHEXOL 300 MG/ML  SOLN COMPARISON:  CT of the abdomen and pelvis 10/14/2020. FINDINGS: Lower chest: Fibrotic changes in the lung bases bilaterally suggesting probable usual interstitial pneumonia. Atherosclerotic  calcifications in the thoracic aorta as well as the left anterior descending coronary artery. Thickening and calcification of the aortic valve. Hepatobiliary: No definite suspicious cystic or solid hepatic lesions. No intra or extrahepatic biliary ductal dilatation. Gallbladder is unremarkable in appearance. Pancreas: No pancreatic mass. No pancreatic ductal dilatation. No pancreatic or peripancreatic fluid collections or inflammatory changes. Spleen: Unremarkable. Adrenals/Urinary Tract: 1.3 cm low-attenuation nonenhancing lesion in the posterior aspect of the interpolar region of the left kidney,  compatible with a simple cyst. Numerous other subcentimeter low-attenuation lesions in both kidneys, too small to definitively characterize, but statistically likely to represent tiny cysts. Bilateral adrenal glands are normal in appearance. No hydroureteronephrosis. Urinary bladder is largely obscured by beam hardening artifact from the patient's left hip arthroplasty. Visualized portions of the urinary bladder demonstrate mural thickening in some multifocal punctate mural calcification, without discrete bladder wall mass. Stomach/Bowel: The appearance of the stomach is normal. There is no pathologic dilatation of small bowel or colon. Numerous colonic diverticula are noted, without surrounding inflammatory changes to suggest an acute diverticulitis at this time. Normal appendix. Vascular/Lymphatic: Aortic atherosclerosis, without evidence of aneurysm or dissection in the abdominal or pelvic vasculature. No lymphadenopathy noted in the abdomen or pelvis. Reproductive: Prostate gland and seminal vesicles are largely obscured by beam hardening artifact from the patient's left hip arthroplasty. Other: No significant volume of ascites.  No pneumoperitoneum. Musculoskeletal: Numerous sclerotic lesions are again noted throughout the visualized axial and appendicular skeleton, compatible with widespread metastatic disease to  the bones. The extent of disease is very similar to recent PET-CT, but progressive compared to prior CT examination 10/14/2020. The best example of this is in the left ilium adjacent to the anterior aspect of the sacroiliac joint, where a sclerotic lesion has enlarged measuring 2.9 x 2.2 cm on today's study (previously 1.7 x 2.3 cm on 10/14/2020). IMPRESSION: 1. Colonic diverticulosis without evidence of acute diverticulitis at this time. 2. Widespread metastatic disease to the bones in this patient with history of prostate cancer, which is stable compared to the recent PET-CT, but progressive compared to more remote prior examination. 3. 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 Outpatient referral to Pulmonology for further clinical evaluation is recommended. 4. Aortic atherosclerosis, as well as the left anterior descending coronary artery disease. 5. There are calcifications of the aortic valve. Echocardiographic correlation for evaluation of potential valvular dysfunction may be warranted if clinically indicated. Electronically Signed   By: Vinnie Langton M.D.   On: 02/07/2022 10:13       Gearlene Godsil M.D. [facility] 02/07/2022, 11:16 AM  Available via Epic secure chat 7am-7pm After 7 pm, please refer to night coverage provider listed on amion.

## 2022-02-07 NOTE — Progress Notes (Signed)
[facility] Progress Note  SIVAN QUAST 89 y.o. 01-19-1925   Subjective: Small BM with blood this morning and denies any bleeding overnight. Feels ok. Denies abdominal pain. Daughter in room.  Objective: Vital signs: Vitals:   02/07/22 0827 02/07/22 1006  BP: (!) 146/64   Pulse: 60   Resp: 16   Temp:  97.6 F (36.4 C)  SpO2: 100%     Physical Exam: Gen: lethargic, well-nourished, pleasant, no acute distress  HEENT: anicteric sclera CV: RRR Chest: CTA B Abd: soft, nontender, nondistended, +BS Ext: no edema  Lab Results: Recent Labs    02/06/22 1000 02/07/22 0702  NA 139 135  K 4.2 3.9  CL 107 107  CO2 24 20*  GLUCOSE 97 76  BUN 20 16  CREATININE 1.22 0.97  CALCIUM 9.0 8.4*   Recent Labs    02/06/22 1000 02/07/22 0702  AST 32 31  ALT 24 23  ALKPHOS 31* 31*  BILITOT 0.8 0.9  PROT 6.3* 6.3*  ALBUMIN 3.7 3.6   Recent Labs    02/06/22 1000 02/06/22 1500 02/06/22 2144 02/07/22 0702  WBC 8.2  --   --  6.9  NEUTROABS 5.8  --   --   --   HGB 11.3*   < > 10.1* 10.4*  HCT 36.1*   < > 32.7* 34.7*  MCV 90.5  --   --  95.1  PLT 162  --   --  133*   < > = values in this interval not displayed.      Assessment/Plan: Painless hematochezia that is resolving. Hgb stable. Will do abd/pelvis CT with contrast to evaluate for colonic malignancy and if negative for an obstructive process then can advance diet. Not planning to do a colonoscopy unless colon mass seen. Supportive care. Hopefully home tomorrow if bleeding continues to resolve.   [name] 02/07/2022, 10:10 AM  Questions please call [phone] ID: JOHNNEY SCARLATA, male   DOB: February 09, 1924, 89 y.o.   MRN: [id number]

## 2022-02-08 ENCOUNTER — Telehealth: Payer: Self-pay | Admitting: Hematology and Oncology

## 2022-02-08 ENCOUNTER — Telehealth: Payer: Self-pay

## 2022-02-08 DIAGNOSIS — K625 Hemorrhage of anus and rectum: Secondary | ICD-10-CM | POA: Diagnosis not present

## 2022-02-08 DIAGNOSIS — Z8672 Personal history of thrombophlebitis: Secondary | ICD-10-CM | POA: Diagnosis not present

## 2022-02-08 DIAGNOSIS — E7849 Other hyperlipidemia: Secondary | ICD-10-CM | POA: Diagnosis not present

## 2022-02-08 LAB — CBC
HCT: 33.7 % — ABNORMAL LOW (ref 39.0–52.0)
HCT: 32.3 % — ABNORMAL LOW (ref 39.0–52.0)
Hemoglobin: 10.6 g/dL — ABNORMAL LOW (ref 13.0–17.0)
Hemoglobin: 10.1 g/dL — ABNORMAL LOW (ref 13.0–17.0)
MCH: 28.4 pg (ref 26.0–34.0)
MCH: 28.1 pg (ref 26.0–34.0)
MCHC: 31.5 g/dL (ref 30.0–36.0)
MCHC: 31.3 g/dL (ref 30.0–36.0)
MCV: 90.3 fL (ref 80.0–100.0)
MCV: 90 fL (ref 80.0–100.0)
Platelets: 156 10*3/uL (ref 150–400)
Platelets: 159 10*3/uL (ref 150–400)
RBC: 3.73 MIL/uL — ABNORMAL LOW (ref 4.22–5.81)
RBC: 3.59 MIL/uL — ABNORMAL LOW (ref 4.22–5.81)
RDW: 13.2 % (ref 11.5–15.5)
RDW: 13.2 % (ref 11.5–15.5)
WBC: 6.1 10*3/uL (ref 4.0–10.5)
WBC: 5.9 10*3/uL (ref 4.0–10.5)
nRBC: 0 % (ref 0.0–0.2)
nRBC: 0 % (ref 0.0–0.2)

## 2022-02-08 LAB — PROTIME-INR
INR: 2 — ABNORMAL HIGH (ref 0.8–1.2)
Prothrombin Time: 22.8 seconds — ABNORMAL HIGH (ref 11.4–15.2)

## 2022-02-08 LAB — BASIC METABOLIC PANEL
Anion gap: 9 (ref 5–15)
BUN: 12 mg/dL (ref 8–23)
CO2: 24 mmol/L (ref 22–32)
Calcium: 8.9 mg/dL (ref 8.9–10.3)
Chloride: 106 mmol/L (ref 98–111)
Creatinine, Ser: 1 mg/dL (ref 0.61–1.24)
GFR, Estimated: >60 mL/min (ref >60)
Glucose, Bld: 108 mg/dL — ABNORMAL HIGH (ref 70–99)
Potassium: 4 mmol/L (ref 3.5–5.1)
Sodium: 139 mmol/L (ref 135–145)

## 2022-02-08 MED ORDER — ATENOLOL 50 MG PO TABS
50.0000 mg | ORAL_TABLET | Freq: Every day | ORAL | Status: DC
  Administered 2022-02-08: 50 mg via ORAL
  Filled 2022-02-08: qty 1

## 2022-02-08 MED ORDER — ATENOLOL 50 MG PO TABS: 50.0000 mg | ORAL_TABLET | Freq: Every day | ORAL | Status: DC

## 2022-02-08 MED ORDER — ONDANSETRON HCL 4 MG PO TABS: 4.0000 mg | ORAL_TABLET | Freq: Four times a day (QID) | ORAL | 0 refills | Status: DC | PRN

## 2022-02-08 MED ORDER — POLYETHYLENE GLYCOL 3350 17 G PO PACK: 17.0000 g | PACK | Freq: Every day | ORAL | 0 refills | Status: DC | PRN

## 2022-02-08 MED ORDER — WARFARIN SODIUM 5 MG PO TABS: 2.5000 mg | ORAL_TABLET | ORAL | Status: DC

## 2022-02-08 MED ORDER — DAROLUTAMIDE 300 MG PO TABS: 600.0000 mg | ORAL_TABLET | Freq: Two times a day (BID) | ORAL | Status: DC

## 2022-02-08 NOTE — Telephone Encounter (Signed)
Returned daughter's call regarding Pts hospitalization. Daughter states that she might need to reschedule appt for 1/16 due to hospitalization but will call back by end to day. Call back number given.

## 2022-02-08 NOTE — Discharge Summary (Signed)
Physician Discharge Summary   Patient: Craig Freeman MRN: [id number] DOB: 04-Aug-1924  Admit date:     02/06/2022  Discharge date: 02/08/22  Discharge Physician: Estill Cotta, MD    PCP: Wenda Low, MD   Recommendations at discharge:   Please obtain CBC in 1 week to follow-up Avoid constipation  Discharge Diagnoses:    Rectal bleeding, painless hematochezia, likely diverticular bleeding   DEEP VENOUS THROMBOPHLEBITIS, HX OF   Hypertension   Prostate cancer metastatic to bone [facility])   Hyperlipidemia     Hospital Course: Patient is a 89 year old male with prostate cancer metastatic to bone, HTN, DVT on warfarin presented to ED with 3 loose stools associated with rectal bleeding on the morning of admission.  No prior history of GI bleed.  No nausea or vomiting, abdominal pain or any prior constipation.  He was mildly dizzy afterwards but no chest pain or palpitations or diaphoresis. In ED, temp 97.5, pulse 53, respiration 19, BP 133/61, O2 sats 99% on room air.   CBC showed WBCs 8.2, hemoglobin 11.3, INR 2.3. GI was consulted and patient was admitted for further management   Assessment and Plan:   Acute lower GI bleed/painless hematochezia, chronic anemia -Likely diverticular bleeding -Presented with 3 episodes of rectal bleeding prior to admission.  Hemoglobin 11.3 on admission.  Hemoglobin was 11.5 on 01/26/2022 -GI consulted, recommended CT abdomen pelvis with contrast to evaluate for any colonic malignancy.  -CT abdomen showed colonic diverticulosis without acute diverticulitis.  Widespread metastatic disease to the bone, aortic atherosclerosis as well as LAD CAD, calcification of aortic valve -No further bleeding since admission.  H&H remained stable, 10.1 at discharge.  No plans for colonoscopy or further intervention -Per GI, okay to discharge home.  Avoid constipation.      DEEP VENOUS THROMBOPHLEBITIS, HX OF -Patient can resume warfarin      Hypertension -Stable, continue Tenormin, losartan     Prostate cancer metastatic to bone (HCC) -On Nubeqa 600 mg p.o. twice daily, follow with urology as scheduled outpatient     Hyperlipidemia Continue atorvastatin 40 mg daily   Plan discussed with patient's daughter on the phone and answered all questions.     Pain control - [employer] Controlled Substance Reporting System database was reviewed. and patient was instructed, not to drive, operate heavy machinery, perform activities at heights, swimming or participation in water activities or provide baby-sitting services while on Pain, Sleep and Anxiety Medications; until their outpatient Physician has advised to do so again. Also recommended to not to take more than prescribed Pain, Sleep and Anxiety Medications.  Consultants: GI Procedures performed: None Disposition: Home Diet recommendation:   DISCHARGE MEDICATION: Allergies as of 02/08/2022       Reactions   Amoxicillin Diarrhea   Augmentin [amoxicillin-pot Clavulanate] Diarrhea   Zithromax [azithromycin] Other (See Comments)   "Blood in the stools"        Medication List     STOP taking these medications    atenolol-chlorthalidone 50-25 MG tablet Commonly known as: TENORETIC   potassium chloride 10 MEQ tablet Commonly known as: KLOR-CON   triamcinolone cream 0.1 % Commonly known as: KENALOG       TAKE these medications    albuterol 108 (90 Base) MCG/ACT inhaler Commonly known as: VENTOLIN HFA Inhale 1-2 puffs into the lungs every 6 (six) hours as needed for wheezing or shortness of breath. Shortness of breath   atenolol 50 MG tablet Commonly known as: TENORMIN Take 50 mg by mouth  daily.   atorvastatin 40 MG tablet Commonly known as: LIPITOR Take 40 mg by mouth daily.   CALCIUM 600+D3 PO Take 1 tablet by mouth 2 (two) times daily with a meal.   [employer] Melatonin 5 MG Tabs Generic drug: melatonin Take 5 mg by mouth at bedtime as needed (for  sleep).   ferrous sulfate 325 (65 FE) MG tablet Take 325 mg by mouth 2 (two) times daily with a meal.   fluticasone 50 MCG/ACT nasal spray Commonly known as: FLONASE Place 2 sprays into both nostrils daily.   levocetirizine 5 MG tablet Commonly known as: XYZAL Take 5 mg by mouth every evening.   losartan 50 MG tablet Commonly known as: COZAAR Take 50 mg by mouth daily.   megestrol 20 MG tablet Commonly known as: MEGACE Take 20 mg by mouth at bedtime as needed (for hot flashes).   Nubeqa 300 MG tablet Generic drug: darolutamide Take 600 mg by mouth 2 (two) times daily with a meal.   ondansetron 4 MG tablet Commonly known as: ZOFRAN Take 1 tablet (4 mg total) by mouth every 6 (six) hours as needed for nausea.   polyethylene glycol 17 g packet Commonly known as: MiraLax Take 17 g by mouth daily as needed for mild constipation or moderate constipation (also available OTC.).   Systane Complete PF 0.6 % Soln Generic drug: Propylene Glycol (PF) Place 1 drop into both eyes in the morning.   triazolam 0.125 MG tablet Commonly known as: HALCION Take 0.125 mg by mouth at bedtime as needed for sleep.   warfarin 5 MG tablet Commonly known as: COUMADIN Take 0.5-1 tablets (2.5-5 mg total) by mouth See admin instructions. Take 2.5 mg by mouth in the morning on Sun/Tues/Thurs and 5 mg on Mon/Wed/Fri/Sat Start taking on: February 09, 2022   Wixela Inhub 100-50 MCG/ACT Aepb Generic drug: fluticasone-salmeterol Inhale 1 puff into the lungs 2 (two) times daily.   Xgeva 120 MG/1.7ML Soln injection Generic drug: denosumab Inject 120 mg into the skin every 30 (thirty) days.        Follow-up Information     Wenda Low, MD. Schedule an appointment as soon as possible for a visit in 2 week(s).   Specialty: Internal Medicine Why: for hospital follow-up, obtain labs CBC Contact information: [address] [place] [address] [phone]          Wilford Corner, MD. Schedule an appointment as soon as possible for a visit in 3 week(s).   Specialty: Gastroenterology Why: for hospital follow-up Contact information: [address] [place] [address] [phone]                Discharge Exam: Danley Danker Weights   02/06/22 0933  Weight: 65 kg   S: No complaints, tolerating diet.  Hoping to go home today.  Cleared by GI  BP 129/68 (BP Location: Right Arm)   Pulse 65   Temp 97.8 F (36.6 C) (Oral)   Resp 16   Ht '5\' 4"'$  (1.626 m)   Wt 65 kg   SpO2 99%   BMI 24.60 kg/m   Physical Exam General: Alert and oriented x 3, NAD Cardiovascular: S1 S2 clear, RRR.  Respiratory: CTAB, no wheezing, rales or rhonchi Gastrointestinal: Soft, nontender, nondistended, NBS Ext: no pedal edema bilaterally Neuro: no new deficits Psych: Normal affect    Condition at discharge: fair  The results of significant diagnostics from this hospitalization (including imaging, microbiology, ancillary and laboratory) are  listed below for reference.   Imaging Studies: CT ABDOMEN PELVIS W WO CONTRAST  Result Date: 02/07/2022 CLINICAL DATA:  89 year old male with history of rectal bleeding. History of prostate cancer with metastatic disease to the bones. * Tracking Code: BO * EXAM: CT ABDOMEN AND PELVIS WITHOUT AND WITH CONTRAST TECHNIQUE: Multidetector CT imaging of the abdomen and pelvis was performed following the standard protocol before and following the bolus administration of intravenous contrast. RADIATION DOSE REDUCTION: This exam was performed according to the departmental dose-optimization program which includes automated exposure control, adjustment of the mA and/or kV according to patient size and/or use of iterative reconstruction technique. CONTRAST:  157m OMNIPAQUE IOHEXOL 300 MG/ML  SOLN COMPARISON:  CT of the abdomen and pelvis 10/14/2020. FINDINGS: Lower chest: Fibrotic changes in the lung bases bilaterally suggesting  probable usual interstitial pneumonia. Atherosclerotic calcifications in the thoracic aorta as well as the left anterior descending coronary artery. Thickening and calcification of the aortic valve. Hepatobiliary: No definite suspicious cystic or solid hepatic lesions. No intra or extrahepatic biliary ductal dilatation. Gallbladder is unremarkable in appearance. Pancreas: No pancreatic mass. No pancreatic ductal dilatation. No pancreatic or peripancreatic fluid collections or inflammatory changes. Spleen: Unremarkable. Adrenals/Urinary Tract: 1.3 cm low-attenuation nonenhancing lesion in the posterior aspect of the interpolar region of the left kidney, compatible with a simple cyst. Numerous other subcentimeter low-attenuation lesions in both kidneys, too small to definitively characterize, but statistically likely to represent tiny cysts. Bilateral adrenal glands are normal in appearance. No hydroureteronephrosis. Urinary bladder is largely obscured by beam hardening artifact from the patient's left hip arthroplasty. Visualized portions of the urinary bladder demonstrate mural thickening in some multifocal punctate mural calcification, without discrete bladder wall mass. Stomach/Bowel: The appearance of the stomach is normal. There is no pathologic dilatation of small bowel or colon. Numerous colonic diverticula are noted, without surrounding inflammatory changes to suggest an acute diverticulitis at this time. Normal appendix. Vascular/Lymphatic: Aortic atherosclerosis, without evidence of aneurysm or dissection in the abdominal or pelvic vasculature. No lymphadenopathy noted in the abdomen or pelvis. Reproductive: Prostate gland and seminal vesicles are largely obscured by beam hardening artifact from the patient's left hip arthroplasty. Other: No significant volume of ascites.  No pneumoperitoneum. Musculoskeletal: Numerous sclerotic lesions are again noted throughout the visualized axial and appendicular  skeleton, compatible with widespread metastatic disease to the bones. The extent of disease is very similar to recent PET-CT, but progressive compared to prior CT examination 10/14/2020. The best example of this is in the left ilium adjacent to the anterior aspect of the sacroiliac joint, where a sclerotic lesion has enlarged measuring 2.9 x 2.2 cm on today's study (previously 1.7 x 2.3 cm on 10/14/2020). IMPRESSION: 1. Colonic diverticulosis without evidence of acute diverticulitis at this time. 2. Widespread metastatic disease to the bones in this patient with history of prostate cancer, which is stable compared to the recent PET-CT, but progressive compared to more remote prior examination. 3. 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 Outpatient referral to Pulmonology for further clinical evaluation is recommended. 4. Aortic atherosclerosis, as well as the left anterior descending coronary artery disease. 5. There are calcifications of the aortic valve. Echocardiographic correlation for evaluation of potential valvular dysfunction may be warranted if clinically indicated. Electronically Signed   By: DVinnie LangtonM.D.   On: 02/07/2022 10:13   NM PET (PSMA) SKULL TO MID THIGH  Result Date: 01/29/2022 CLINICAL DATA:  Prostate cancer  with bone metastasis. EXAM: NUCLEAR MEDICINE PET SKULL BASE TO THIGH TECHNIQUE: 24.5 mCi F18 Piflufolastat (Pylarify) was injected intravenously. Full-ring PET imaging was performed from the skull base to thigh after the radiotracer. CT data was obtained and used for attenuation correction and anatomic localization. COMPARISON:  None Available. FINDINGS: NECK No radiotracer activity in neck lymph nodes. Incidental CT finding: None. CHEST No radiotracer accumulation within mediastinal or hilar lymph nodes. No suspicious pulmonary nodules on the CT scan. Incidental CT finding: Coronary artery calcification and  aortic atherosclerotic calcification. ABDOMEN/PELVIS Prostate: No focal activity within the prostate gland. Lymph nodes: No abnormal radiotracer accumulation within pelvic or abdominal nodes. Liver: No evidence of liver metastasis. Incidental CT finding: Atherosclerotic calcification of the aorta. SKELETON Multiple foci of intense radiotracer activity within the skeleton. radiotracer avid lesions are present in the pelvis, sacrum, spine, ribs and RIGHT humerus. These intensely radiotracer avid lesions are accompanied by variable sclerosis on the CT portion exam. Example lesion the posterior LEFT iliac bone measures 19 mm with a thin rim of sclerosis and intense radiotracer activity (SUV max equal 31.0). Densely sclerotic lesion the posterior RIGHT iliac bone measures 16 mm with SUV max equal 17 (image 172). Lesion at L4 is peripherally sclerotic and intensely radiotracer avid with SUV max equal 14.7. Lesion in the head of the LEFT clavicle with SUV max equal 38 with minimal CT change Lesion in the proximal RIGHT humerus with SUV max equal 49 with a small sclerotic focus measuring only a few 7 mm (62/4). Overall there approximately is 20 radiotracer avid skeletal lesions. IMPRESSION: 1. Multiple intensely radiotracer avid prostate cancer skeletal metastasis within the pelvis, spine, ribs, and RIGHT humerus. 2. No evidence of visceral metastasis or nodal metastasis. 3. No evidence of local recurrence in the prostate gland. 4.  Aortic Atherosclerosis (ICD10-I70.0). Electronically Signed   By: Suzy Bouchard M.D.   On: 01/29/2022 09:15    Microbiology: Results for orders placed or performed during the hospital encounter of 10/31/06  Urine culture     Status: None   Collection Time: 10/31/06  4:05 AM   Specimen: Urine, Random  Result Value Ref Range Status   Specimen Description URINE, RANDOM  Final   Special Requests NONE  Final   Colony Count >=100,000 COLONIES/ML  Final   Culture ENTEROCOCCUS SPECIES   Final   Report Status 11/02/2006 FINAL  Final   Organism ID, Bacteria ENTEROCOCCUS SPECIES  Final      Susceptibility   Enterococcus species - MIC    AMPICILLIN <=2 Sensitive     LEVOFLOXACIN 1 Sensitive     NITROFURANTOIN <=16 Sensitive     VANCOMYCIN <=1 Sensitive   Culture, blood (routine x 2)     Status: None   Collection Time: 10/31/06  4:08 AM   Specimen: BLOOD  Result Value Ref Range Status   Specimen Description BLOOD RIGHT ARM  Final   Special Requests BOTTLES DRAWN AEROBIC ONLY 10CC IN BOTTLE  Final   Culture NO GROWTH 5 DAYS  Final   Report Status 11/06/2006 FINAL  Final  Culture, blood (routine x 2)     Status: None   Collection Time: 10/31/06  4:20 AM   Specimen: BLOOD  Result Value Ref Range Status   Specimen Description BLOOD LEFT ARM  Final   Special Requests   Final    BOTTLES DRAWN AEROBIC AND ANAEROBIC 10CC IN EACH BOTTLE   Culture NO GROWTH 5 DAYS  Final   Report Status 11/06/2006  FINAL  Final    Labs: CBC: Recent Labs  Lab 02/06/22 1000 02/06/22 1500 02/06/22 2144 02/07/22 0702 02/08/22 0941 02/08/22 1034  WBC 8.2  --   --  6.9 6.1 5.9  NEUTROABS 5.8  --   --   --   --   --   HGB 11.3* 10.5* 10.1* 10.4* 10.6* 10.1*  HCT 36.1* 33.3* 32.7* 34.7* 33.7* 32.3*  MCV 90.5  --   --  95.1 90.3 90.0  PLT 162  --   --  133* 156 324   Basic Metabolic Panel: Recent Labs  Lab 02/06/22 1000 02/07/22 0702 02/08/22 0941  NA 139 135 139  K 4.2 3.9 4.0  CL 107 107 106  CO2 24 20* 24  GLUCOSE 97 76 108*  BUN '20 16 12  '$ CREATININE 1.22 0.97 1.00  CALCIUM 9.0 8.4* 8.9   Liver Function Tests: Recent Labs  Lab 02/06/22 1000 02/07/22 0702  AST 32 31  ALT 24 23  ALKPHOS 31* 31*  BILITOT 0.8 0.9  PROT 6.3* 6.3*  ALBUMIN 3.7 3.6   CBG: No results for input(s): "GLUCAP" in the last 168 hours.  Discharge time spent: greater than 30 minutes.  Signed: Estill Cotta, MD [facility] 02/08/2022

## 2022-02-08 NOTE — Progress Notes (Signed)
Mobility Specialist - Progress Note   02/08/22 1317  Mobility  Activity Ambulated with assistance in hallway  Level of Assistance Modified independent, requires aide device or extra time  Assistive Device None  Distance Ambulated (ft) 700 ft  Activity Response Tolerated well  Mobility Referral Yes  $Mobility charge 1 Mobility   Pt received in chair and agreed to mobility, had no c/o pain nor discomfort during session. Pt returned to chair where all needs were met and family in room.   Roderick Pee Mobility Specialist

## 2022-02-08 NOTE — Telephone Encounter (Signed)
I called Ms Craig Freeman back. [name] apparently had bleeding per rectum and is improving. He will make it to the appointment tomorrow.  [name]

## 2022-02-08 NOTE — Progress Notes (Signed)
PT Cancellation Note  Patient Details Name: Craig Freeman MRN: [id number] DOB: Mar 07, 1924   Cancelled Treatment:    Reason Eval/Treat Not Completed: PT screened, no needs identified, will sign off; per RN pt does not need PT, he has been amb up to 700' with mobility team, no assist, no device.    [facility] 02/08/2022, 2:22 PM

## 2022-02-08 NOTE — Progress Notes (Signed)
[facility] Gastroenterology Progress Note  Craig Freeman 89 y.o. [birth year]   Subjective: Feels good. Denies BMs or bleeding overnight or today. Denies abdominal pain. Hungry. Tolerating clear liquids. In bedside chair. Daughter in room.   Objective: Vital signs: Vitals:   02/08/22 1038 02/08/22 1050  BP: 129/68   Pulse: 65   Resp: 16   Temp: 97.8 F (36.6 C)   SpO2: 100% 99%    Physical Exam: Gen: lethargic, elderly, well-nourished, no acute distress  HEENT: anicteric sclera CV: RRR Chest: CTA B Abd: soft, nontender, nondistended, +BS Ext: no edema  Lab Results: Recent Labs    02/07/22 0702 02/08/22 0941  NA 135 139  K 3.9 4.0  CL 107 106  CO2 20* 24  GLUCOSE 76 108*  BUN 16 12  CREATININE 0.97 1.00  CALCIUM 8.4* 8.9   Recent Labs    02/06/22 1000 02/07/22 0702  AST 32 31  ALT 24 23  ALKPHOS 31* 31*  BILITOT 0.8 0.9  PROT 6.3* 6.3*  ALBUMIN 3.7 3.6   Recent Labs    02/06/22 1000 02/06/22 1500 02/08/22 0941 02/08/22 1034  WBC 8.2   < > 6.1 5.9  NEUTROABS 5.8  --   --   --   HGB 11.3*   < > 10.6* 10.1*  HCT 36.1*   < > 33.7* 32.3*  MCV 90.5   < > 90.3 90.0  PLT 162   < > 156 159   < > = values in this interval not displayed.      Assessment/Plan: Painless hematochezia - no colonic obstruction or mass seen on CT. No further bleeding. Hgb stable. Soft food started. Ok to resume coumadin today. Ok to go home from GI standpoint. F/U with GI prn. Defer to primary team whether to have patient take his prostate cancer med that the daughter desires to give him from home. Will sign off. Call if questions.   [name] 02/08/2022, 11:44 AM  Questions please call [phone] ID: Craig Freeman, male   DOB: [birth year], 89 y.o.   MRN: [id number]

## 2022-02-09 ENCOUNTER — Encounter: Payer: Self-pay | Admitting: Hematology and Oncology

## 2022-02-09 ENCOUNTER — Other Ambulatory Visit: Payer: Self-pay

## 2022-02-09 ENCOUNTER — Inpatient Hospital Stay: Payer: Medicare Other

## 2022-02-09 ENCOUNTER — Inpatient Hospital Stay (HOSPITAL_BASED_OUTPATIENT_CLINIC_OR_DEPARTMENT_OTHER): Payer: Medicare Other | Admitting: Hematology and Oncology

## 2022-02-09 VITALS — BP 99/45 | HR 66 | Temp 97.9°F | Resp 14 | Ht 64.0 in | Wt 135.7 lb

## 2022-02-09 DIAGNOSIS — Z881 Allergy status to other antibiotic agents status: Secondary | ICD-10-CM | POA: Diagnosis not present

## 2022-02-09 DIAGNOSIS — C7951 Secondary malignant neoplasm of bone: Secondary | ICD-10-CM

## 2022-02-09 DIAGNOSIS — C61 Malignant neoplasm of prostate: Secondary | ICD-10-CM

## 2022-02-09 DIAGNOSIS — Z86718 Personal history of other venous thrombosis and embolism: Secondary | ICD-10-CM | POA: Diagnosis not present

## 2022-02-09 DIAGNOSIS — Z880 Allergy status to penicillin: Secondary | ICD-10-CM | POA: Diagnosis not present

## 2022-02-09 DIAGNOSIS — K625 Hemorrhage of anus and rectum: Secondary | ICD-10-CM | POA: Diagnosis not present

## 2022-02-09 DIAGNOSIS — K5790 Diverticulosis of intestine, part unspecified, without perforation or abscess without bleeding: Secondary | ICD-10-CM | POA: Diagnosis not present

## 2022-02-09 DIAGNOSIS — Z79899 Other long term (current) drug therapy: Secondary | ICD-10-CM | POA: Diagnosis not present

## 2022-02-09 DIAGNOSIS — Z7901 Long term (current) use of anticoagulants: Secondary | ICD-10-CM | POA: Diagnosis not present

## 2022-02-09 LAB — COMPREHENSIVE METABOLIC PANEL
ALT: 19 U/L (ref 0–44)
AST: 31 U/L (ref 15–41)
Albumin: 3.9 g/dL (ref 3.5–5.0)
Alkaline Phosphatase: 38 U/L (ref 38–126)
Anion gap: 7 (ref 5–15)
BUN: 26 mg/dL — ABNORMAL HIGH (ref 8–23)
CO2: 27 mmol/L (ref 22–32)
Calcium: 9.3 mg/dL (ref 8.9–10.3)
Chloride: 106 mmol/L (ref 98–111)
Creatinine, Ser: 2.19 mg/dL — ABNORMAL HIGH (ref 0.61–1.24)
GFR, Estimated: 27 mL/min — ABNORMAL LOW (ref >60)
Glucose, Bld: 90 mg/dL (ref 70–99)
Potassium: 4.6 mmol/L (ref 3.5–5.1)
Sodium: 140 mmol/L (ref 135–145)
Total Bilirubin: 0.4 mg/dL (ref 0.3–1.2)
Total Protein: 7 g/dL (ref 6.5–8.1)

## 2022-02-09 LAB — CBC WITH DIFFERENTIAL/PLATELET
Abs Immature Granulocytes: 0.03 10*3/uL (ref 0.00–0.07)
Basophils Absolute: 0 10*3/uL (ref 0.0–0.1)
Basophils Relative: 0 %
Eosinophils Absolute: 0 10*3/uL (ref 0.0–0.5)
Eosinophils Relative: 0 %
HCT: 31.9 % — ABNORMAL LOW (ref 39.0–52.0)
Hemoglobin: 10.2 g/dL — ABNORMAL LOW (ref 13.0–17.0)
Immature Granulocytes: 0 %
Lymphocytes Relative: 16 %
Lymphs Abs: 1.5 10*3/uL (ref 0.7–4.0)
MCH: 28.2 pg (ref 26.0–34.0)
MCHC: 32 g/dL (ref 30.0–36.0)
MCV: 88.1 fL (ref 80.0–100.0)
Monocytes Absolute: 0.7 10*3/uL (ref 0.1–1.0)
Monocytes Relative: 8 %
Neutro Abs: 6.8 10*3/uL (ref 1.7–7.7)
Neutrophils Relative %: 76 %
Platelets: 161 10*3/uL (ref 150–400)
RBC: 3.62 MIL/uL — ABNORMAL LOW (ref 4.22–5.81)
RDW: 13.3 % (ref 11.5–15.5)
WBC: 9.1 10*3/uL (ref 4.0–10.5)
nRBC: 0 % (ref 0.0–0.2)
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
## 2022-02-10 ENCOUNTER — Telehealth: Payer: Self-pay

## 2022-02-10 ENCOUNTER — Other Ambulatory Visit: Payer: Self-pay

## 2022-02-10 ENCOUNTER — Other Ambulatory Visit: Payer: Self-pay | Admitting: Hematology and Oncology

## 2022-02-10 DIAGNOSIS — C7951 Secondary malignant neoplasm of bone: Secondary | ICD-10-CM | POA: Diagnosis not present

## 2022-02-10 DIAGNOSIS — C61 Malignant neoplasm of prostate: Secondary | ICD-10-CM | POA: Diagnosis not present

## 2022-02-10 LAB — PSA, TOTAL AND FREE
PSA, Free Pct: 23.9 %
PSA, Free: 31.1 ng/mL
Prostate Specific Ag, Serum: 130 ng/mL — ABNORMAL HIGH (ref 0.0–4.0)

## 2022-02-10 NOTE — Progress Notes (Signed)
Referral placed to Dr Tammi Klippel for [facility]. Daughter agreeable. We discussed about the jump in BUN and creatinine likely from GI bleed and dehydration. I advised that he drink adequate amount of fluid, we will repeat lab next week again.  [name] '

## 2022-02-10 NOTE — Telephone Encounter (Signed)
S/w pt's daughter and gave recommendations. Educated on importance of fluid intake. Result given for PSA as well. Caren Griffins has questions so Dr Chryl Heck will call her to discuss PSA results.

## 2022-02-10 NOTE — Telephone Encounter (Signed)
-----  Message from [name], MD sent at 02/09/2022 12:32 PM EST ----- His creatinine seems to have taken a hit, could be from his recent GI bleed. He has to continue to hydrate well and can he repeat labs next week? If yes, he will need a BMP ordered and lab appt.  Thanks so much

## 2022-02-11 ENCOUNTER — Telehealth: Payer: Self-pay | Admitting: Radiation Oncology
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
## 2022-02-11 NOTE — Telephone Encounter (Signed)
1/18 @ 8:25 am Spoke to patient's daughter/caretaker.  She would like to confirm with patient if 1/19 @ 10:30 am, over the phone or in person will be ok for consult with Dr. Tammi Klippel.  Will call back to confirm.

## 2022-02-12 ENCOUNTER — Ambulatory Visit
Admission: RE | Admit: 2022-02-12 | Discharge: 2022-02-12 | Disposition: A | Discharge status: Home / Self Care | Payer: Medicare Other | Source: Ambulatory Visit | Attending: Radiation Oncology | Admitting: Radiation Oncology

## 2022-02-12 VITALS — BP 128/57 | HR 87 | Temp 96.9°F | Resp 18 | Ht 64.0 in | Wt 141.1 lb

## 2022-02-12 DIAGNOSIS — E7800 Pure hypercholesterolemia, unspecified: Secondary | ICD-10-CM | POA: Insufficient documentation

## 2022-02-12 DIAGNOSIS — C61 Malignant neoplasm of prostate: Secondary | ICD-10-CM | POA: Diagnosis not present

## 2022-02-12 DIAGNOSIS — I2510 Atherosclerotic heart disease of native coronary artery without angina pectoris: Secondary | ICD-10-CM | POA: Insufficient documentation

## 2022-02-12 DIAGNOSIS — I700 Atherosclerosis of aorta: Secondary | ICD-10-CM | POA: Insufficient documentation

## 2022-02-12 DIAGNOSIS — Z79899 Other long term (current) drug therapy: Secondary | ICD-10-CM | POA: Diagnosis not present

## 2022-02-12 DIAGNOSIS — Z7901 Long term (current) use of anticoagulants: Secondary | ICD-10-CM | POA: Diagnosis not present

## 2022-02-12 DIAGNOSIS — C7951 Secondary malignant neoplasm of bone: Secondary | ICD-10-CM | POA: Insufficient documentation

## 2022-02-12 DIAGNOSIS — I10 Essential (primary) hypertension: Secondary | ICD-10-CM | POA: Diagnosis not present

## 2022-02-12 DIAGNOSIS — E785 Hyperlipidemia, unspecified: Secondary | ICD-10-CM | POA: Diagnosis not present

## 2022-02-12 DIAGNOSIS — K5900 Constipation, unspecified: Secondary | ICD-10-CM | POA: Insufficient documentation

## 2022-02-12 DIAGNOSIS — Z86718 Personal history of other venous thrombosis and embolism: Secondary | ICD-10-CM | POA: Diagnosis not present

## 2022-02-12 NOTE — Progress Notes (Signed)
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    ICD-10-CM   1. Prostate cancer metastatic to bone [facility])  C61    C79.51       HISTORY OF PRESENT ILLNESS: Craig Freeman is a 89 y.o. male seen at the request of Dr. Chryl Heck.  He has a longstanding history of BPH with DOO and elevated PSA previously followed by Dr. Gloriann Loan and managed with finasteride daily and Rapaflo as needed.  At the time of his routine annual follow-up visit on 09/22/2020, he was noted to have a grossly abnormal digital rectal exam and a PSA performed that same day was significantly elevated at 331.  CT C/A/P performed on 10/14/2020 showed a markedly enlarged, heterogeneous prostate gland with prominence of the seminal vesicles bilaterally as well as multiple sclerotic lesions in the bony pelvis and a lytic lesion in the L2 vertebral body, highly concerning for metastatic prostate cancer.  There were no soft tissue metastases in the chest, abdomen or pelvis and no lymphadenopathy evident in the abdomen or pelvis.  He proceeded with a transrectal prostate ultrasound biopsy on 10/28/2020 with a total of 6 biopsy samples taken.  The prostate volume measured 116 g.  The Gleason score was 4+5 in all 6 of 6 biopsy cores, involving both lobes of the prostate.  He was started on ADT with a 37-monthEligard injection the day of biopsy.  NNorville Haggardand XDelton Seewere added to his treatment regimen in December 2022.  His PSA initially responded very nicely with a nadir at 2.76 in April 2023.  In July 2023, the PSA had  increased to 3.93, despite continuing the ENiue NArmenia  His disease became castrate resistant as indicated by the further rise in his PSA, at 24.5 despite castrate level testosterone <10.  He continues with excellent performance status, going to the gym 5 days a week and walking at least 5 days a week as well.  Given the rising PSA and excellent performance status, he was referred to medical oncology to discuss potentially starting chemotherapy versus PARP inhibitor therapy and met with Dr. IChryl Heck on 12/29/21. His PSA was checked that same day was further elevated at 35.6. A PSMA PET scan was performed on 01/27/2022 and demonstrated multiple intensely radiotracer avid skeletal metastases within the pelvis, spine, ribs and right humerus but no evidence of visceral or nodal metastasis.  Unfortunately, he had a recent GI bleed requiring hospital admission from 02/06/22 to 02/08/2022 but he was able to discharge in stable condition and has continued making good progress since that time.  His most recent PSA had increased further, at 130 on February 09, 2022.  Guardant360 testing was sent on 02/09/2022 to see if there were any mutations suitable for immunotherapy or targeted treatments.  Fortunately, he does not have any pain associated with the diffuse bony metastasis and this is not affecting his quality of life or performance status.  Therefore, he has been kindly referred to [facility] to discuss the potential role of Xofigo in the management of osseous metastatic disease secondary to his castrate resistant prostate  cancer.  His daughter, Craig Freeman is accompanying him for today's visit.  PREVIOUS RADIATION THERAPY: No  PAST MEDICAL HISTORY:  Past Medical History:  Diagnosis Date   Cancer ([place])    Constipation    DVT (deep venous thrombosis), right    coumadin   High cholesterol    Hyperlipidemia    Hypertension       PAST SURGICAL HISTORY: Past Surgical History:  Procedure Laterality Date    JOINT REPLACEMENT      FAMILY HISTORY: No family history on file.  SOCIAL HISTORY:  Social History   Socioeconomic History   Marital status: Married    Spouse name: Not on file   Number of children: Not on file   Years of education: Not on file   Highest education level: Not on file  Occupational History   Not on file  Tobacco Use   Smoking status: Never   Smokeless tobacco: Never  Substance and Sexual Activity   Alcohol use: No   Drug use: No   Sexual activity: Not Currently  Other Topics Concern   Not on file  Social History Narrative   Not on file   Social Determinants of Health   Financial Resource Strain: Not on file  Food Insecurity: No Food Insecurity (02/07/2022)   Hunger Vital Sign    Worried About Running Out of Food in the Last Year: Never true    Ran Out of Food in the Last Year: Never true  Transportation Needs: No Transportation Needs (02/07/2022)   PRAPARE - Hydrologist (Medical): No    Lack of Transportation (Non-Medical): No  Physical Activity: Not on file  Stress: Not on file  Social Connections: Not on file  Intimate Partner Violence: Not At Risk (02/07/2022)   Humiliation, Afraid, Rape, and Kick questionnaire    Fear of Current or Ex-Partner: No    Emotionally Abused: No    Physically Abused: No    Sexually Abused: No    ALLERGIES: Amoxicillin, Augmentin [amoxicillin-pot clavulanate], and Zithromax [azithromycin]  MEDICATIONS:  Current Outpatient Medications  Medication Sig Dispense Refill   albuterol (PROVENTIL HFA;VENTOLIN HFA) 108 (90 BASE) MCG/ACT inhaler Inhale 1-2 puffs into the lungs every 6 (six) hours as needed for wheezing or shortness of breath. Shortness of breath     atenolol (TENORMIN) 50 MG tablet Take 50 mg by mouth daily.     atorvastatin (LIPITOR) 40 MG tablet Take 40 mg by mouth daily.     Calcium Carb-Cholecalciferol (CALCIUM 600+D3 PO) Take 1 tablet by mouth 2 (two) times daily with a meal.      [employer] MELATONIN 5 MG TABS Take 5 mg by mouth at bedtime as needed (for sleep).     denosumab (XGEVA) 120 MG/1.7ML SOLN injection Inject 120 mg into the skin every 30 (thirty) days.     ferrous sulfate 325 (65 FE) MG tablet Take 325 mg by mouth 2 (two) times daily with a meal.     fluticasone (FLONASE) 50 MCG/ACT nasal spray Place 2 sprays into both nostrils daily.     levocetirizine (XYZAL) 5 MG tablet Take 5 mg by mouth every evening.     losartan (COZAAR) 50 MG tablet Take 50 mg by mouth daily.     megestrol (MEGACE) 20 MG tablet Take 20 mg by mouth at bedtime as needed (for hot flashes).     NUBEQA 300 MG tablet Take 600 mg by mouth 2 (two) times daily with a meal.  ondansetron (ZOFRAN) 4 MG tablet Take 1 tablet (4 mg total) by mouth every 6 (six) hours as needed for nausea. 30 tablet 0   polyethylene glycol (MIRALAX) 17 g packet Take 17 g by mouth daily as needed for mild constipation or moderate constipation (also available OTC.). 14 each 0   SYSTANE COMPLETE PF 0.6 % SOLN Place 1 drop into both eyes in the morning.     triazolam (HALCION) 0.125 MG tablet Take 0.125 mg by mouth at bedtime as needed for sleep.     warfarin (COUMADIN) 5 MG tablet Take 0.5-1 tablets (2.5-5 mg total) by mouth See admin instructions. Take 2.5 mg by mouth in the morning on Sun/Tues/Thurs and 5 mg on Mon/Wed/Fri/Sat     WIXELA INHUB 100-50 MCG/ACT AEPB Inhale 1 puff into the lungs 2 (two) times daily.     No current facility-administered medications for this visit.    REVIEW OF SYSTEMS:  On review of systems, the patient reports that he is doing well overall.  He denies any chest pain, shortness of breath, cough, fevers, chills, night sweats, unintended weight changes.  He denies any bowel or bladder disturbances, and denies abdominal pain, nausea or vomiting.  He denies any new musculoskeletal or joint aches or pains. A complete review of systems is obtained and is otherwise negative.    PHYSICAL EXAM:  Wt  Readings from Last 3 Encounters:  02/09/22 135 lb 11.2 oz (61.6 kg)  02/06/22 143 lb 4.8 oz (65 kg)  12/29/21 142 lb 6.4 oz (64.6 kg)   Temp Readings from Last 3 Encounters:  02/09/22 97.9 F (36.6 C) (Temporal)  02/08/22 97.8 F (36.6 C) (Oral)  12/29/21 97.9 F (36.6 C) (Temporal)   BP Readings from Last 3 Encounters:  02/09/22 (!) 99/45  02/08/22 129/68  12/29/21 (!) 150/60   Pulse Readings from Last 3 Encounters:  02/09/22 66  02/08/22 65  12/29/21 66    /10  In general this is a well appearing African-American male in no acute distress.  He's alert and oriented x4 and appropriate throughout the examination. Cardiopulmonary assessment is negative for acute distress and he exhibits normal effort.   KPS = 100  100 - Normal; no complaints; no evidence of disease. 90   - Able to carry on normal activity; minor signs or symptoms of disease. 80   - Normal activity with effort; some signs or symptoms of disease. 12   - Cares for self; unable to carry on normal activity or to do active work. 60   - Requires occasional assistance, but is able to care for most of his personal needs. 50   - Requires considerable assistance and frequent medical care. 62   - Disabled; requires special care and assistance. 69   - Severely disabled; hospital admission is indicated although death not imminent. 53   - Very sick; hospital admission necessary; active supportive treatment necessary. 10   - Moribund; fatal processes progressing rapidly. 0     - Dead  Karnofsky DA, Abelmann Herrick, Craver LS and Burchenal [facility] [phone]) The use of the nitrogen mustards in the palliative treatment of carcinoma: with particular reference to bronchogenic carcinoma Cancer 1 634-56  LABORATORY DATA:  Lab Results  Component Value Date   WBC 9.1 02/09/2022   HGB 10.2 (L) 02/09/2022   HCT 31.9 (L) 02/09/2022   MCV 88.1 02/09/2022   PLT 161 02/09/2022   Lab Results  Component Value Date   NA 140 02/09/2022  K 4.6  02/09/2022   CL 106 02/09/2022   CO2 27 02/09/2022   Lab Results  Component Value Date   ALT 19 02/09/2022   AST 31 02/09/2022   ALKPHOS 38 02/09/2022   BILITOT 0.4 02/09/2022     RADIOGRAPHY: CT ABDOMEN PELVIS W WO CONTRAST  Result Date: 02/07/2022 CLINICAL DATA:  89 year old male with history of rectal bleeding. History of prostate cancer with metastatic disease to the bones. * Tracking Code: BO * EXAM: CT ABDOMEN AND PELVIS WITHOUT AND WITH CONTRAST TECHNIQUE: Multidetector CT imaging of the abdomen and pelvis was performed following the standard protocol before and following the bolus administration of intravenous contrast. RADIATION DOSE REDUCTION: This exam was performed according to the departmental dose-optimization program which includes automated exposure control, adjustment of the mA and/or kV according to patient size and/or use of iterative reconstruction technique. CONTRAST:  171m OMNIPAQUE IOHEXOL 300 MG/ML  SOLN COMPARISON:  CT of the abdomen and pelvis 10/14/2020. FINDINGS: Lower chest: Fibrotic changes in the lung bases bilaterally suggesting probable usual interstitial pneumonia. Atherosclerotic calcifications in the thoracic aorta as well as the left anterior descending coronary artery. Thickening and calcification of the aortic valve. Hepatobiliary: No definite suspicious cystic or solid hepatic lesions. No intra or extrahepatic biliary ductal dilatation. Gallbladder is unremarkable in appearance. Pancreas: No pancreatic mass. No pancreatic ductal dilatation. No pancreatic or peripancreatic fluid collections or inflammatory changes. Spleen: Unremarkable. Adrenals/Urinary Tract: 1.3 cm low-attenuation nonenhancing lesion in the posterior aspect of the interpolar region of the left kidney, compatible with a simple cyst. Numerous other subcentimeter low-attenuation lesions in both kidneys, too small to definitively characterize, but statistically likely to represent tiny cysts.  Bilateral adrenal glands are normal in appearance. No hydroureteronephrosis. Urinary bladder is largely obscured by beam hardening artifact from the patient's left hip arthroplasty. Visualized portions of the urinary bladder demonstrate mural thickening in some multifocal punctate mural calcification, without discrete bladder wall mass. Stomach/Bowel: The appearance of the stomach is normal. There is no pathologic dilatation of small bowel or colon. Numerous colonic diverticula are noted, without surrounding inflammatory changes to suggest an acute diverticulitis at this time. Normal appendix. Vascular/Lymphatic: Aortic atherosclerosis, without evidence of aneurysm or dissection in the abdominal or pelvic vasculature. No lymphadenopathy noted in the abdomen or pelvis. Reproductive: Prostate gland and seminal vesicles are largely obscured by beam hardening artifact from the patient's left hip arthroplasty. Other: No significant volume of ascites.  No pneumoperitoneum. Musculoskeletal: Numerous sclerotic lesions are again noted throughout the visualized axial and appendicular skeleton, compatible with widespread metastatic disease to the bones. The extent of disease is very similar to recent PET-CT, but progressive compared to prior CT examination 10/14/2020. The best example of this is in the left ilium adjacent to the anterior aspect of the sacroiliac joint, where a sclerotic lesion has enlarged measuring 2.9 x 2.2 cm on today's study (previously 1.7 x 2.3 cm on 10/14/2020). IMPRESSION: 1. Colonic diverticulosis without evidence of acute diverticulitis at this time. 2. Widespread metastatic disease to the bones in this patient with history of prostate cancer, which is stable compared to the recent PET-CT, but progressive compared to more remote prior examination. 3. 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 Outpatient referral to Pulmonology for  further clinical evaluation is recommended. 4. Aortic atherosclerosis, as well as the left anterior descending coronary artery disease. 5. There are calcifications of the aortic valve. Echocardiographic correlation for evaluation of potential valvular  dysfunction may be warranted if clinically indicated. Electronically Signed   By: Vinnie Langton M.D.   On: 02/07/2022 10:13   NM PET (PSMA) SKULL TO MID THIGH  Result Date: 01/29/2022 CLINICAL DATA:  Prostate cancer with bone metastasis. EXAM: NUCLEAR MEDICINE PET SKULL BASE TO THIGH TECHNIQUE: 24.5 mCi F18 Piflufolastat (Pylarify) was injected intravenously. Full-ring PET imaging was performed from the skull base to thigh after the radiotracer. CT data was obtained and used for attenuation correction and anatomic localization. COMPARISON:  None Available. FINDINGS: NECK No radiotracer activity in neck lymph nodes. Incidental CT finding: None. CHEST No radiotracer accumulation within mediastinal or hilar lymph nodes. No suspicious pulmonary nodules on the CT scan. Incidental CT finding: Coronary artery calcification and aortic atherosclerotic calcification. ABDOMEN/PELVIS Prostate: No focal activity within the prostate gland. Lymph nodes: No abnormal radiotracer accumulation within pelvic or abdominal nodes. Liver: No evidence of liver metastasis. Incidental CT finding: Atherosclerotic calcification of the aorta. SKELETON Multiple foci of intense radiotracer activity within the skeleton. radiotracer avid lesions are present in the pelvis, sacrum, spine, ribs and RIGHT humerus. These intensely radiotracer avid lesions are accompanied by variable sclerosis on the CT portion exam. Example lesion the posterior LEFT iliac bone measures 19 mm with a thin rim of sclerosis and intense radiotracer activity (SUV max equal 31.0). Densely sclerotic lesion the posterior RIGHT iliac bone measures 16 mm with SUV max equal 17 (image 172). Lesion at L4 is peripherally sclerotic  and intensely radiotracer avid with SUV max equal 14.7. Lesion in the head of the LEFT clavicle with SUV max equal 38 with minimal CT change Lesion in the proximal RIGHT humerus with SUV max equal 49 with a small sclerotic focus measuring only a few 7 mm (62/4). Overall there approximately is 20 radiotracer avid skeletal lesions. IMPRESSION: 1. Multiple intensely radiotracer avid prostate cancer skeletal metastasis within the pelvis, spine, ribs, and RIGHT humerus. 2. No evidence of visceral metastasis or nodal metastasis. 3. No evidence of local recurrence in the prostate gland. 4.  Aortic Atherosclerosis (ICD10-I70.0). Electronically Signed   By: Suzy Bouchard M.D.   On: 01/29/2022 09:15      IMPRESSION/PLAN: 1. 89 y.o. 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 Today, we talked to the patient and family about the findings and workup thus far. We discussed the natural history of castrate resistant prostate cancer and general treatment, highlighting the role of Xofigo infusion in the management of the osseous metastases.  We focused on the details of logistics and delivery. The recommendation is to proceed with monthly infusions of Xofigo x6. We will monitor labs prior to each infusion to ensure it is safe to proceed with each treatment. We reviewed the anticipated acute and late sequelae associated with Xofigo in this setting. The patient was encouraged to ask questions that were answered to his stated satisfaction.  At the end of our conversation, the patient elects to proceed with Xofigo infusions. We will share this information with Dr. Chryl Heck and Dr. Junious Silk and proceed with treatment planning accordingly in anticipation of beginning treatment in the near future.  We enjoyed meeting him and his daughter, Caren Griffins, today and look forward to continue to participate in his care.  They  know that they are welcome to call at anytime with any questions or concerns in the interim.  We personally spent 60 minutes in this encounter including chart review,  reviewing radiological studies, meeting face-to-face with the patient, entering orders and completing documentation.    Nicholos Johns, PA-C    Tyler Pita, MD  [facility] Direct Dial: [phone]  Fax: [phone] [email or web address]  Skype  [email or web address]

## 2022-02-14 NOTE — Progress Notes (Signed)
  Radiation Oncology         [phone] ________________________________  Name: Craig Freeman MRN: [id number]  Date: 02/12/2022  DOB: May 04, 1924  Xofigo Treatment Planning Note:  Diagnosis:  Castration resistant prostate cancer with painful bone involvement  Narrative: Craig Freeman is a patient who has been diagnosed with castration resistant prostate cancer with painful bone involvement.  His most recent blood counts show that he remains a good candidate to proceed with Ra-223.  The patient is going to receive Xofigo for his treatment.   Radiation Treatment Planning:  The prescribed radiation activity will be 50 kBq per kg per infusions. The plan is to offer a total of 6 IV administrations of this agent, assuming the blood counts are adequate prior to each administration, with each infusion done at 4 week intervals.  This will be done as an IV administration in the nuclear medicine department, with care to undertake all radiation protection precautions as recommended.   ________________________________  Sheral Apley. Tammi Klippel, M.D.

## 2022-02-16 ENCOUNTER — Telehealth: Payer: Self-pay | Admitting: *Deleted

## 2022-02-16 DIAGNOSIS — K922 Gastrointestinal hemorrhage, unspecified: Secondary | ICD-10-CM | POA: Diagnosis not present

## 2022-02-16 DIAGNOSIS — C61 Malignant neoplasm of prostate: Secondary | ICD-10-CM | POA: Diagnosis not present

## 2022-02-16 DIAGNOSIS — D649 Anemia, unspecified: Secondary | ICD-10-CM | POA: Diagnosis not present

## 2022-02-16 DIAGNOSIS — I700 Atherosclerosis of aorta: Secondary | ICD-10-CM | POA: Diagnosis not present

## 2022-02-16 DIAGNOSIS — Z7901 Long term (current) use of anticoagulants: Secondary | ICD-10-CM | POA: Diagnosis not present

## 2022-02-16 NOTE — Progress Notes (Signed)
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  Patient agreeable to proceed with [facility] for treatment, and we are pending schedule at this time.     RN unable to meet at time of visit.  Left message for call back.   Plan of care in progress.

## 2022-02-16 NOTE — Telephone Encounter (Signed)
CALLED PATIENT TO INFORM OF LAB AND WEIGHT FOR 02-23-22 - ARRIVAL TIME- 12 PM @ [place], PATIENT TO RECEIVE INJECTION ON 03-02-22 - ARRIVAL TIME- 11:45 AM @ [facility], SPOKE WITH PATIENT'S DAUGHTER- [name] AND SHE IS AWARE OF THESE APPTS.

## 2022-02-18 ENCOUNTER — Inpatient Hospital Stay: Payer: Medicare Other

## 2022-02-18 ENCOUNTER — Telehealth: Payer: Self-pay | Admitting: *Deleted

## 2022-02-18 NOTE — Telephone Encounter (Signed)
RETURNED PATIENT'S DAUGHTER'S PHONE CALL ([name]), LVM FOR A RETURN CALL

## 2022-02-19 ENCOUNTER — Encounter: Payer: Self-pay | Admitting: Hematology and Oncology

## 2022-02-19 LAB — GUARDANT 360

## 2022-02-22 ENCOUNTER — Telehealth: Payer: Self-pay | Admitting: *Deleted

## 2022-02-22 NOTE — Telephone Encounter (Signed)
CALLED PATIENT'S DAUGHTER- [name] TO REMIND OF HER DAD'S LAB AND WEIGHT APPT. FOR 02-23-22 @ 12 @ [place], SPOKE WITH PATIENT'S [place] IS AWARE OF THIS APPT.

## 2022-02-23 ENCOUNTER — Ambulatory Visit
Admission: RE | Admit: 2022-02-23 | Discharge: 2022-02-23 | Disposition: A | Discharge status: Home / Self Care | Payer: Medicare Other | Source: Ambulatory Visit | Attending: Radiation Oncology | Admitting: Radiation Oncology

## 2022-02-23 ENCOUNTER — Other Ambulatory Visit: Payer: Self-pay

## 2022-02-23 DIAGNOSIS — C7951 Secondary malignant neoplasm of bone: Secondary | ICD-10-CM | POA: Insufficient documentation

## 2022-02-23 DIAGNOSIS — C61 Malignant neoplasm of prostate: Secondary | ICD-10-CM | POA: Diagnosis not present

## 2022-02-23 LAB — CBC WITH DIFFERENTIAL (CANCER CENTER ONLY)
Abs Immature Granulocytes: 0.02 10*3/uL (ref 0.00–0.07)
Basophils Absolute: 0 10*3/uL (ref 0.0–0.1)
Basophils Relative: 1 %
Eosinophils Absolute: 0.1 10*3/uL (ref 0.0–0.5)
Eosinophils Relative: 2 %
HCT: 30.8 % — ABNORMAL LOW (ref 39.0–52.0)
Hemoglobin: 10 g/dL — ABNORMAL LOW (ref 13.0–17.0)
Immature Granulocytes: 0 %
Lymphocytes Relative: 24 %
Lymphs Abs: 1.5 10*3/uL (ref 0.7–4.0)
MCH: 28.4 pg (ref 26.0–34.0)
MCHC: 32.5 g/dL (ref 30.0–36.0)
MCV: 87.5 fL (ref 80.0–100.0)
Monocytes Absolute: 0.6 10*3/uL (ref 0.1–1.0)
Monocytes Relative: 10 %
Neutro Abs: 4.1 10*3/uL (ref 1.7–7.7)
Neutrophils Relative %: 63 %
Platelet Count: 208 10*3/uL (ref 150–400)
RBC: 3.52 MIL/uL — ABNORMAL LOW (ref 4.22–5.81)
RDW: 14.2 % (ref 11.5–15.5)
WBC Count: 6.4 10*3/uL (ref 4.0–10.5)
nRBC: 0 % (ref 0.0–0.2)

## 2022-02-23 LAB — CMP (CANCER CENTER ONLY)
ALT: 16 U/L (ref 0–44)
AST: 26 U/L (ref 15–41)
Albumin: 3.9 g/dL (ref 3.5–5.0)
Alkaline Phosphatase: 34 U/L — ABNORMAL LOW (ref 38–126)
Anion gap: 6 (ref 5–15)
BUN: 15 mg/dL (ref 8–23)
CO2: 25 mmol/L (ref 22–32)
Calcium: 9.5 mg/dL (ref 8.9–10.3)
Chloride: 109 mmol/L (ref 98–111)
Creatinine: 1.12 mg/dL (ref 0.61–1.24)
GFR, Estimated: >60 mL/min (ref >60)
Glucose, Bld: 70 mg/dL (ref 70–99)
Potassium: 4.6 mmol/L (ref 3.5–5.1)
Sodium: 140 mmol/L (ref 135–145)
Total Bilirubin: 0.7 mg/dL (ref 0.3–1.2)
Total Protein: 6.9 g/dL (ref 6.5–8.1)

## 2022-02-23 NOTE — Progress Notes (Signed)
Labs needed prior to [place] injection per [employer], PA-C.

## 2022-02-25 DIAGNOSIS — D649 Anemia, unspecified: Secondary | ICD-10-CM | POA: Diagnosis not present

## 2022-02-25 DIAGNOSIS — K625 Hemorrhage of anus and rectum: Secondary | ICD-10-CM | POA: Diagnosis not present

## 2022-02-25 LAB — PSA, TOTAL AND FREE
PSA, Free Pct: 21.6 %
PSA, Free: 29 ng/mL
Prostate Specific Ag, Serum: 134 ng/mL — ABNORMAL HIGH (ref 0.0–4.0)

## 2022-02-26 ENCOUNTER — Telehealth: Payer: Self-pay | Admitting: *Deleted

## 2022-02-26 NOTE — Telephone Encounter (Signed)
Returned daughter's (Ms. Tilden Dome)  call requesting most recent PSA results.  Per Dr. Chryl Heck - most recent PSA is 134.0. Gave Ms.Hinson the results. She verbalized understanding.

## 2022-03-01 ENCOUNTER — Telehealth: Payer: Self-pay | Admitting: *Deleted

## 2022-03-01 NOTE — Telephone Encounter (Signed)
Called patient's daughter Scharlene Gloss to remind of her dad's Xofigo inj. for 03-02-22- arrival time- 11:45 am @ [facility], spoke with patient's daughter- Scharlene Gloss and she is aware of this inj.

## 2022-03-02 ENCOUNTER — Ambulatory Visit (HOSPITAL_COMMUNITY)
Admission: RE | Admit: 2022-03-02 | Discharge: 2022-03-02 | Disposition: A | Discharge status: Home / Self Care | Payer: Medicare Other | Source: Ambulatory Visit | Attending: Urology | Admitting: Urology

## 2022-03-02 DIAGNOSIS — C7951 Secondary malignant neoplasm of bone: Secondary | ICD-10-CM | POA: Diagnosis not present

## 2022-03-02 DIAGNOSIS — C61 Malignant neoplasm of prostate: Secondary | ICD-10-CM | POA: Insufficient documentation

## 2022-03-02 MED ORDER — RADIUM RA 223 DICHLORIDE 30 MCCI/ML IV SOLN
92.8200 | Freq: Once | INTRAVENOUS | Status: AC | PRN
  Administered 2022-03-02: 92.82 via INTRAVENOUS

## 2022-03-02 NOTE — Progress Notes (Signed)
  Radiation Oncology         [phone] ________________________________  Name: ESTELLE SKIBICKI MRN: [id number]  Date: 03/02/2022  DOB: Aug 08, 1924  Radium-223 Infusion Note  Diagnosis:  Castration resistant prostate cancer with painful bone involvement  Current Infusion:    1  Planned Infusions:  6  Narrative: Mr. ROCKO FESPERMAN presented to nuclear medicine for treatment. His most recent blood counts were reviewed.  He remains a good candidate to proceed with Ra-223.  The patient was situated in an infusion suite with a contact barrier placed under his arm. Intravenous access was established, using sterile technique, and a normal saline infusion from a syringe was started.  Micro-dosimetry:  The prescribed radiation activity was assayed and confirmed to be within specified tolerance.  Special Treatment Procedure - Infusion:  The nuclear medicine technologist and I personally verified the dose activity to be delivered as specified in the written directive, and verified the patient identification via 2 separate methods.  The syringe containing the dose was attached to an intravenous access and the dose delivered over a minute. No complications were noted.  The total administered dose was 95.6 microcuries.   A saline flush of the line and the syringe that contained the isotope was then performed.  The residual radioactivity in the syringe was 2.8 microcuries, so the actual infused isotope activity was 92.8 microcuries.   Pressure was applied to the venipuncture site, and a compression bandage placed.   Radiation Safety personnel were present to perform the discharge survey, as detailed on their documentation.   After a short period of observation, the patient had his IV removed.  Impression:  The patient tolerated his infusion relatively well.  Plan:  The patient will return in one month for ongoing care.    ________________________________  Sheral Apley. Tammi Klippel, M.D.

## 2022-03-02 NOTE — Addendum Note (Signed)
Encounter addended by: Marylynn Pearson, RT on: 03/02/2022 12:51 PM  Actions taken: Imaging Exam ended, Order list changed, MAR administration accepted, Charge Capture section accepted

## 2022-03-02 NOTE — Addendum Note (Signed)
Encounter addended by: Marylynn Pearson, RT on: 03/02/2022 12:48 PM  Actions taken: Imaging Exam begun

## 2022-03-10 DIAGNOSIS — C7951 Secondary malignant neoplasm of bone: Secondary | ICD-10-CM | POA: Diagnosis not present

## 2022-03-10 DIAGNOSIS — C61 Malignant neoplasm of prostate: Secondary | ICD-10-CM | POA: Diagnosis not present

## 2022-03-11 ENCOUNTER — Other Ambulatory Visit: Payer: Self-pay

## 2022-03-11 ENCOUNTER — Inpatient Hospital Stay: Payer: Medicare Other | Attending: Hematology and Oncology | Admitting: Hematology and Oncology

## 2022-03-11 VITALS — BP 132/62 | HR 64 | Temp 97.7°F | Resp 16 | Ht 64.0 in | Wt 135.9 lb

## 2022-03-11 DIAGNOSIS — C7951 Secondary malignant neoplasm of bone: Secondary | ICD-10-CM | POA: Diagnosis not present

## 2022-03-11 DIAGNOSIS — Z880 Allergy status to penicillin: Secondary | ICD-10-CM | POA: Insufficient documentation

## 2022-03-11 DIAGNOSIS — Z7901 Long term (current) use of anticoagulants: Secondary | ICD-10-CM | POA: Insufficient documentation

## 2022-03-11 DIAGNOSIS — Z86718 Personal history of other venous thrombosis and embolism: Secondary | ICD-10-CM | POA: Diagnosis not present

## 2022-03-11 DIAGNOSIS — Z881 Allergy status to other antibiotic agents status: Secondary | ICD-10-CM | POA: Diagnosis not present

## 2022-03-11 DIAGNOSIS — C61 Malignant neoplasm of prostate: Secondary | ICD-10-CM | POA: Diagnosis not present

## 2022-03-11 DIAGNOSIS — Z79899 Other long term (current) drug therapy: Secondary | ICD-10-CM | POA: Diagnosis not present

## 2022-03-11 NOTE — Progress Notes (Signed)
[place] NOTE  Patient Care Team: Wenda Low, MD as PCP - General (Internal Medicine) Katheren Puller, RN as Oncology Nurse Navigator Festus Aloe, MD as Consulting Physician (Urology)  CHIEF COMPLAINTS/PURPOSE OF CONSULTATION:  Metastatic prostate cancer  SUMMARY OF ONCOLOGIC HISTORY: Oncology History  Prostate cancer metastatic to bone [facility])  10/28/2020 Cancer Staging   Staging form: Prostate, AJCC 8th Edition - Clinical stage from 10/28/2020: Stage IVB (cT2c, cN0, cM1b, PSA: 331, Grade Group: 5) - Signed by Freeman Caldron, PA-C on 02/12/2022 Histopathologic type: Adenocarcinoma, NOS Stage prefix: Initial diagnosis Prostate specific antigen (PSA) range: 20 or greater Gleason primary pattern: 4 Gleason secondary pattern: 5 Gleason score: 9 Histologic grading system: 5 grade system Number of biopsy cores examined: 6 Number of biopsy cores positive: 6 Location of positive needle core biopsies: Both sides   12/30/2021 Initial Diagnosis   Prostate cancer metastatic to bone [facility])    Chronology  Patient was diagnosed with metastatic prostate cancer in October 2022 with PSA of 331 at baseline, T2/T3 disease on digital rectal exam, prostate being 115 g.  He had CT imaging with contrast at that time which showed sclerotic lesions and bony pelvis as well as L2 vertebral body.  No lymphadenopathy or soft tissue metastasis.  Started ADT in October 2022 and now back in June 2023.  His PSA nadir was 2.76 in April 2023 but rose to 4 in July 2023.   He also started on Xgeva in Jan 2023.  His PSA has risen to 24.5 in November 2023.  He is very active 89 year old, goes to the gym every day walks 5 times a week.  He denies any complaints, no new bone pains, change in breathing or bowel habits.  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  Interval history Ms. Ottaviano is  here for follow-up with her daughter.  Since his last visit here he had his first dose of Xofigo and tolerated it really well.  No complaints.  He will be getting monthly infusions for 6 infusions.  He denies any aches or pains today.  He has been trying to drink more water.  Rest of the pertinent 10 point ROS reviewed and negative.  MEDICAL HISTORY:  Past Medical History:  Diagnosis Date   Cancer ([place])    Constipation    DVT (deep venous thrombosis), right    coumadin   High cholesterol    Hyperlipidemia    Hypertension     SURGICAL HISTORY: Past Surgical History:  Procedure Laterality Date   JOINT REPLACEMENT      SOCIAL HISTORY: Social History   Socioeconomic History   Marital status: Married    Spouse name: Not on file   Number of children: Not on file   Years of education: Not on file   Highest education level: Not on file  Occupational History   Not on file  Tobacco Use   Smoking status: Never   Smokeless tobacco: Never  Substance and Sexual Activity   Alcohol use: No   Drug use: No   Sexual activity: Not Currently  Other Topics Concern   Not on file  Social History Narrative   Not on file   Social Determinants of Health   Financial Resource Strain: Not on file  Food Insecurity: No Food Insecurity (02/12/2022)   Hunger Vital Sign    Worried About Running Out of Food in the Last Year: Never  true    Ran Out of Food in the Last Year: Never true  Transportation Needs: No Transportation Needs (02/12/2022)   PRAPARE - Hydrologist (Medical): No    Lack of Transportation (Non-Medical): No  Physical Activity: Not on file  Stress: Not on file  Social Connections: Not on file  Intimate Partner Violence: Not At Risk (02/12/2022)   Humiliation, Afraid, Rape, and Kick questionnaire    Fear of Current or Ex-Partner: No    Emotionally Abused: No    Physically Abused: No    Sexually Abused: No    FAMILY HISTORY: No family history on  file.  ALLERGIES:  is allergic to amoxicillin, augmentin [amoxicillin-pot clavulanate], and zithromax [azithromycin].  MEDICATIONS:  Current Outpatient Medications  Medication Sig Dispense Refill   albuterol (PROVENTIL HFA;VENTOLIN HFA) 108 (90 BASE) MCG/ACT inhaler Inhale 1-2 puffs into the lungs every 6 (six) hours as needed for wheezing or shortness of breath. Shortness of breath     atenolol (TENORMIN) 50 MG tablet Take 50 mg by mouth daily.     atorvastatin (LIPITOR) 40 MG tablet Take 40 mg by mouth daily.     Calcium Carb-Cholecalciferol (CALCIUM 600+D3 PO) Take 1 tablet by mouth 2 (two) times daily with a meal.     [employer] MELATONIN 5 MG TABS Take 5 mg by mouth at bedtime as needed (for sleep).     denosumab (XGEVA) 120 MG/1.7ML SOLN injection Inject 120 mg into the skin every 30 (thirty) days.     ferrous sulfate 325 (65 FE) MG tablet Take 325 mg by mouth 2 (two) times daily with a meal.     fluticasone (FLONASE) 50 MCG/ACT nasal spray Place 2 sprays into both nostrils daily.     levocetirizine (XYZAL) 5 MG tablet Take 5 mg by mouth every evening.     losartan (COZAAR) 50 MG tablet Take 50 mg by mouth daily.     megestrol (MEGACE) 20 MG tablet Take 20 mg by mouth at bedtime as needed (for hot flashes).     NUBEQA 300 MG tablet Take 600 mg by mouth 2 (two) times daily with a meal.     ondansetron (ZOFRAN) 4 MG tablet Take 1 tablet (4 mg total) by mouth every 6 (six) hours as needed for nausea. 30 tablet 0   polyethylene glycol (MIRALAX) 17 g packet Take 17 g by mouth daily as needed for mild constipation or moderate constipation (also available OTC.). 14 each 0   SYSTANE COMPLETE PF 0.6 % SOLN Place 1 drop into both eyes in the morning.     triazolam (HALCION) 0.125 MG tablet Take 0.125 mg by mouth at bedtime as needed for sleep.     warfarin (COUMADIN) 5 MG tablet Take 0.5-1 tablets (2.5-5 mg total) by mouth See admin instructions. Take 2.5 mg by mouth in the morning on Sun/Tues/Thurs and  5 mg on Mon/Wed/Fri/Sat     WIXELA INHUB 100-50 MCG/ACT AEPB Inhale 1 puff into the lungs 2 (two) times daily.     No current facility-administered medications for this visit.    REVIEW OF SYSTEMS:   Constitutional: Denies fevers, chills or abnormal night sweats Eyes: Denies blurriness of vision, double vision or watery eyes Ears, nose, mouth, throat, and face: Denies mucositis or sore throat Respiratory: Denies cough, dyspnea or wheezes Cardiovascular: Denies palpitation, chest discomfort or lower extremity swelling Gastrointestinal:  Denies nausea, heartburn or change in bowel habits Skin: Denies abnormal skin rashes Lymphatics: Denies  new lymphadenopathy or easy bruising Neurological:Denies numbness, tingling or new weaknesses Behavioral/Psych: Mood is stable, no new changes  Breast: Denies any palpable lumps or discharge All other systems were reviewed with the patient and are negative.  PHYSICAL EXAMINATION: ECOG PERFORMANCE STATUS: 0 - Asymptomatic  Vitals:   03/11/22 1150  BP: 132/62  Pulse: 64  Resp: 16  Temp: 97.7 F (36.5 C)  SpO2: 100%   Filed Weights   03/11/22 1150  Weight: 135 lb 14.4 oz (61.6 kg)    GENERAL:alert, no distress and comfortable SKIN: skin color, texture, turgor are normal, no rashes or significant lesions EYES: normal, conjunctiva are pink and non-injected, sclera clear OROPHARYNX:no exudate, no erythema and lips, buccal mucosa, and tongue normal  NECK: supple, thyroid normal size, non-tender, without nodularity LYMPH:  no palpable lymphadenopathy in the cervical,  LUNGS: clear to auscultation and percussion with normal breathing effort HEART: regular rate & rhythm and no murmurs and no lower extremity edema ABDOMEN:abdomen soft, non-tender and normal bowel sounds Musculoskeletal:no cyanosis of digits and no clubbing  PSYCH: alert & oriented x 3 with fluent speech NEURO: no focal motor/sensory deficits   LABORATORY DATA:  I have  reviewed the data as listed Lab Results  Component Value Date   WBC 6.4 02/23/2022   HGB 10.0 (L) 02/23/2022   HCT 30.8 (L) 02/23/2022   MCV 87.5 02/23/2022   PLT 208 02/23/2022   Lab Results  Component Value Date   NA 140 02/23/2022   K 4.6 02/23/2022   CL 109 02/23/2022   CO2 25 02/23/2022    RADIOGRAPHIC STUDIES: I have personally reviewed the radiological reports and agreed with the findings in the report.  ASSESSMENT AND PLAN:  Prostate cancer metastatic to bone [facility]) This is a very pleasant 89 year old male patient with newly diagnosed castrate resistant metastatic prostate cancer referred to medical oncology for recommendations.  Patient was diagnosed with metastatic prostate cancer back in October 2022 with a PSA of 331 at baseline currently on ADT and Nubeqa.  He was not on full dose of Nubeqa due to funding and he most recently was titrated to full dose in August or September 2023.  His PSA back in November was 24.5 hence he was referred to medical oncology for additional recommendations.  During his last visit we have discussed about proceeding with PSMA PET, repeating PSA and returning to clinic.   PSMA PET confirmed multiple intense skeletal metastasis, no evidence of visceral metastasis. Given skeletal only mets, we have advised proceeding with Xofigo and he got his first infusion.  He tolerated this very well.  He will continue monthly infusions for 6 times.  Last PSA in January stable.  I have advised her to continue follow-up with Dr. Tammi Klippel and monthly labs as advised and return to clinic to see me at the end of April and they are pleased with this plan.  Thank you for consulting [place] in the care of this patient.  Please do not hesitate to contact us with any additional questions or concerns.    Total time spent 30 minutes including history, physical exam, review of records, counseling and coordination of care All questions were answered. The patient knows to call  the clinic with any problems, questions or concerns.    [name], MD 03/11/22

## 2022-03-11 NOTE — Assessment & Plan Note (Signed)
This is a very pleasant 89 year old male patient with newly diagnosed castrate resistant metastatic prostate cancer referred to medical oncology for recommendations.  Patient was diagnosed with metastatic prostate cancer back in October 2022 with a PSA of 331 at baseline currently on ADT and Nubeqa.  He was not on full dose of Nubeqa due to funding and he most recently was titrated to full dose in August or September 2023.  His PSA back in November was 24.5 hence he was referred to medical oncology for additional recommendations.  During his last visit we have discussed about proceeding with PSMA PET, repeating PSA and returning to clinic.   PSMA PET confirmed multiple intense skeletal metastasis, no evidence of visceral metastasis. Given skeletal only mets, we have advised proceeding with Xofigo and he got his first infusion.  He tolerated this very well.  He will continue monthly infusions for 6 times.  Last PSA in January stable.  I have advised her to continue follow-up with Dr. Tammi Klippel and monthly labs as advised and return to clinic to see me at the end of April and they are pleased with this plan.  Thank you for consulting [place] in the care of this patient.  Please do not hesitate to contact us with any additional questions or concerns.

## 2022-03-16 DIAGNOSIS — Z7901 Long term (current) use of anticoagulants: Secondary | ICD-10-CM | POA: Diagnosis not present

## 2022-03-23 ENCOUNTER — Telehealth: Payer: Self-pay | Admitting: *Deleted

## 2022-03-23 NOTE — Telephone Encounter (Signed)
CALLED PATIENT'S DAUGHTER- [name] TO REMIND OF LAB AND WEIGHT FOR 03-25-22 @ 12 PM, LVM FOR A RETURN CALL

## 2022-03-24 DIAGNOSIS — C61 Malignant neoplasm of prostate: Secondary | ICD-10-CM | POA: Diagnosis not present

## 2022-03-24 DIAGNOSIS — C7951 Secondary malignant neoplasm of bone: Secondary | ICD-10-CM | POA: Diagnosis not present

## 2022-03-25 ENCOUNTER — Ambulatory Visit
Admission: RE | Admit: 2022-03-25 | Discharge: 2022-03-25 | Disposition: A | Discharge status: Home / Self Care | Payer: Medicare Other | Source: Ambulatory Visit | Attending: Radiation Oncology | Admitting: Radiation Oncology

## 2022-03-25 ENCOUNTER — Other Ambulatory Visit: Payer: Self-pay

## 2022-03-25 DIAGNOSIS — C7951 Secondary malignant neoplasm of bone: Secondary | ICD-10-CM | POA: Diagnosis not present

## 2022-03-25 DIAGNOSIS — C61 Malignant neoplasm of prostate: Secondary | ICD-10-CM | POA: Diagnosis not present

## 2022-03-25 LAB — CBC WITH DIFFERENTIAL/PLATELET
Abs Immature Granulocytes: 0 10*3/uL (ref 0.00–0.07)
Basophils Absolute: 0 10*3/uL (ref 0.0–0.1)
Basophils Relative: 1 %
Eosinophils Absolute: 0 10*3/uL (ref 0.0–0.5)
Eosinophils Relative: 1 %
HCT: 32.8 % — ABNORMAL LOW (ref 39.0–52.0)
Hemoglobin: 10.3 g/dL — ABNORMAL LOW (ref 13.0–17.0)
Immature Granulocytes: 0 %
Lymphocytes Relative: 22 %
Lymphs Abs: 1.1 10*3/uL (ref 0.7–4.0)
MCH: 28.1 pg (ref 26.0–34.0)
MCHC: 31.4 g/dL (ref 30.0–36.0)
MCV: 89.6 fL (ref 80.0–100.0)
Monocytes Absolute: 0.5 10*3/uL (ref 0.1–1.0)
Monocytes Relative: 11 %
Neutro Abs: 3.2 10*3/uL (ref 1.7–7.7)
Neutrophils Relative %: 65 %
Platelets: 141 10*3/uL — ABNORMAL LOW (ref 150–400)
RBC: 3.66 MIL/uL — ABNORMAL LOW (ref 4.22–5.81)
RDW: 13.9 % (ref 11.5–15.5)
WBC: 4.9 10*3/uL (ref 4.0–10.5)
nRBC: 0 % (ref 0.0–0.2)

## 2022-03-25 LAB — COMPREHENSIVE METABOLIC PANEL
ALT: 17 U/L (ref 0–44)
AST: 25 U/L (ref 15–41)
Albumin: 4 g/dL (ref 3.5–5.0)
Alkaline Phosphatase: 39 U/L (ref 38–126)
Anion gap: 6 (ref 5–15)
BUN: 16 mg/dL (ref 8–23)
CO2: 25 mmol/L (ref 22–32)
Calcium: 8.7 mg/dL — ABNORMAL LOW (ref 8.9–10.3)
Chloride: 112 mmol/L — ABNORMAL HIGH (ref 98–111)
Creatinine, Ser: 0.83 mg/dL (ref 0.61–1.24)
GFR, Estimated: >60 mL/min (ref >60)
Glucose, Bld: 76 mg/dL (ref 70–99)
Potassium: 4.2 mmol/L (ref 3.5–5.1)
Sodium: 143 mmol/L (ref 135–145)
Total Bilirubin: 0.4 mg/dL (ref 0.3–1.2)
Total Protein: 6.1 g/dL — ABNORMAL LOW (ref 6.5–8.1)

## 2022-03-30 ENCOUNTER — Telehealth: Payer: Self-pay

## 2022-03-30 NOTE — Telephone Encounter (Signed)
Returned daughter's call, Caren Griffins, regarding PSA and guardant 360 results. PSA not drawn on 02/29, Caren Griffins reports she will follow up with Dr. Tammi Klippel. Per Dr. Chryl Heck, Guardant 360 results show no mutations to target and that we are on track with current tx. Caren Griffins verbalized understanding.

## 2022-03-31 ENCOUNTER — Telehealth: Payer: Self-pay | Admitting: *Deleted

## 2022-03-31 NOTE — Telephone Encounter (Signed)
CALLED PATIENT'S DAUGHTER- [name] TO REMIND OF XOFIGO INJ. FOR 04-01-22- ARRIVAL TIME- 11:45 AM @ [facility], SPOKE WITH PATIENT'S DAUGHTER [name] AND HE IS AWARE OF THIS INJ.

## 2022-04-01 ENCOUNTER — Ambulatory Visit (HOSPITAL_COMMUNITY)
Admission: RE | Admit: 2022-04-01 | Discharge: 2022-04-01 | Disposition: A | Discharge status: Home / Self Care | Payer: Medicare Other | Source: Ambulatory Visit | Attending: Urology | Admitting: Urology

## 2022-04-01 ENCOUNTER — Other Ambulatory Visit (HOSPITAL_COMMUNITY): Payer: Self-pay | Admitting: Radiation Oncology

## 2022-04-01 ENCOUNTER — Other Ambulatory Visit (HOSPITAL_COMMUNITY): Payer: Self-pay | Admitting: Urology

## 2022-04-01 ENCOUNTER — Inpatient Hospital Stay (HOSPITAL_COMMUNITY): Admission: RE | Admit: 2022-04-01 | Payer: Medicare Other | Source: Ambulatory Visit

## 2022-04-01 ENCOUNTER — Ambulatory Visit (HOSPITAL_COMMUNITY): Admission: RE | Admit: 2022-04-01 | Payer: Medicare Other | Source: Ambulatory Visit

## 2022-04-01 DIAGNOSIS — C61 Malignant neoplasm of prostate: Secondary | ICD-10-CM | POA: Diagnosis not present

## 2022-04-01 DIAGNOSIS — C7951 Secondary malignant neoplasm of bone: Secondary | ICD-10-CM | POA: Diagnosis not present

## 2022-04-01 MED ORDER — RADIUM RA 223 DICHLORIDE 30 MCCI/ML IV SOLN
92.5000 | Freq: Once | INTRAVENOUS | Status: AC
  Administered 2022-04-01: 92.5 via INTRAVENOUS

## 2022-04-05 NOTE — Progress Notes (Signed)
  Radiation Oncology         (336) [phone] ________________________________  Name: Craig Freeman MRN: [id number]  Date: 04/01/2022  DOB: 04/21/1924  Radium-223 Infusion Note  Diagnosis:  Castration resistant prostate cancer with painful bone involvement  Current Infusion:    2  Planned Infusions:  6  Narrative: Mr. Craig Freeman presented to nuclear medicine for treatment. His most recent blood counts were reviewed.  He remains a good candidate to proceed with Ra-223.  The patient was situated in an infusion suite with a contact barrier placed under his arm. Intravenous access was established, using sterile technique, and a normal saline infusion from a syringe was started.  Micro-dosimetry:  The prescribed radiation activity was assayed and confirmed to be within specified tolerance.  Special Treatment Procedure - Infusion:  The nuclear medicine technologist and I personally verified the dose activity to be delivered as specified in the written directive, and verified the patient identification via 2 separate methods.  The syringe containing the dose was attached to an intravenous access and the dose delivered over a minute. No complications were noted.  The total administered dose was 96.3 microcuries.   A saline flush of the line and the syringe that contained the isotope was then performed.  The residual radioactivity in the syringe was 3.8 microcuries, so the actual infused isotope activity was 92.5 microcuries.   Pressure was applied to the venipuncture site, and a compression bandage placed.   Radiation Safety personnel were present to perform the discharge survey, as detailed on their documentation.   After a short period of observation, the patient had his IV removed.  Impression:  The patient tolerated his infusion relatively well.  Plan:  The patient will return in one month for ongoing care.    ________________________________  Sheral Apley. Tammi Klippel, M.D.

## 2022-04-07 DIAGNOSIS — C7951 Secondary malignant neoplasm of bone: Secondary | ICD-10-CM | POA: Diagnosis not present

## 2022-04-07 DIAGNOSIS — C61 Malignant neoplasm of prostate: Secondary | ICD-10-CM | POA: Diagnosis not present

## 2022-04-08 ENCOUNTER — Other Ambulatory Visit: Payer: Self-pay

## 2022-04-08 DIAGNOSIS — C7951 Secondary malignant neoplasm of bone: Secondary | ICD-10-CM

## 2022-04-13 ENCOUNTER — Telehealth: Payer: Self-pay | Admitting: *Deleted

## 2022-04-13 DIAGNOSIS — Z8673 Personal history of transient ischemic attack (TIA), and cerebral infarction without residual deficits: Secondary | ICD-10-CM | POA: Diagnosis not present

## 2022-04-13 DIAGNOSIS — R7303 Prediabetes: Secondary | ICD-10-CM | POA: Diagnosis not present

## 2022-04-13 DIAGNOSIS — I825Z1 Chronic embolism and thrombosis of unspecified deep veins of right distal lower extremity: Secondary | ICD-10-CM | POA: Diagnosis not present

## 2022-04-13 DIAGNOSIS — Z7901 Long term (current) use of anticoagulants: Secondary | ICD-10-CM | POA: Diagnosis not present

## 2022-04-13 DIAGNOSIS — C61 Malignant neoplasm of prostate: Secondary | ICD-10-CM | POA: Diagnosis not present

## 2022-04-13 DIAGNOSIS — N1831 Chronic kidney disease, stage 3a: Secondary | ICD-10-CM | POA: Diagnosis not present

## 2022-04-13 DIAGNOSIS — D696 Thrombocytopenia, unspecified: Secondary | ICD-10-CM | POA: Diagnosis not present

## 2022-04-13 DIAGNOSIS — I700 Atherosclerosis of aorta: Secondary | ICD-10-CM | POA: Diagnosis not present

## 2022-04-13 DIAGNOSIS — I10 Essential (primary) hypertension: Secondary | ICD-10-CM | POA: Diagnosis not present

## 2022-04-13 DIAGNOSIS — J45909 Unspecified asthma, uncomplicated: Secondary | ICD-10-CM | POA: Diagnosis not present

## 2022-04-13 NOTE — Telephone Encounter (Signed)
CALLED PATIENT'S DAUGHTER- [name] TO INFORM OF LAB AND WEIGHT ON 04-29-22 @ 12 PM @ [place]. ON 05-06-22- ARRIVAL TIME- 11:45 AM @ [facility], SPOKE WITH PATIENT'S DAUGHTER- [name] AND SHE IS AWARE OF THESE APPTS.

## 2022-04-15 DIAGNOSIS — H903 Sensorineural hearing loss, bilateral: Secondary | ICD-10-CM | POA: Diagnosis not present

## 2022-04-28 ENCOUNTER — Telehealth: Payer: Self-pay | Admitting: *Deleted

## 2022-04-28 NOTE — Telephone Encounter (Signed)
CALLED PATIENT'S DAUGHTER [name] TO REMIND OF LAB AND WEIGHT FOR 04-29-22 @ 12 PM @ [place], SPOKE WITH MS. HINSON AND SHE IS AWARE OF THIS APPT.

## 2022-04-29 ENCOUNTER — Ambulatory Visit
Admission: RE | Admit: 2022-04-29 | Discharge: 2022-04-29 | Disposition: A | Discharge status: Home / Self Care | Payer: Medicare Other | Source: Ambulatory Visit | Attending: Radiation Oncology | Admitting: Radiation Oncology

## 2022-04-29 ENCOUNTER — Other Ambulatory Visit: Payer: Self-pay

## 2022-04-29 DIAGNOSIS — E785 Hyperlipidemia, unspecified: Secondary | ICD-10-CM | POA: Diagnosis not present

## 2022-04-29 DIAGNOSIS — C7951 Secondary malignant neoplasm of bone: Secondary | ICD-10-CM | POA: Insufficient documentation

## 2022-04-29 DIAGNOSIS — C61 Malignant neoplasm of prostate: Secondary | ICD-10-CM | POA: Insufficient documentation

## 2022-04-29 DIAGNOSIS — Z79899 Other long term (current) drug therapy: Secondary | ICD-10-CM | POA: Diagnosis not present

## 2022-04-29 LAB — COMPREHENSIVE METABOLIC PANEL
ALT: 17 U/L (ref 0–44)
AST: 30 U/L (ref 15–41)
Albumin: 4.2 g/dL (ref 3.5–5.0)
Alkaline Phosphatase: 29 U/L — ABNORMAL LOW (ref 38–126)
Anion gap: 7 (ref 5–15)
BUN: 23 mg/dL (ref 8–23)
CO2: 28 mmol/L (ref 22–32)
Calcium: 9.8 mg/dL (ref 8.9–10.3)
Chloride: 107 mmol/L (ref 98–111)
Creatinine, Ser: 1.06 mg/dL (ref 0.61–1.24)
GFR, Estimated: >60 mL/min (ref >60)
Glucose, Bld: 91 mg/dL (ref 70–99)
Potassium: 4.1 mmol/L (ref 3.5–5.1)
Sodium: 142 mmol/L (ref 135–145)
Total Bilirubin: 0.5 mg/dL (ref 0.3–1.2)
Total Protein: 7.1 g/dL (ref 6.5–8.1)

## 2022-04-29 LAB — CBC WITH DIFFERENTIAL (CANCER CENTER ONLY)
Abs Immature Granulocytes: 0.03 10*3/uL (ref 0.00–0.07)
Basophils Absolute: 0 10*3/uL (ref 0.0–0.1)
Basophils Relative: 1 %
Eosinophils Absolute: 0.1 10*3/uL (ref 0.0–0.5)
Eosinophils Relative: 1 %
HCT: 35.3 % — ABNORMAL LOW (ref 39.0–52.0)
Hemoglobin: 11.4 g/dL — ABNORMAL LOW (ref 13.0–17.0)
Immature Granulocytes: 1 %
Lymphocytes Relative: 22 %
Lymphs Abs: 1 10*3/uL (ref 0.7–4.0)
MCH: 28.5 pg (ref 26.0–34.0)
MCHC: 32.3 g/dL (ref 30.0–36.0)
MCV: 88.3 fL (ref 80.0–100.0)
Monocytes Absolute: 0.5 10*3/uL (ref 0.1–1.0)
Monocytes Relative: 12 %
Neutro Abs: 2.8 10*3/uL (ref 1.7–7.7)
Neutrophils Relative %: 63 %
Platelet Count: 105 10*3/uL — ABNORMAL LOW (ref 150–400)
RBC: 4 MIL/uL — ABNORMAL LOW (ref 4.22–5.81)
RDW: 13.9 % (ref 11.5–15.5)
WBC Count: 4.5 10*3/uL (ref 4.0–10.5)
nRBC: 0 % (ref 0.0–0.2)

## 2022-05-05 ENCOUNTER — Telehealth: Payer: Self-pay | Admitting: *Deleted

## 2022-05-05 NOTE — Telephone Encounter (Signed)
CALLED PATIENT'S DAUGHTER- [name] TO REMIND OF XOFIGO INJ. FOR 05-06-22- ARRIVAL TIME- 11:45 AM @ [facility], SPOKE WITH PATIENT'S DAUGHTER AND SHE IS AWARE OF THIS INJ.

## 2022-05-06 ENCOUNTER — Ambulatory Visit (HOSPITAL_COMMUNITY)
Admission: RE | Admit: 2022-05-06 | Discharge: 2022-05-06 | Disposition: A | Discharge status: Home / Self Care | Payer: Medicare Other | Source: Ambulatory Visit | Attending: Radiation Oncology | Admitting: Radiation Oncology

## 2022-05-06 DIAGNOSIS — C61 Malignant neoplasm of prostate: Secondary | ICD-10-CM

## 2022-05-06 DIAGNOSIS — C7951 Secondary malignant neoplasm of bone: Secondary | ICD-10-CM | POA: Diagnosis not present

## 2022-05-06 MED ORDER — RADIUM RA 223 DICHLORIDE 30 MCCI/ML IV SOLN
94.0500 | Freq: Once | INTRAVENOUS | Status: AC
  Administered 2022-05-06: 94.05 via INTRAVENOUS

## 2022-05-06 NOTE — Progress Notes (Signed)
  Radiation Oncology         (336) [phone] ________________________________  Name: Craig Freeman MRN: [id number]  Date: 05/06/2022  DOB: 1924/10/26  Radium-223 Infusion Note  Diagnosis:  Castration resistant prostate cancer with painful bone involvement  Current Infusion:    3  Planned Infusions:  6  Narrative: Craig Freeman presented to nuclear medicine for treatment. His most recent blood counts were reviewed.  He remains a good candidate to proceed with Ra-223.  The patient was situated in an infusion suite with a contact barrier placed under his arm. Intravenous access was established, using sterile technique, and a normal saline infusion from a syringe was started.  Micro-dosimetry:  The prescribed radiation activity was assayed and confirmed to be within specified tolerance.  Special Treatment Procedure - Infusion:  The nuclear medicine technologist and I personally verified the dose activity to be delivered as specified in the written directive, and verified the patient identification via 2 separate methods.  The syringe containing the dose was attached to an intravenous access and the dose delivered over a minute. No complications were noted.  The total administered dose was 97.6 microcuries.   A saline flush of the line and the syringe that contained the isotope was then performed.  The residual radioactivity in the syringe was 3.55 microcuries, so the actual infused isotope activity was 94.05 microcuries.   Pressure was applied to the venipuncture site, and a compression bandage placed.   Radiation Safety personnel were present to perform the discharge survey, as detailed on their documentation.   After a short period of observation, the patient had his IV removed.  Impression:  The patient tolerated his infusion relatively well.  Plan:  The patient will return in one month for ongoing care.    ________________________________  Artist Pais. Kathrynn Running, M.D.

## 2022-05-11 DIAGNOSIS — Z7901 Long term (current) use of anticoagulants: Secondary | ICD-10-CM | POA: Diagnosis not present

## 2022-05-24 ENCOUNTER — Other Ambulatory Visit: Payer: Self-pay

## 2022-05-24 DIAGNOSIS — C61 Malignant neoplasm of prostate: Secondary | ICD-10-CM

## 2022-05-25 ENCOUNTER — Telehealth: Payer: Self-pay | Admitting: *Deleted

## 2022-05-25 ENCOUNTER — Inpatient Hospital Stay: Payer: Medicare Other | Attending: Hematology and Oncology | Admitting: Hematology and Oncology

## 2022-05-25 ENCOUNTER — Other Ambulatory Visit: Payer: Self-pay

## 2022-05-25 ENCOUNTER — Inpatient Hospital Stay: Payer: Medicare Other

## 2022-05-25 VITALS — BP 134/88 | HR 94 | Temp 97.5°F | Resp 14 | Wt 140.3 lb

## 2022-05-25 DIAGNOSIS — Z881 Allergy status to other antibiotic agents status: Secondary | ICD-10-CM | POA: Diagnosis not present

## 2022-05-25 DIAGNOSIS — C7951 Secondary malignant neoplasm of bone: Secondary | ICD-10-CM | POA: Diagnosis not present

## 2022-05-25 DIAGNOSIS — C61 Malignant neoplasm of prostate: Secondary | ICD-10-CM | POA: Insufficient documentation

## 2022-05-25 DIAGNOSIS — Z7901 Long term (current) use of anticoagulants: Secondary | ICD-10-CM | POA: Diagnosis not present

## 2022-05-25 DIAGNOSIS — Z86718 Personal history of other venous thrombosis and embolism: Secondary | ICD-10-CM | POA: Insufficient documentation

## 2022-05-25 DIAGNOSIS — Z880 Allergy status to penicillin: Secondary | ICD-10-CM | POA: Diagnosis not present

## 2022-05-25 DIAGNOSIS — Z79899 Other long term (current) drug therapy: Secondary | ICD-10-CM | POA: Diagnosis not present

## 2022-05-25 LAB — COMPREHENSIVE METABOLIC PANEL
ALT: 17 U/L (ref 0–44)
AST: 38 U/L (ref 15–41)
Albumin: 4.1 g/dL (ref 3.5–5.0)
Alkaline Phosphatase: 33 U/L — ABNORMAL LOW (ref 38–126)
Anion gap: 6 (ref 5–15)
BUN: 21 mg/dL (ref 8–23)
CO2: 24 mmol/L (ref 22–32)
Calcium: 9.9 mg/dL (ref 8.9–10.3)
Chloride: 110 mmol/L (ref 98–111)
Creatinine, Ser: 1.11 mg/dL (ref 0.61–1.24)
GFR, Estimated: >60 mL/min (ref >60)
Glucose, Bld: 105 mg/dL — ABNORMAL HIGH (ref 70–99)
Potassium: 4.3 mmol/L (ref 3.5–5.1)
Sodium: 140 mmol/L (ref 135–145)
Total Bilirubin: 0.6 mg/dL (ref 0.3–1.2)
Total Protein: 6.6 g/dL (ref 6.5–8.1)

## 2022-05-25 LAB — CBC WITH DIFFERENTIAL/PLATELET
Abs Immature Granulocytes: 0.07 10*3/uL (ref 0.00–0.07)
Basophils Absolute: 0 10*3/uL (ref 0.0–0.1)
Basophils Relative: 1 %
Eosinophils Absolute: 0.1 10*3/uL (ref 0.0–0.5)
Eosinophils Relative: 1 %
HCT: 33.5 % — ABNORMAL LOW (ref 39.0–52.0)
Hemoglobin: 10.6 g/dL — ABNORMAL LOW (ref 13.0–17.0)
Immature Granulocytes: 2 %
Lymphocytes Relative: 25 %
Lymphs Abs: 1.1 10*3/uL (ref 0.7–4.0)
MCH: 27.9 pg (ref 26.0–34.0)
MCHC: 31.6 g/dL (ref 30.0–36.0)
MCV: 88.2 fL (ref 80.0–100.0)
Monocytes Absolute: 0.4 10*3/uL (ref 0.1–1.0)
Monocytes Relative: 10 %
Neutro Abs: 2.6 10*3/uL (ref 1.7–7.7)
Neutrophils Relative %: 61 %
Platelets: 90 10*3/uL — ABNORMAL LOW (ref 150–400)
RBC: 3.8 MIL/uL — ABNORMAL LOW (ref 4.22–5.81)
RDW: 14.3 % (ref 11.5–15.5)
WBC: 4.3 10*3/uL (ref 4.0–10.5)
nRBC: 0.5 % — ABNORMAL HIGH (ref 0.0–0.2)

## 2022-05-25 NOTE — Progress Notes (Signed)
[facility] CONSULT NOTE  Patient Care Team: Georgann Housekeeper, MD as PCP - General (Internal Medicine) Cherlyn Cushing, RN as Oncology Nurse Navigator Jerilee Field, MD as Consulting Physician (Urology)  CHIEF COMPLAINTS/PURPOSE OF CONSULTATION:  Metastatic prostate cancer  SUMMARY OF ONCOLOGIC HISTORY: Oncology History  Prostate cancer metastatic to bone [facility] [address] [address])  10/28/2020 Cancer Staging   Staging form: Prostate, AJCC 8th Edition - Clinical stage from 10/28/2020: Stage IVB (cT2c, cN0, cM1b, PSA: 331, Grade Group: 5) - Signed by Marcello Fennel, PA-C on 02/12/2022 Histopathologic type: Adenocarcinoma, NOS Stage prefix: Initial diagnosis Prostate specific antigen (PSA) range: 20 or greater Gleason primary pattern: 4 Gleason secondary pattern: 5 Gleason score: 9 Histologic grading system: 5 grade system Number of biopsy cores examined: 6 Number of biopsy cores positive: 6 Location of positive needle core biopsies: Both sides   12/30/2021 Initial Diagnosis   Prostate cancer metastatic to bone [facility])    Chronology  Patient was diagnosed with metastatic prostate cancer in October 2022 with PSA of 331 at baseline, T2/T3 disease on digital rectal exam, prostate being 115 g.  He had CT imaging with contrast at that time which showed sclerotic lesions and bony pelvis as well as L2 vertebral body.  No lymphadenopathy or soft tissue metastasis.  Started ADT in October 2022 and now back in June 2023.  His PSA nadir was 2.76 in April 2023 but rose to 4 in July 2023.   He also started on Xgeva in Jan 2023.  His PSA has risen to 24.5 in November 2023.  He is very active 89 year old, goes to the gym every day walks 5 times a week.  He denies any complaints, no new bone pains, change in breathing or bowel habits.  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  Interval history  Ms. Christen  is here for follow-up with her daughter.  Since his last visit here he had 3 doses of Xofigo and tolerated it really well.  No complaints except he is cold all the time.  He will be getting monthly infusions for 6 infusions.  He denies any aches or pains today.  He has been trying to drink more water.  Rest of the pertinent 10 point ROS reviewed and negative.  MEDICAL HISTORY:  Past Medical History:  Diagnosis Date   Cancer (HCC)    Constipation    DVT (deep venous thrombosis), right    coumadin   High cholesterol    Hyperlipidemia    Hypertension     SURGICAL HISTORY: Past Surgical History:  Procedure Laterality Date   JOINT REPLACEMENT      SOCIAL HISTORY: Social History   Socioeconomic History   Marital status: Married    Spouse name: Not on file   Number of children: Not on file   Years of education: Not on file   Highest education level: Not on file  Occupational History   Not on file  Tobacco Use   Smoking status: Never   Smokeless tobacco: Never  Substance and Sexual Activity   Alcohol use: No   Drug use: No   Sexual activity: Not Currently  Other Topics Concern   Not on file  Social History Narrative   Not on file   Social Determinants of Health   Financial Resource Strain: Not on file  Food Insecurity: No Food Insecurity (02/12/2022)   Hunger Vital Sign    Worried About Running Out  of Food in the Last Year: Never true    Ran Out of Food in the Last Year: Never true  Transportation Needs: No Transportation Needs (02/12/2022)   PRAPARE - Administrator, Civil Service (Medical): No    Lack of Transportation (Non-Medical): No  Physical Activity: Not on file  Stress: Not on file  Social Connections: Not on file  Intimate Partner Violence: Not At Risk (02/12/2022)   Humiliation, Afraid, Rape, and Kick questionnaire    Fear of Current or Ex-Partner: No    Emotionally Abused: No    Physically Abused: No    Sexually Abused: No    FAMILY  HISTORY: No family history on file.  ALLERGIES:  is allergic to amoxicillin, augmentin [amoxicillin-pot clavulanate], and zithromax [azithromycin].  MEDICATIONS:  Current Outpatient Medications  Medication Sig Dispense Refill   albuterol (PROVENTIL HFA;VENTOLIN HFA) 108 (90 BASE) MCG/ACT inhaler Inhale 1-2 puffs into the lungs every 6 (six) hours as needed for wheezing or shortness of breath. Shortness of breath     atenolol (TENORMIN) 50 MG tablet Take 50 mg by mouth daily.     atorvastatin (LIPITOR) 40 MG tablet Take 40 mg by mouth daily.     Calcium Carb-Cholecalciferol (CALCIUM 600+D3 PO) Take 1 tablet by mouth 2 (two) times daily with a meal.     [employer] MELATONIN 5 MG TABS Take 5 mg by mouth at bedtime as needed (for sleep).     denosumab (XGEVA) 120 MG/1.7ML SOLN injection Inject 120 mg into the skin every 30 (thirty) days.     ferrous sulfate 325 (65 FE) MG tablet Take 325 mg by mouth 2 (two) times daily with a meal.     fluticasone (FLONASE) 50 MCG/ACT nasal spray Place 2 sprays into both nostrils daily.     levocetirizine (XYZAL) 5 MG tablet Take 5 mg by mouth every evening.     losartan (COZAAR) 50 MG tablet Take 50 mg by mouth daily.     megestrol (MEGACE) 20 MG tablet Take 20 mg by mouth at bedtime as needed (for hot flashes).     NUBEQA 300 MG tablet Take 600 mg by mouth 2 (two) times daily with a meal.     ondansetron (ZOFRAN) 4 MG tablet Take 1 tablet (4 mg total) by mouth every 6 (six) hours as needed for nausea. 30 tablet 0   polyethylene glycol (MIRALAX) 17 g packet Take 17 g by mouth daily as needed for mild constipation or moderate constipation (also available OTC.). 14 each 0   SYSTANE COMPLETE PF 0.6 % SOLN Place 1 drop into both eyes in the morning.     triazolam (HALCION) 0.125 MG tablet Take 0.125 mg by mouth at bedtime as needed for sleep.     warfarin (COUMADIN) 5 MG tablet Take 0.5-1 tablets (2.5-5 mg total) by mouth See admin instructions. Take 2.5 mg by mouth in the  morning on Sun/Tues/Thurs and 5 mg on Mon/Wed/Fri/Sat     WIXELA INHUB 100-50 MCG/ACT AEPB Inhale 1 puff into the lungs 2 (two) times daily.     No current facility-administered medications for this visit.    REVIEW OF SYSTEMS:   Constitutional: Denies fevers, chills or abnormal night sweats Eyes: Denies blurriness of vision, double vision or watery eyes Ears, nose, mouth, throat, and face: Denies mucositis or sore throat Respiratory: Denies cough, dyspnea or wheezes Cardiovascular: Denies palpitation, chest discomfort or lower extremity swelling Gastrointestinal:  Denies nausea, heartburn or change in bowel habits  Skin: Denies abnormal skin rashes Lymphatics: Denies new lymphadenopathy or easy bruising Neurological:Denies numbness, tingling or new weaknesses Behavioral/Psych: Mood is stable, no new changes  Breast: Denies any palpable lumps or discharge All other systems were reviewed with the patient and are negative.  PHYSICAL EXAMINATION: ECOG PERFORMANCE STATUS: 0 - Asymptomatic  Vitals:   05/25/22 1151 05/25/22 1154  BP:  134/88  Pulse: 94 94  Resp: 14 14  Temp: (!) 97.5 F (36.4 C) (!) 97.5 F (36.4 C)  SpO2: 97% 97%   Filed Weights   05/25/22 1151 05/25/22 1154  Weight: 140 lb 4.8 oz (63.6 kg) 140 lb 4.8 oz (63.6 kg)    GENERAL:alert, no distress and comfortable SKIN: skin color, texture, turgor are normal, no rashes or significant lesions EYES: normal, conjunctiva are pink and non-injected, sclera clear OROPHARYNX:no exudate, no erythema and lips, buccal mucosa, and tongue normal  NECK: supple, thyroid normal size, non-tender, without nodularity LYMPH:  no palpable lymphadenopathy in the cervical,  LUNGS: clear to auscultation and percussion with normal breathing effort HEART: regular rate & rhythm and no murmurs, BLE pitting edema, right greater than left. This is chronic according to pt ABDOMEN:abdomen soft, non-tender and normal bowel  sounds Musculoskeletal:no cyanosis of digits and no clubbing  PSYCH: alert & oriented x 3 with fluent speech NEURO: no focal motor/sensory deficits   LABORATORY DATA:  I have reviewed the data as listed Lab Results  Component Value Date   WBC 4.5 04/29/2022   HGB 11.4 (L) 04/29/2022   HCT 35.3 (L) 04/29/2022   MCV 88.3 04/29/2022   PLT 105 (L) 04/29/2022   Lab Results  Component Value Date   NA 142 04/29/2022   K 4.1 04/29/2022   CL 107 04/29/2022   CO2 28 04/29/2022    RADIOGRAPHIC STUDIES: I have personally reviewed the radiological reports and agreed with the findings in the report.  ASSESSMENT AND PLAN:  Prostate cancer metastatic to bone [facility]) This is a very pleasant 89 year old male patient with newly diagnosed castrate resistant metastatic prostate cancer referred to medical oncology for recommendations.  Patient was diagnosed with metastatic prostate cancer back in October 2022 with a PSA of 331 at baseline currently on ADT and Nubeqa.  He was not on full dose of Nubeqa due to funding and he most recently was titrated to full dose in August or September 2023.  His PSA back in November was 24.5 hence he was referred to medical oncology for additional recommendations.  During his last visit we have discussed about proceeding with PSMA PET, repeating PSA and returning to clinic.   PSMA PET confirmed multiple intense skeletal metastasis, no evidence of visceral metastasis. Given skeletal only mets, we have advised proceeding with Xofigo and he got 3/6 infusions so far. Last PSA in January stable. He is doing really well so far. No complaints at all. We can have PSA drawn here today to track progress. Plan imaging at the end of xofigo treatment.  Thank you for consulting [place] in the care of this patient.  Please do not hesitate to contact us with any additional questions or concerns.    Total time spent 30 minutes including history, physical exam, review of records,  counseling and coordination of care All questions were answered. The patient knows to call the clinic with any problems, questions or concerns.    Rachel Moulds, MD 05/25/22

## 2022-05-25 NOTE — Progress Notes (Signed)
[facility] CONSULT NOTE  Patient Care Team: Georgann Housekeeper, MD as PCP - General (Internal Medicine) Cherlyn Cushing, RN as Oncology Nurse Navigator Jerilee Field, MD as Consulting Physician (Urology)  CHIEF COMPLAINTS/PURPOSE OF CONSULTATION:  Metastatic prostate cancer  SUMMARY OF ONCOLOGIC HISTORY: Oncology History  Prostate cancer metastatic to bone [facility])  10/28/2020 Cancer Staging   Staging form: Prostate, AJCC 8th Edition - Clinical stage from 10/28/2020: Stage IVB (cT2c, cN0, cM1b, PSA: 331, Grade Group: 5) - Signed by Marcello Fennel, PA-C on 02/12/2022 Histopathologic type: Adenocarcinoma, NOS Stage prefix: Initial diagnosis Prostate specific antigen (PSA) range: 20 or greater Gleason primary pattern: 4 Gleason secondary pattern: 5 Gleason score: 9 Histologic grading system: 5 grade system Number of biopsy cores examined: 6 Number of biopsy cores positive: 6 Location of positive needle core biopsies: Both sides   12/30/2021 Initial Diagnosis   Prostate cancer metastatic to bone [facility])    Chronology  Patient was diagnosed with metastatic prostate cancer in October 2022 with PSA of 331 at baseline, T2/T3 disease on digital rectal exam, prostate being 115 g.  He had CT imaging with contrast at that time which showed sclerotic lesions and bony pelvis as well as L2 vertebral body.  No lymphadenopathy or soft tissue metastasis.  Started ADT in October 2022 and now back in June 2023.  His PSA nadir was 2.76 in April 2023 but rose to 4 in July 2023.   He also started on Xgeva in Jan 2023.  His PSA has risen to 24.5 in November 2023.  He is very active 89 year old, goes to the gym every day walks 5 times a week.  He denies any complaints, no new bone pains, change in breathing or bowel habits.  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  Interval history Ms. Franchini is  here for follow-up with her daughter.  Since his last visit here he had his first dose of Xofigo and tolerated it really well.  No complaints.  He will be getting monthly infusions for 6 infusions.  He denies any aches or pains today.   Rest of the pertinent 10 point ROS reviewed and negative.  MEDICAL HISTORY:  Past Medical History:  Diagnosis Date   Cancer (HCC)    Constipation    DVT (deep venous thrombosis), right    coumadin   High cholesterol    Hyperlipidemia    Hypertension     SURGICAL HISTORY: Past Surgical History:  Procedure Laterality Date   JOINT REPLACEMENT      SOCIAL HISTORY: Social History   Socioeconomic History   Marital status: Married    Spouse name: Not on file   Number of children: Not on file   Years of education: Not on file   Highest education level: Not on file  Occupational History   Not on file  Tobacco Use   Smoking status: Never   Smokeless tobacco: Never  Substance and Sexual Activity   Alcohol use: No   Drug use: No   Sexual activity: Not Currently  Other Topics Concern   Not on file  Social History Narrative   Not on file   Social Determinants of Health   Financial Resource Strain: Not on file  Food Insecurity: No Food Insecurity (02/12/2022)   Hunger Vital Sign    Worried About Running Out of Food in the Last Year: Never true    Ran Out of Food  in the Last Year: Never true  Transportation Needs: No Transportation Needs (02/12/2022)   PRAPARE - Administrator, Civil Service (Medical): No    Lack of Transportation (Non-Medical): No  Physical Activity: Not on file  Stress: Not on file  Social Connections: Not on file  Intimate Partner Violence: Not At Risk (02/12/2022)   Humiliation, Afraid, Rape, and Kick questionnaire    Fear of Current or Ex-Partner: No    Emotionally Abused: No    Physically Abused: No    Sexually Abused: No    FAMILY HISTORY: No family history on file.  ALLERGIES:  is allergic to  amoxicillin, augmentin [amoxicillin-pot clavulanate], and zithromax [azithromycin].  MEDICATIONS:  Current Outpatient Medications  Medication Sig Dispense Refill   albuterol (PROVENTIL HFA;VENTOLIN HFA) 108 (90 BASE) MCG/ACT inhaler Inhale 1-2 puffs into the lungs every 6 (six) hours as needed for wheezing or shortness of breath. Shortness of breath     atenolol (TENORMIN) 50 MG tablet Take 50 mg by mouth daily.     atorvastatin (LIPITOR) 40 MG tablet Take 40 mg by mouth daily.     Calcium Carb-Cholecalciferol (CALCIUM 600+D3 PO) Take 1 tablet by mouth 2 (two) times daily with a meal.     CVS MELATONIN 5 MG TABS Take 5 mg by mouth at bedtime as needed (for sleep).     denosumab (XGEVA) 120 MG/1.7ML SOLN injection Inject 120 mg into the skin every 30 (thirty) days.     ferrous sulfate 325 (65 FE) MG tablet Take 325 mg by mouth 2 (two) times daily with a meal.     fluticasone (FLONASE) 50 MCG/ACT nasal spray Place 2 sprays into both nostrils daily.     levocetirizine (XYZAL) 5 MG tablet Take 5 mg by mouth every evening.     losartan (COZAAR) 50 MG tablet Take 50 mg by mouth daily.     megestrol (MEGACE) 20 MG tablet Take 20 mg by mouth at bedtime as needed (for hot flashes).     NUBEQA 300 MG tablet Take 600 mg by mouth 2 (two) times daily with a meal.     ondansetron (ZOFRAN) 4 MG tablet Take 1 tablet (4 mg total) by mouth every 6 (six) hours as needed for nausea. 30 tablet 0   polyethylene glycol (MIRALAX) 17 g packet Take 17 g by mouth daily as needed for mild constipation or moderate constipation (also available OTC.). 14 each 0   SYSTANE COMPLETE PF 0.6 % SOLN Place 1 drop into both eyes in the morning.     triazolam (HALCION) 0.125 MG tablet Take 0.125 mg by mouth at bedtime as needed for sleep.     warfarin (COUMADIN) 5 MG tablet Take 0.5-1 tablets (2.5-5 mg total) by mouth See admin instructions. Take 2.5 mg by mouth in the morning on Sun/Tues/Thurs and 5 mg on Mon/Wed/Fri/Sat     WIXELA  INHUB 100-50 MCG/ACT AEPB Inhale 1 puff into the lungs 2 (two) times daily.     No current facility-administered medications for this visit.    REVIEW OF SYSTEMS:   Constitutional: Denies fevers, chills or abnormal night sweats Eyes: Denies blurriness of vision, double vision or watery eyes Ears, nose, mouth, throat, and face: Denies mucositis or sore throat Respiratory: Denies cough, dyspnea or wheezes Cardiovascular: Denies palpitation, chest discomfort or lower extremity swelling Gastrointestinal:  Denies nausea, heartburn or change in bowel habits Skin: Denies abnormal skin rashes Lymphatics: Denies new lymphadenopathy or easy bruising Neurological:Denies numbness, tingling  or new weaknesses Behavioral/Psych: Mood is stable, no new changes All other systems were reviewed with the patient and are negative.  PHYSICAL EXAMINATION: ECOG PERFORMANCE STATUS: 0 - Asymptomatic  Vitals:   05/25/22 1151 05/25/22 1154  BP:  134/88  Pulse: 94 94  Resp: 14 14  Temp: (!) 97.5 F (36.4 C) (!) 97.5 F (36.4 C)  SpO2: 97% 97%   Filed Weights   05/25/22 1151 05/25/22 1154  Weight: 140 lb 4.8 oz (63.6 kg) 140 lb 4.8 oz (63.6 kg)    GENERAL:alert, no distress and comfortable SKIN: skin color, texture, turgor are normal, no rashes or significant lesions EYES: normal, conjunctiva are pink and non-injected, sclera clear OROPHARYNX:no exudate, no erythema and lips, buccal mucosa, and tongue normal  NECK: supple, thyroid normal size, non-tender, without nodularity LYMPH:  no palpable lymphadenopathy in the cervical,  LUNGS: clear to auscultation and percussion with normal breathing effort HEART: regular rate & rhythm and no murmurs and no lower extremity edema ABDOMEN:abdomen soft, non-tender and normal bowel sounds Musculoskeletal:no cyanosis of digits and no clubbing  PSYCH: alert & oriented x 3 with fluent speech NEURO: no focal motor/sensory deficits   LABORATORY DATA:  I have  reviewed the data as listed Lab Results  Component Value Date   WBC 4.5 04/29/2022   HGB 11.4 (L) 04/29/2022   HCT 35.3 (L) 04/29/2022   MCV 88.3 04/29/2022   PLT 105 (L) 04/29/2022   Lab Results  Component Value Date   NA 142 04/29/2022   K 4.1 04/29/2022   CL 107 04/29/2022   CO2 28 04/29/2022    RADIOGRAPHIC STUDIES: I have personally reviewed the radiological reports and agreed with the findings in the report.  ASSESSMENT AND PLAN:  Prostate cancer metastatic to bone [facility]) This is a very pleasant 89 year old male patient with newly diagnosed castrate resistant metastatic prostate cancer referred to medical oncology for recommendations.  Patient was diagnosed with metastatic prostate cancer back in October 2022 with a PSA of 331 at baseline currently on ADT and Nubeqa.  He was not on full dose of Nubeqa due to funding and he most recently was titrated to full dose in August or September 2023.  His PSA back in November was 24.5 hence he was referred to medical oncology for additional recommendations.  During his last visit we have discussed about proceeding with PSMA PET, repeating PSA and returning to clinic.   PSMA PET confirmed multiple intense skeletal metastasis, no evidence of visceral metastasis. Given skeletal only mets, we have advised proceeding with Xofigo and he got 3/6 infusions so far. Last PSA in January stable. He is doing really well so far. No complaints at all. We can have PSA drawn here today to track progress. Plan imaging at the end of xofigo treatment.  Thank you for consulting [place] in the care of this patient.  Please do not hesitate to contact us with any additional questions or concerns.    Total time spent 30 minutes including history, physical exam, review of records, counseling and coordination of care All questions were answered. The patient knows to call the clinic with any problems, questions or concerns.    Rachel Moulds, MD 05/25/22

## 2022-05-25 NOTE — Assessment & Plan Note (Signed)
This is a very pleasant 89 year old male patient with newly diagnosed castrate resistant metastatic prostate cancer referred to medical oncology for recommendations.  Patient was diagnosed with metastatic prostate cancer back in October 2022 with a PSA of 331 at baseline currently on ADT and Nubeqa.  He was not on full dose of Nubeqa due to funding and he most recently was titrated to full dose in August or September 2023.  His PSA back in November was 24.5 hence he was referred to medical oncology for additional recommendations.  During his last visit we have discussed about proceeding with PSMA PET, repeating PSA and returning to clinic.   PSMA PET confirmed multiple intense skeletal metastasis, no evidence of visceral metastasis. Given skeletal only mets, we have advised proceeding with Xofigo and he got 3/6 infusions so far. Last PSA in January stable. He is doing really well so far. No complaints at all. We can have PSA drawn here today to track progress. Plan imaging at the end of xofigo treatment.  Thank you for consulting [place] in the care of this patient.  Please do not hesitate to contact us with any additional questions or concerns.

## 2022-05-25 NOTE — Telephone Encounter (Signed)
CALLED PATIENT TO INFORM OF LAB AND WEIGHT APPT. ON 06-01-22 @ 12 PM @ [facility]  AND HIS XOFIGO INJ. ON 06-08-22- ARRIVAL TIME- 11:45 AM @ [facility], SPOKE WITH PATIENT'S DAUGHTER- [name] AND SHE IS AWARE OF THESE APPTS.

## 2022-05-27 LAB — PSA, TOTAL AND FREE
PSA, Free Pct: >9.3 %
PSA, Free: >50 ng/mL
Prostate Specific Ag, Serum: 540 ng/mL — ABNORMAL HIGH (ref 0.0–4.0)

## 2022-05-28 ENCOUNTER — Telehealth: Payer: Self-pay | Admitting: *Deleted

## 2022-05-28 NOTE — Telephone Encounter (Signed)
This RN post discussion with Dr Al Pimple and her communication with Dr Kathrynn Running called pt's daughter- Noelle Penner.  Discussed issue of the  tumor marker elevation and concerns.  Note pt is not having any issues or changes in status or activities.  Discussed result and need to follow trend of the number for best outcome and treatment decisions- reiterated goal of therapy is to keep Craig Freeman and functional and healthy as long as we can, with [name] stating agreement including how well he is performing.  Informed her the PSA will be rechecked with lab in June which can allow [place] to see a trend which is more indicating of disease status then just the numbers.  Per end of discussion- [name] stated understanding - and no further questions as well as appreciation of call.

## 2022-05-31 ENCOUNTER — Telehealth: Payer: Self-pay | Admitting: *Deleted

## 2022-05-31 NOTE — Telephone Encounter (Signed)
CALLED PATIENT'S DAUGHTER- [name] TO REMIND OF HER DAD'S LAB AND WEIGHT FOR 06-01-22, SPOKE WITH PATIENT'S DAUGHTER [name] AND SHE IS AWARE OF THIS APPT.

## 2022-06-01 ENCOUNTER — Ambulatory Visit
Admission: RE | Admit: 2022-06-01 | Discharge: 2022-06-01 | Disposition: A | Discharge status: Home / Self Care | Payer: Medicare Other | Source: Ambulatory Visit | Attending: Radiation Oncology | Admitting: Radiation Oncology

## 2022-06-01 ENCOUNTER — Other Ambulatory Visit: Payer: Self-pay

## 2022-06-01 DIAGNOSIS — C7951 Secondary malignant neoplasm of bone: Secondary | ICD-10-CM | POA: Insufficient documentation

## 2022-06-01 DIAGNOSIS — C61 Malignant neoplasm of prostate: Secondary | ICD-10-CM | POA: Diagnosis not present

## 2022-06-01 LAB — COMPREHENSIVE METABOLIC PANEL
ALT: 18 U/L (ref 0–44)
AST: 44 U/L — ABNORMAL HIGH (ref 15–41)
Albumin: 4.1 g/dL (ref 3.5–5.0)
Alkaline Phosphatase: 34 U/L — ABNORMAL LOW (ref 38–126)
Anion gap: 7 (ref 5–15)
BUN: 16 mg/dL (ref 8–23)
CO2: 25 mmol/L (ref 22–32)
Calcium: 9.3 mg/dL (ref 8.9–10.3)
Chloride: 109 mmol/L (ref 98–111)
Creatinine, Ser: 1.05 mg/dL (ref 0.61–1.24)
GFR, Estimated: >60 mL/min (ref >60)
Glucose, Bld: 108 mg/dL — ABNORMAL HIGH (ref 70–99)
Potassium: 3.8 mmol/L (ref 3.5–5.1)
Sodium: 141 mmol/L (ref 135–145)
Total Bilirubin: 0.5 mg/dL (ref 0.3–1.2)
Total Protein: 6.6 g/dL (ref 6.5–8.1)

## 2022-06-01 LAB — CBC WITH DIFFERENTIAL (CANCER CENTER ONLY)
Abs Immature Granulocytes: 0.28 10*3/uL — ABNORMAL HIGH (ref 0.00–0.07)
Basophils Absolute: 0.1 10*3/uL (ref 0.0–0.1)
Basophils Relative: 1 %
Eosinophils Absolute: 0 10*3/uL (ref 0.0–0.5)
Eosinophils Relative: 1 %
HCT: 32.3 % — ABNORMAL LOW (ref 39.0–52.0)
Hemoglobin: 10.7 g/dL — ABNORMAL LOW (ref 13.0–17.0)
Immature Granulocytes: 5 %
Lymphocytes Relative: 19 %
Lymphs Abs: 1.2 10*3/uL (ref 0.7–4.0)
MCH: 28.6 pg (ref 26.0–34.0)
MCHC: 33.1 g/dL (ref 30.0–36.0)
MCV: 86.4 fL (ref 80.0–100.0)
Monocytes Absolute: 0.6 10*3/uL (ref 0.1–1.0)
Monocytes Relative: 10 %
Neutro Abs: 4 10*3/uL (ref 1.7–7.7)
Neutrophils Relative %: 64 %
Platelet Count: 75 10*3/uL — ABNORMAL LOW (ref 150–400)
RBC: 3.74 MIL/uL — ABNORMAL LOW (ref 4.22–5.81)
RDW: 14.5 % (ref 11.5–15.5)
WBC Count: 6.2 10*3/uL (ref 4.0–10.5)
nRBC: 0.5 % — ABNORMAL HIGH (ref 0.0–0.2)

## 2022-06-01 NOTE — Addendum Note (Signed)
Encounter addended by: Margaretmary Dys, MD on: 06/01/2022 8:53 AM  Actions taken: Clinical Note Signed

## 2022-06-07 ENCOUNTER — Telehealth: Payer: Self-pay | Admitting: *Deleted

## 2022-06-07 DIAGNOSIS — Z7901 Long term (current) use of anticoagulants: Secondary | ICD-10-CM | POA: Diagnosis not present

## 2022-06-07 NOTE — Telephone Encounter (Signed)
Called patient's daughter- Noelle Penner to remind of her dad's Xofigo Inj. For 06-08-22- arrival time- 11:45 am @ [facility], spoke with patient's daughter- Noelle Penner and she is aware of this inj.

## 2022-06-08 ENCOUNTER — Ambulatory Visit (HOSPITAL_COMMUNITY)
Admission: RE | Admit: 2022-06-08 | Discharge: 2022-06-08 | Disposition: A | Discharge status: Home / Self Care | Payer: Medicare Other | Source: Ambulatory Visit | Attending: Radiation Oncology | Admitting: Radiation Oncology

## 2022-06-08 DIAGNOSIS — C7951 Secondary malignant neoplasm of bone: Secondary | ICD-10-CM | POA: Insufficient documentation

## 2022-06-08 DIAGNOSIS — C61 Malignant neoplasm of prostate: Secondary | ICD-10-CM | POA: Insufficient documentation

## 2022-06-08 MED ORDER — RADIUM RA 223 DICHLORIDE 30 MCCI/ML IV SOLN
88.3000 | Freq: Once | INTRAVENOUS | Status: AC | PRN
  Administered 2022-06-08: 88.3 via INTRAVENOUS

## 2022-06-09 DIAGNOSIS — C7951 Secondary malignant neoplasm of bone: Secondary | ICD-10-CM | POA: Diagnosis not present

## 2022-06-09 DIAGNOSIS — C61 Malignant neoplasm of prostate: Secondary | ICD-10-CM | POA: Diagnosis not present

## 2022-06-10 DIAGNOSIS — Z7901 Long term (current) use of anticoagulants: Secondary | ICD-10-CM | POA: Diagnosis not present

## 2022-06-15 NOTE — Progress Notes (Signed)
  Radiation Oncology         [phone] ________________________________  Name: NAMON CRANSTON MRN: [id number]  Date: 06/08/2022  DOB: 25-Jun-1924  Radium-223 Infusion Note  Diagnosis:  Castration resistant prostate cancer with painful bone involvement  Current Infusion:    4  Planned Infusions:  6  Narrative: Mr. LATON GLASPER presented to nuclear medicine for treatment. His most recent blood counts were reviewed.  He remains a good candidate to proceed with Ra-223.  The patient was situated in an infusion suite with a contact barrier placed under his arm. Intravenous access was established, using sterile technique, and a normal saline infusion from a syringe was started.  Micro-dosimetry:  The prescribed radiation activity was assayed and confirmed to be within specified tolerance.  Special Treatment Procedure - Infusion:  The nuclear medicine technologist and I personally verified the dose activity to be delivered as specified in the written directive, and verified the patient identification via 2 separate methods.  The syringe containing the dose was attached to an intravenous access and the dose delivered over a minute. No complications were noted.  The total administered dose was 91.9 microcuries.   A saline flush of the line and the syringe that contained the isotope was then performed.  The residual radioactivity in the syringe was 3.6 microcuries, so the actual infused isotope activity was 88.3 microcuries.   Pressure was applied to the venipuncture site, and a compression bandage placed.   Radiation Safety personnel were present to perform the discharge survey, as detailed on their documentation.   After a short period of observation, the patient had his IV removed.  Impression:  The patient tolerated his infusion relatively well.  Plan:  The patient will return in one month for ongoing care.    ________________________________  Artist Pais. Kathrynn Running, M.D.

## 2022-06-23 ENCOUNTER — Telehealth: Payer: Self-pay | Admitting: *Deleted

## 2022-06-23 ENCOUNTER — Other Ambulatory Visit: Payer: Self-pay

## 2022-06-23 DIAGNOSIS — C61 Malignant neoplasm of prostate: Secondary | ICD-10-CM

## 2022-06-23 NOTE — Telephone Encounter (Signed)
CALLED PATIENT'S DAUGHTER- [name] TO INFORM OF LAB AND WEIGHT ON 07-01-22- 12 PM @ [facility] AND HIS XOFIGO INJ. ON 07-08-22- ARRIVAL TIME- 11:45 AM @ [facility], SPOKE WITH PATIENT'S DAUGHTER [name] AND SHE IS AWARE OF THESE APPTS.

## 2022-06-24 DIAGNOSIS — Z7901 Long term (current) use of anticoagulants: Secondary | ICD-10-CM | POA: Diagnosis not present

## 2022-06-29 DIAGNOSIS — Z7901 Long term (current) use of anticoagulants: Secondary | ICD-10-CM | POA: Diagnosis not present

## 2022-06-30 ENCOUNTER — Telehealth: Payer: Self-pay | Admitting: *Deleted

## 2022-06-30 DIAGNOSIS — C7951 Secondary malignant neoplasm of bone: Secondary | ICD-10-CM | POA: Diagnosis not present

## 2022-06-30 DIAGNOSIS — C61 Malignant neoplasm of prostate: Secondary | ICD-10-CM | POA: Diagnosis not present

## 2022-06-30 NOTE — Telephone Encounter (Signed)
CALLED PATIENT'S DAUGHTER- [name] TO REMIND OF HER DAD'S LAB AND WEIGHT APPT. FOR 07-01-22 @ 12 PM, LVM FOR A RETURN CALL

## 2022-07-01 ENCOUNTER — Other Ambulatory Visit: Payer: Self-pay

## 2022-07-01 ENCOUNTER — Ambulatory Visit
Admission: RE | Admit: 2022-07-01 | Discharge: 2022-07-01 | Disposition: A | Discharge status: Home / Self Care | Payer: Medicare Other | Source: Ambulatory Visit | Attending: Radiation Oncology | Admitting: Radiation Oncology

## 2022-07-01 DIAGNOSIS — C61 Malignant neoplasm of prostate: Secondary | ICD-10-CM | POA: Insufficient documentation

## 2022-07-01 DIAGNOSIS — C7951 Secondary malignant neoplasm of bone: Secondary | ICD-10-CM | POA: Diagnosis not present

## 2022-07-01 LAB — CBC WITH DIFFERENTIAL/PLATELET
Abs Immature Granulocytes: 0.56 10*3/uL — ABNORMAL HIGH (ref 0.00–0.07)
Basophils Absolute: 0 10*3/uL (ref 0.0–0.1)
Basophils Relative: 1 %
Eosinophils Absolute: 0.1 10*3/uL (ref 0.0–0.5)
Eosinophils Relative: 1 %
HCT: 26.1 % — ABNORMAL LOW (ref 39.0–52.0)
Hemoglobin: 8.5 g/dL — ABNORMAL LOW (ref 13.0–17.0)
Immature Granulocytes: 11 %
Lymphocytes Relative: 22 %
Lymphs Abs: 1.1 10*3/uL (ref 0.7–4.0)
MCH: 27.7 pg (ref 26.0–34.0)
MCHC: 32.6 g/dL (ref 30.0–36.0)
MCV: 85 fL (ref 80.0–100.0)
Monocytes Absolute: 0.5 10*3/uL (ref 0.1–1.0)
Monocytes Relative: 10 %
Neutro Abs: 2.7 10*3/uL (ref 1.7–7.7)
Neutrophils Relative %: 55 %
Platelets: 59 10*3/uL — ABNORMAL LOW (ref 150–400)
RBC: 3.07 MIL/uL — ABNORMAL LOW (ref 4.22–5.81)
RDW: 15 % (ref 11.5–15.5)
WBC: 4.9 10*3/uL (ref 4.0–10.5)
nRBC: 8.6 % — ABNORMAL HIGH (ref 0.0–0.2)

## 2022-07-01 LAB — COMPREHENSIVE METABOLIC PANEL
ALT: 27 U/L (ref 0–44)
AST: 69 U/L — ABNORMAL HIGH (ref 15–41)
Albumin: 3.9 g/dL (ref 3.5–5.0)
Alkaline Phosphatase: 45 U/L (ref 38–126)
Anion gap: 8 (ref 5–15)
BUN: 22 mg/dL (ref 8–23)
CO2: 22 mmol/L (ref 22–32)
Calcium: 8.9 mg/dL (ref 8.9–10.3)
Chloride: 112 mmol/L — ABNORMAL HIGH (ref 98–111)
Creatinine, Ser: 1.26 mg/dL — ABNORMAL HIGH (ref 0.61–1.24)
GFR, Estimated: 52 mL/min — ABNORMAL LOW (ref >60)
Glucose, Bld: 101 mg/dL — ABNORMAL HIGH (ref 70–99)
Potassium: 4.1 mmol/L (ref 3.5–5.1)
Sodium: 142 mmol/L (ref 135–145)
Total Bilirubin: 0.8 mg/dL (ref 0.3–1.2)
Total Protein: 6.7 g/dL (ref 6.5–8.1)

## 2022-07-03 DIAGNOSIS — C7951 Secondary malignant neoplasm of bone: Secondary | ICD-10-CM | POA: Diagnosis not present

## 2022-07-03 DIAGNOSIS — E440 Moderate protein-calorie malnutrition: Secondary | ICD-10-CM | POA: Diagnosis not present

## 2022-07-03 DIAGNOSIS — R7303 Prediabetes: Secondary | ICD-10-CM | POA: Diagnosis not present

## 2022-07-03 DIAGNOSIS — D630 Anemia in neoplastic disease: Secondary | ICD-10-CM | POA: Diagnosis not present

## 2022-07-03 DIAGNOSIS — J45909 Unspecified asthma, uncomplicated: Secondary | ICD-10-CM | POA: Diagnosis not present

## 2022-07-03 DIAGNOSIS — D631 Anemia in chronic kidney disease: Secondary | ICD-10-CM | POA: Diagnosis not present

## 2022-07-03 DIAGNOSIS — R498 Other voice and resonance disorders: Secondary | ICD-10-CM | POA: Diagnosis not present

## 2022-07-03 DIAGNOSIS — I700 Atherosclerosis of aorta: Secondary | ICD-10-CM | POA: Diagnosis not present

## 2022-07-03 DIAGNOSIS — R4701 Aphasia: Secondary | ICD-10-CM | POA: Diagnosis not present

## 2022-07-03 DIAGNOSIS — Z87891 Personal history of nicotine dependence: Secondary | ICD-10-CM | POA: Diagnosis not present

## 2022-07-03 DIAGNOSIS — Z8673 Personal history of transient ischemic attack (TIA), and cerebral infarction without residual deficits: Secondary | ICD-10-CM | POA: Diagnosis not present

## 2022-07-03 DIAGNOSIS — I129 Hypertensive chronic kidney disease with stage 1 through stage 4 chronic kidney disease, or unspecified chronic kidney disease: Secondary | ICD-10-CM | POA: Diagnosis not present

## 2022-07-03 DIAGNOSIS — I825Z1 Chronic embolism and thrombosis of unspecified deep veins of right distal lower extremity: Secondary | ICD-10-CM | POA: Diagnosis not present

## 2022-07-03 DIAGNOSIS — D696 Thrombocytopenia, unspecified: Secondary | ICD-10-CM | POA: Diagnosis not present

## 2022-07-03 DIAGNOSIS — G4700 Insomnia, unspecified: Secondary | ICD-10-CM | POA: Diagnosis not present

## 2022-07-03 DIAGNOSIS — M179 Osteoarthritis of knee, unspecified: Secondary | ICD-10-CM | POA: Diagnosis not present

## 2022-07-03 DIAGNOSIS — K219 Gastro-esophageal reflux disease without esophagitis: Secondary | ICD-10-CM | POA: Diagnosis not present

## 2022-07-03 DIAGNOSIS — C61 Malignant neoplasm of prostate: Secondary | ICD-10-CM | POA: Diagnosis not present

## 2022-07-03 DIAGNOSIS — Z7901 Long term (current) use of anticoagulants: Secondary | ICD-10-CM | POA: Diagnosis not present

## 2022-07-03 DIAGNOSIS — N1831 Chronic kidney disease, stage 3a: Secondary | ICD-10-CM | POA: Diagnosis not present

## 2022-07-03 DIAGNOSIS — E7800 Pure hypercholesterolemia, unspecified: Secondary | ICD-10-CM | POA: Diagnosis not present

## 2022-07-03 DIAGNOSIS — Z79899 Other long term (current) drug therapy: Secondary | ICD-10-CM | POA: Diagnosis not present

## 2022-07-04 DIAGNOSIS — C61 Malignant neoplasm of prostate: Secondary | ICD-10-CM | POA: Diagnosis not present

## 2022-07-04 DIAGNOSIS — C7951 Secondary malignant neoplasm of bone: Secondary | ICD-10-CM | POA: Diagnosis not present

## 2022-07-04 DIAGNOSIS — I129 Hypertensive chronic kidney disease with stage 1 through stage 4 chronic kidney disease, or unspecified chronic kidney disease: Secondary | ICD-10-CM | POA: Diagnosis not present

## 2022-07-04 DIAGNOSIS — D631 Anemia in chronic kidney disease: Secondary | ICD-10-CM | POA: Diagnosis not present

## 2022-07-04 DIAGNOSIS — D630 Anemia in neoplastic disease: Secondary | ICD-10-CM | POA: Diagnosis not present

## 2022-07-04 DIAGNOSIS — N1831 Chronic kidney disease, stage 3a: Secondary | ICD-10-CM | POA: Diagnosis not present

## 2022-07-05 ENCOUNTER — Telehealth: Payer: Self-pay | Admitting: Hematology and Oncology

## 2022-07-05 ENCOUNTER — Other Ambulatory Visit: Payer: Self-pay | Admitting: Hematology and Oncology

## 2022-07-05 ENCOUNTER — Telehealth: Payer: Self-pay | Admitting: *Deleted

## 2022-07-05 DIAGNOSIS — C61 Malignant neoplasm of prostate: Secondary | ICD-10-CM

## 2022-07-05 NOTE — Telephone Encounter (Signed)
CALLED PATIENT'S DAUGHTER- [name] TO INFORM THAT HER DAD'S XOFIGO INJECTION HAS BEEN CANCELLED FOR 07-08-22, DUE TO HEMOGLOBIN BEING TOO LOW, SPOKE WITH MS. HINSON AND SHE IS AWARE OF THIS INJECTION BEING CANCELLED

## 2022-07-05 NOTE — Telephone Encounter (Signed)
Spoke with patient daughter confirming upcoming appointment 

## 2022-07-05 NOTE — Progress Notes (Signed)
PSMA pet ordered.  Craig Freeman

## 2022-07-06 ENCOUNTER — Telehealth: Payer: Self-pay

## 2022-07-06 DIAGNOSIS — D630 Anemia in neoplastic disease: Secondary | ICD-10-CM | POA: Diagnosis not present

## 2022-07-06 DIAGNOSIS — N1831 Chronic kidney disease, stage 3a: Secondary | ICD-10-CM | POA: Diagnosis not present

## 2022-07-06 DIAGNOSIS — I129 Hypertensive chronic kidney disease with stage 1 through stage 4 chronic kidney disease, or unspecified chronic kidney disease: Secondary | ICD-10-CM | POA: Diagnosis not present

## 2022-07-06 DIAGNOSIS — D631 Anemia in chronic kidney disease: Secondary | ICD-10-CM | POA: Diagnosis not present

## 2022-07-06 DIAGNOSIS — C61 Malignant neoplasm of prostate: Secondary | ICD-10-CM | POA: Diagnosis not present

## 2022-07-06 DIAGNOSIS — C7951 Secondary malignant neoplasm of bone: Secondary | ICD-10-CM | POA: Diagnosis not present

## 2022-07-06 NOTE — Telephone Encounter (Signed)
Pt's radiation was cancelled d/t hgb and platelets. MD still wants to see pt 6/18 to discuss. He will have as PET scan 6/26. Pt's daughter is aware and knows to call with any questions in the interim.

## 2022-07-07 DIAGNOSIS — D631 Anemia in chronic kidney disease: Secondary | ICD-10-CM | POA: Diagnosis not present

## 2022-07-07 DIAGNOSIS — D630 Anemia in neoplastic disease: Secondary | ICD-10-CM | POA: Diagnosis not present

## 2022-07-07 DIAGNOSIS — C61 Malignant neoplasm of prostate: Secondary | ICD-10-CM | POA: Diagnosis not present

## 2022-07-07 DIAGNOSIS — N1831 Chronic kidney disease, stage 3a: Secondary | ICD-10-CM | POA: Diagnosis not present

## 2022-07-07 DIAGNOSIS — I129 Hypertensive chronic kidney disease with stage 1 through stage 4 chronic kidney disease, or unspecified chronic kidney disease: Secondary | ICD-10-CM | POA: Diagnosis not present

## 2022-07-07 DIAGNOSIS — C7951 Secondary malignant neoplasm of bone: Secondary | ICD-10-CM | POA: Diagnosis not present

## 2022-07-08 ENCOUNTER — Ambulatory Visit (HOSPITAL_COMMUNITY): Payer: Medicare Other

## 2022-07-09 DIAGNOSIS — D630 Anemia in neoplastic disease: Secondary | ICD-10-CM | POA: Diagnosis not present

## 2022-07-09 DIAGNOSIS — N1831 Chronic kidney disease, stage 3a: Secondary | ICD-10-CM | POA: Diagnosis not present

## 2022-07-09 DIAGNOSIS — I129 Hypertensive chronic kidney disease with stage 1 through stage 4 chronic kidney disease, or unspecified chronic kidney disease: Secondary | ICD-10-CM | POA: Diagnosis not present

## 2022-07-09 DIAGNOSIS — C61 Malignant neoplasm of prostate: Secondary | ICD-10-CM | POA: Diagnosis not present

## 2022-07-09 DIAGNOSIS — C7951 Secondary malignant neoplasm of bone: Secondary | ICD-10-CM | POA: Diagnosis not present

## 2022-07-09 DIAGNOSIS — D631 Anemia in chronic kidney disease: Secondary | ICD-10-CM | POA: Diagnosis not present

## 2022-07-12 DIAGNOSIS — D630 Anemia in neoplastic disease: Secondary | ICD-10-CM | POA: Diagnosis not present

## 2022-07-12 DIAGNOSIS — D631 Anemia in chronic kidney disease: Secondary | ICD-10-CM | POA: Diagnosis not present

## 2022-07-12 DIAGNOSIS — C61 Malignant neoplasm of prostate: Secondary | ICD-10-CM | POA: Diagnosis not present

## 2022-07-12 DIAGNOSIS — I129 Hypertensive chronic kidney disease with stage 1 through stage 4 chronic kidney disease, or unspecified chronic kidney disease: Secondary | ICD-10-CM | POA: Diagnosis not present

## 2022-07-12 DIAGNOSIS — C7951 Secondary malignant neoplasm of bone: Secondary | ICD-10-CM | POA: Diagnosis not present

## 2022-07-12 DIAGNOSIS — N1831 Chronic kidney disease, stage 3a: Secondary | ICD-10-CM | POA: Diagnosis not present

## 2022-07-13 ENCOUNTER — Telehealth: Payer: Self-pay

## 2022-07-13 ENCOUNTER — Other Ambulatory Visit: Payer: Self-pay

## 2022-07-13 ENCOUNTER — Inpatient Hospital Stay: Payer: Medicare Other | Attending: Hematology and Oncology | Admitting: Hematology and Oncology

## 2022-07-13 ENCOUNTER — Inpatient Hospital Stay: Payer: Medicare Other

## 2022-07-13 VITALS — BP 117/67 | HR 62 | Temp 97.5°F | Resp 15 | Wt 133.0 lb

## 2022-07-13 DIAGNOSIS — C61 Malignant neoplasm of prostate: Secondary | ICD-10-CM | POA: Diagnosis not present

## 2022-07-13 DIAGNOSIS — Z79899 Other long term (current) drug therapy: Secondary | ICD-10-CM | POA: Insufficient documentation

## 2022-07-13 DIAGNOSIS — I10 Essential (primary) hypertension: Secondary | ICD-10-CM | POA: Diagnosis not present

## 2022-07-13 DIAGNOSIS — Z7901 Long term (current) use of anticoagulants: Secondary | ICD-10-CM | POA: Diagnosis not present

## 2022-07-13 DIAGNOSIS — D696 Thrombocytopenia, unspecified: Secondary | ICD-10-CM | POA: Diagnosis not present

## 2022-07-13 DIAGNOSIS — C7951 Secondary malignant neoplasm of bone: Secondary | ICD-10-CM | POA: Insufficient documentation

## 2022-07-13 DIAGNOSIS — I700 Atherosclerosis of aorta: Secondary | ICD-10-CM | POA: Diagnosis not present

## 2022-07-13 DIAGNOSIS — R7303 Prediabetes: Secondary | ICD-10-CM | POA: Diagnosis not present

## 2022-07-13 DIAGNOSIS — Z881 Allergy status to other antibiotic agents status: Secondary | ICD-10-CM | POA: Insufficient documentation

## 2022-07-13 DIAGNOSIS — E785 Hyperlipidemia, unspecified: Secondary | ICD-10-CM | POA: Insufficient documentation

## 2022-07-13 DIAGNOSIS — D649 Anemia, unspecified: Secondary | ICD-10-CM | POA: Diagnosis not present

## 2022-07-13 DIAGNOSIS — Z86718 Personal history of other venous thrombosis and embolism: Secondary | ICD-10-CM | POA: Insufficient documentation

## 2022-07-13 DIAGNOSIS — N1831 Chronic kidney disease, stage 3a: Secondary | ICD-10-CM | POA: Diagnosis not present

## 2022-07-13 DIAGNOSIS — Z880 Allergy status to penicillin: Secondary | ICD-10-CM | POA: Insufficient documentation

## 2022-07-13 DIAGNOSIS — E7800 Pure hypercholesterolemia, unspecified: Secondary | ICD-10-CM | POA: Diagnosis not present

## 2022-07-13 DIAGNOSIS — I825Z1 Chronic embolism and thrombosis of unspecified deep veins of right distal lower extremity: Secondary | ICD-10-CM | POA: Diagnosis not present

## 2022-07-13 DIAGNOSIS — J45909 Unspecified asthma, uncomplicated: Secondary | ICD-10-CM | POA: Diagnosis not present

## 2022-07-13 DIAGNOSIS — Z0000 Encounter for general adult medical examination without abnormal findings: Secondary | ICD-10-CM | POA: Diagnosis not present

## 2022-07-13 LAB — CMP (CANCER CENTER ONLY)
ALT: 19 U/L (ref 0–44)
AST: 53 U/L — ABNORMAL HIGH (ref 15–41)
Albumin: 3.7 g/dL (ref 3.5–5.0)
Alkaline Phosphatase: 52 U/L (ref 38–126)
Anion gap: 10 (ref 5–15)
BUN: 17 mg/dL (ref 8–23)
CO2: 22 mmol/L (ref 22–32)
Calcium: 9.1 mg/dL (ref 8.9–10.3)
Chloride: 108 mmol/L (ref 98–111)
Creatinine: 1.16 mg/dL (ref 0.61–1.24)
GFR, Estimated: 58 mL/min — ABNORMAL LOW (ref >60)
Glucose, Bld: 100 mg/dL — ABNORMAL HIGH (ref 70–99)
Potassium: 3.8 mmol/L (ref 3.5–5.1)
Sodium: 140 mmol/L (ref 135–145)
Total Bilirubin: 0.8 mg/dL (ref 0.3–1.2)
Total Protein: 6.1 g/dL — ABNORMAL LOW (ref 6.5–8.1)

## 2022-07-13 LAB — CBC WITH DIFFERENTIAL/PLATELET
Abs Immature Granulocytes: 0.43 10*3/uL — ABNORMAL HIGH (ref 0.00–0.07)
Basophils Absolute: 0 10*3/uL (ref 0.0–0.1)
Basophils Relative: 1 %
Eosinophils Absolute: 0 10*3/uL (ref 0.0–0.5)
Eosinophils Relative: 1 %
HCT: 23.4 % — ABNORMAL LOW (ref 39.0–52.0)
Hemoglobin: 7.5 g/dL — ABNORMAL LOW (ref 13.0–17.0)
Immature Granulocytes: 9 %
Lymphocytes Relative: 28 %
Lymphs Abs: 1.3 10*3/uL (ref 0.7–4.0)
MCH: 28.1 pg (ref 26.0–34.0)
MCHC: 32.1 g/dL (ref 30.0–36.0)
MCV: 87.6 fL (ref 80.0–100.0)
Monocytes Absolute: 0.5 10*3/uL (ref 0.1–1.0)
Monocytes Relative: 11 %
Neutro Abs: 2.4 10*3/uL (ref 1.7–7.7)
Neutrophils Relative %: 50 %
Platelets: 66 10*3/uL — ABNORMAL LOW (ref 150–400)
RBC: 2.67 MIL/uL — ABNORMAL LOW (ref 4.22–5.81)
RDW: 15.7 % — ABNORMAL HIGH (ref 11.5–15.5)
Smear Review: NORMAL
WBC: 4.7 10*3/uL (ref 4.0–10.5)
nRBC: 24.4 % — ABNORMAL HIGH (ref 0.0–0.2)

## 2022-07-13 NOTE — Progress Notes (Signed)
[facility] CONSULT NOTE  Patient Care Team: Georgann Housekeeper, MD as PCP - General (Internal Medicine) Cherlyn Cushing, RN as Oncology Nurse Navigator Jerilee Field, MD as Consulting Physician (Urology)  CHIEF COMPLAINTS/PURPOSE OF CONSULTATION:  Metastatic prostate cancer  SUMMARY OF ONCOLOGIC HISTORY: Oncology History  Prostate cancer metastatic to bone [facility])  10/28/2020 Cancer Staging   Staging form: Prostate, AJCC 8th Edition - Clinical stage from 10/28/2020: Stage IVB (cT2c, cN0, cM1b, PSA: 331, Grade Group: 5) - Signed by Marcello Fennel, PA-C on 02/12/2022 Histopathologic type: Adenocarcinoma, NOS Stage prefix: Initial diagnosis Prostate specific antigen (PSA) range: 20 or greater Gleason primary pattern: 4 Gleason secondary pattern: 5 Gleason score: 9 Histologic grading system: 5 grade system Number of biopsy cores examined: 6 Number of biopsy cores positive: 6 Location of positive needle core biopsies: Both sides   12/30/2021 Initial Diagnosis   Prostate cancer metastatic to bone [facility])    Chronology  Patient was diagnosed with metastatic prostate cancer in October 2022 with PSA of 331 at baseline, T2/T3 disease on digital rectal exam, prostate being 115 g.  He had CT imaging with contrast at that time which showed sclerotic lesions and bony pelvis as well as L2 vertebral body.  No lymphadenopathy or soft tissue metastasis.  Started ADT in October 2022 and now back in June 2023.  His PSA nadir was 2.76 in April 2023 but rose to 4 in July 2023.   He also started on Xgeva in Jan 2023.  His PSA has risen to 24.5 in November 2023.  He is very active 89 year old, goes to the gym every day walks 5 times a week.  He denies any complaints, no new bone pains, change in breathing or bowel habits.  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  Interval history  Ms. Kot  is here for follow-up with her daughter.  He currently continues on Xofigo but after his most recent labs given concern for progression of disease we have brought him back for an appointment as well as ordered a PSMA PET which is scheduled for next week.  He denies any complaints.  He is not very good historian.  His speech was a bit hard to comprehend today.  According to the daughter he has been sitting more, has not been able to clean the house and has been feeling very weak in the past 2 weeks.   Rest of the pertinent 10 point ROS reviewed and negative.  MEDICAL HISTORY:  Past Medical History:  Diagnosis Date   Cancer (HCC)    Constipation    DVT (deep venous thrombosis), right    coumadin   High cholesterol    Hyperlipidemia    Hypertension     SURGICAL HISTORY: Past Surgical History:  Procedure Laterality Date   JOINT REPLACEMENT      SOCIAL HISTORY: Social History   Socioeconomic History   Marital status: Married    Spouse name: Not on file   Number of children: Not on file   Years of education: Not on file   Highest education level: Not on file  Occupational History   Not on file  Tobacco Use   Smoking status: Never   Smokeless tobacco: Never  Substance and Sexual Activity   Alcohol use: No   Drug use: No   Sexual activity: Not Currently  Other Topics Concern   Not on file  Social History Narrative  Not on file   Social Determinants of Health   Financial Resource Strain: Not on file  Food Insecurity: No Food Insecurity (02/12/2022)   Hunger Vital Sign    Worried About Running Out of Food in the Last Year: Never true    Ran Out of Food in the Last Year: Never true  Transportation Needs: No Transportation Needs (02/12/2022)   PRAPARE - Administrator, Civil Service (Medical): No    Lack of Transportation (Non-Medical): No  Physical Activity: Not on file  Stress: Not on file  Social Connections: Not on file  Intimate Partner Violence: Not At  Risk (02/12/2022)   Humiliation, Afraid, Rape, and Kick questionnaire    Fear of Current or Ex-Partner: No    Emotionally Abused: No    Physically Abused: No    Sexually Abused: No    FAMILY HISTORY: No family history on file.  ALLERGIES:  is allergic to amoxicillin, augmentin [amoxicillin-pot clavulanate], and zithromax [azithromycin].  MEDICATIONS:  Current Outpatient Medications  Medication Sig Dispense Refill   albuterol (PROVENTIL HFA;VENTOLIN HFA) 108 (90 BASE) MCG/ACT inhaler Inhale 1-2 puffs into the lungs every 6 (six) hours as needed for wheezing or shortness of breath. Shortness of breath     atenolol (TENORMIN) 50 MG tablet Take 50 mg by mouth daily.     atorvastatin (LIPITOR) 40 MG tablet Take 40 mg by mouth daily.     Calcium Carb-Cholecalciferol (CALCIUM 600+D3 PO) Take 1 tablet by mouth 2 (two) times daily with a meal.     [employer] MELATONIN 5 MG TABS Take 5 mg by mouth at bedtime as needed (for sleep).     denosumab (XGEVA) 120 MG/1.7ML SOLN injection Inject 120 mg into the skin every 30 (thirty) days.     ferrous sulfate 325 (65 FE) MG tablet Take 325 mg by mouth 2 (two) times daily with a meal.     fluticasone (FLONASE) 50 MCG/ACT nasal spray Place 2 sprays into both nostrils daily.     levocetirizine (XYZAL) 5 MG tablet Take 5 mg by mouth every evening.     losartan (COZAAR) 50 MG tablet Take 50 mg by mouth daily.     megestrol (MEGACE) 20 MG tablet Take 20 mg by mouth at bedtime as needed (for hot flashes).     NUBEQA 300 MG tablet Take 600 mg by mouth 2 (two) times daily with a meal.     ondansetron (ZOFRAN) 4 MG tablet Take 1 tablet (4 mg total) by mouth every 6 (six) hours as needed for nausea. 30 tablet 0   polyethylene glycol (MIRALAX) 17 g packet Take 17 g by mouth daily as needed for mild constipation or moderate constipation (also available OTC.). 14 each 0   SYSTANE COMPLETE PF 0.6 % SOLN Place 1 drop into both eyes in the morning.     triazolam (HALCION) 0.125  MG tablet Take 0.125 mg by mouth at bedtime as needed for sleep.     warfarin (COUMADIN) 5 MG tablet Take 0.5-1 tablets (2.5-5 mg total) by mouth See admin instructions. Take 2.5 mg by mouth in the morning on Sun/Tues/Thurs and 5 mg on Mon/Wed/Fri/Sat     WIXELA INHUB 100-50 MCG/ACT AEPB Inhale 1 puff into the lungs 2 (two) times daily.     No current facility-administered medications for this visit.    REVIEW OF SYSTEMS:   Constitutional: Denies fevers, chills or abnormal night sweats Eyes: Denies blurriness of vision, double vision or watery eyes  Ears, nose, mouth, throat, and face: Denies mucositis or sore throat Respiratory: Denies cough, dyspnea or wheezes Cardiovascular: Denies palpitation, chest discomfort or lower extremity swelling Gastrointestinal:  Denies nausea, heartburn or change in bowel habits Skin: Denies abnormal skin rashes Lymphatics: Denies new lymphadenopathy or easy bruising Neurological:Denies numbness, tingling or new weaknesses Behavioral/Psych: Mood is stable, no new changes All other systems were reviewed with the patient and are negative.  PHYSICAL EXAMINATION: ECOG PERFORMANCE STATUS: 0 - Asymptomatic  Vitals:   07/13/22 1412  BP: 117/67  Pulse: 62  Resp: 15  Temp: (!) 97.5 F (36.4 C)  SpO2: 99%   Filed Weights   07/13/22 1412  Weight: 133 lb (60.3 kg)    GENERAL: He appears weak, no acute distress but smelled of urine SKIN: skin color, texture, turgor are normal, no rashes or significant lesions EYES: normal, conjunctiva are pink and non-injected, sclera clear OROPHARYNX:no exudate, no erythema and lips, buccal mucosa, and tongue normal  NECK: supple, thyroid normal size, non-tender, without nodularity LYMPH:  no palpable lymphadenopathy in the cervical,  LUNGS: clear to auscultation and percussion with normal breathing effort HEART: regular rate & rhythm and no murmurs and no lower extremity edema ABDOMEN:abdomen soft, non-tender and normal  bowel sounds Musculoskeletal:no cyanosis of digits and no clubbing  PSYCH: alert & oriented x 3 with fluent speech NEURO: no focal motor/sensory deficits   LABORATORY DATA:  I have reviewed the data as listed Lab Results  Component Value Date   WBC 4.7 07/13/2022   HGB 7.5 (L) 07/13/2022   HCT 23.4 (L) 07/13/2022   MCV 87.6 07/13/2022   PLT 66 (L) 07/13/2022   Lab Results  Component Value Date   NA 142 07/01/2022   K 4.1 07/01/2022   CL 112 (H) 07/01/2022   CO2 22 07/01/2022    RADIOGRAPHIC STUDIES: I have personally reviewed the radiological reports and agreed with the findings in the report.  ASSESSMENT AND PLAN:  Prostate cancer metastatic to bone [facility]) This is a very pleasant 89 year old male patient with newly diagnosed castrate resistant metastatic prostate cancer referred to medical oncology for recommendations.  Patient was diagnosed with metastatic prostate cancer back in October 2022 with a PSA of 331 at baseline currently on ADT and Nubeqa.  He was not on full dose of Nubeqa due to funding and he most recently was titrated to full dose in August or September 2023.  His PSA back in November was 24.5 hence he was referred to medical oncology for additional recommendations.  During his last visit we have discussed about proceeding with PSMA PET, repeating PSA and returning to clinic.   PSMA PET confirmed multiple intense skeletal metastasis, no evidence of visceral metastasis. Given skeletal only mets, we have advised proceeding with Xofigo. He is currently on Xofigo.  His last PSA was remarkably elevated but he was unsure if it was related to treatment versus disease progression.  His most recent labs however showed severe cytopenias hence upon discussion with Dr. Kathrynn Running he recommended that we proceed with further investigation to see if he has metastatic disease which is worsening.  PSMA PET scheduled for next week.  He currently today feels very weak, has not been able  to do much in the past 2 weeks, his daughter and son are helping cleaning his home and preparing his food. He has been sitting pretty much most of the day.  On physical exam today, he appeared very weak, hard for him to move from the  chair to the table, he smelled of urine.  No palpable lymphadenopathy no hepatosplenomegaly no lower extremity edema.  We have reviewed treatment options including considering Zytiga versus palliative care and comfort care and I have discussed about chemo but I do not believe he will be a good candidate for chemotherapy unless he becomes stronger.  I have discussed with the family that palliative care and hospice is not unreasonable if he continues to deteriorate physically and becomes weak.  They will think about it. I will call after the PSMA PET results.  If they wish to proceed with treatment, I could consider Zytiga for next line of treatment.  Rachel Moulds MD  . Total time spent 30 minutes including history, physical exam, review of records, counseling and coordination of care All questions were answered. The patient knows to call the clinic with any problems, questions or concerns.    Rachel Moulds, MD 07/13/22

## 2022-07-13 NOTE — Assessment & Plan Note (Addendum)
This is a very pleasant 89 year old male patient with newly diagnosed castrate resistant metastatic prostate cancer referred to medical oncology for recommendations.  Patient was diagnosed with metastatic prostate cancer back in October 2022 with a PSA of 331 at baseline currently on ADT and Nubeqa.  He was not on full dose of Nubeqa due to funding and he most recently was titrated to full dose in August or September 2023.  His PSA back in November was 24.5 hence he was referred to medical oncology for additional recommendations.  During his last visit we have discussed about proceeding with PSMA PET, repeating PSA and returning to clinic.   PSMA PET confirmed multiple intense skeletal metastasis, no evidence of visceral metastasis. Given skeletal only mets, we have advised proceeding with Xofigo. He is currently on Xofigo.  His last PSA was remarkably elevated but he was unsure if it was related to treatment versus disease progression.  His most recent labs however showed severe cytopenias hence upon discussion with Dr. Kathrynn Running he recommended that we proceed with further investigation to see if he has metastatic disease which is worsening.  PSMA PET scheduled for next week.  He currently today feels very weak, has not been able to do much in the past 2 weeks, his daughter and son are helping cleaning his home and preparing his food. He has been sitting pretty much most of the day.  On physical exam today, he appeared very weak, hard for him to move from the chair to the table, he smelled of urine.  No palpable lymphadenopathy no hepatosplenomegaly no lower extremity edema.  We have reviewed treatment options including considering Zytiga versus palliative care and comfort care and I have discussed about chemo but I do not believe he will be a good candidate for chemotherapy unless he becomes stronger.  I have discussed with the family that palliative care and hospice is not unreasonable if he continues to  deteriorate physically and becomes weak.  They will think about it. I will call after the PSMA PET results.  If they wish to proceed with treatment, I could consider Zytiga for next line of treatment.  Rachel Moulds MD  .

## 2022-07-13 NOTE — Telephone Encounter (Signed)
Called Daughter and given below message. Daughter agreeable to transfusion appt with [facility] on 06/20. Lab appt made for 0800.

## 2022-07-13 NOTE — Telephone Encounter (Signed)
-----   Message from Rachel Moulds, MD sent at 07/13/2022  4:03 PM EDT ----- [name]  His hb is 7.5 today, he is feeling very weak and he is 89, so I think its reasonable to give him some blood 1 unit PRBC if they can come this week.  Thanks

## 2022-07-14 ENCOUNTER — Telehealth: Payer: Self-pay | Admitting: *Deleted

## 2022-07-14 ENCOUNTER — Other Ambulatory Visit: Payer: Self-pay | Admitting: *Deleted

## 2022-07-14 ENCOUNTER — Other Ambulatory Visit: Payer: Self-pay

## 2022-07-14 ENCOUNTER — Telehealth: Payer: Self-pay | Admitting: Hematology and Oncology

## 2022-07-14 DIAGNOSIS — D649 Anemia, unspecified: Secondary | ICD-10-CM

## 2022-07-14 DIAGNOSIS — C61 Malignant neoplasm of prostate: Secondary | ICD-10-CM

## 2022-07-14 NOTE — Telephone Encounter (Signed)
Spoke with patient daughter confirming upcoming appointment 

## 2022-07-15 ENCOUNTER — Inpatient Hospital Stay: Payer: Medicare Other

## 2022-07-15 ENCOUNTER — Other Ambulatory Visit: Payer: Self-pay

## 2022-07-15 DIAGNOSIS — C61 Malignant neoplasm of prostate: Secondary | ICD-10-CM

## 2022-07-15 DIAGNOSIS — E785 Hyperlipidemia, unspecified: Secondary | ICD-10-CM | POA: Diagnosis not present

## 2022-07-15 DIAGNOSIS — Z86718 Personal history of other venous thrombosis and embolism: Secondary | ICD-10-CM | POA: Diagnosis not present

## 2022-07-15 DIAGNOSIS — D649 Anemia, unspecified: Secondary | ICD-10-CM

## 2022-07-15 DIAGNOSIS — Z79899 Other long term (current) drug therapy: Secondary | ICD-10-CM | POA: Diagnosis not present

## 2022-07-15 DIAGNOSIS — Z7901 Long term (current) use of anticoagulants: Secondary | ICD-10-CM | POA: Diagnosis not present

## 2022-07-15 DIAGNOSIS — C7951 Secondary malignant neoplasm of bone: Secondary | ICD-10-CM | POA: Diagnosis not present

## 2022-07-15 LAB — CBC WITH DIFFERENTIAL/PLATELET
Abs Immature Granulocytes: 0.5 10*3/uL — ABNORMAL HIGH (ref 0.00–0.07)
Basophils Absolute: 0 10*3/uL (ref 0.0–0.1)
Basophils Relative: 0 %
Eosinophils Absolute: 0.1 10*3/uL (ref 0.0–0.5)
Eosinophils Relative: 1 %
HCT: 24.3 % — ABNORMAL LOW (ref 39.0–52.0)
Hemoglobin: 7.8 g/dL — ABNORMAL LOW (ref 13.0–17.0)
Lymphocytes Relative: 21 %
Lymphs Abs: 1.1 10*3/uL (ref 0.7–4.0)
MCH: 28.1 pg (ref 26.0–34.0)
MCHC: 32.1 g/dL (ref 30.0–36.0)
MCV: 87.4 fL (ref 80.0–100.0)
Metamyelocytes Relative: 3 %
Monocytes Absolute: 0.2 10*3/uL (ref 0.1–1.0)
Monocytes Relative: 4 %
Myelocytes: 7 %
Neutro Abs: 3.5 10*3/uL (ref 1.7–7.7)
Neutrophils Relative %: 64 %
Platelets: 57 10*3/uL — ABNORMAL LOW (ref 150–400)
RBC: 2.78 MIL/uL — ABNORMAL LOW (ref 4.22–5.81)
RDW: 15.8 % — ABNORMAL HIGH (ref 11.5–15.5)
Smear Review: NORMAL
WBC: 5.4 10*3/uL (ref 4.0–10.5)
nRBC: 22.1 % — ABNORMAL HIGH (ref 0.0–0.2)

## 2022-07-15 LAB — TYPE AND SCREEN

## 2022-07-15 LAB — SAMPLE TO BLOOD BANK

## 2022-07-15 LAB — BPAM RBC: Blood Product Expiration Date: 202406272359

## 2022-07-15 LAB — PREPARE RBC (CROSSMATCH)

## 2022-07-15 MED ORDER — ACETAMINOPHEN 325 MG PO TABS
650.0000 mg | ORAL_TABLET | Freq: Once | ORAL | Status: AC
  Administered 2022-07-15: 650 mg via ORAL
  Filled 2022-07-15: qty 2

## 2022-07-15 MED ORDER — SODIUM CHLORIDE 0.9% IV SOLUTION
250.0000 mL | Freq: Once | INTRAVENOUS | Status: AC
  Administered 2022-07-15: 250 mL via INTRAVENOUS

## 2022-07-15 NOTE — Patient Instructions (Signed)

## 2022-07-16 ENCOUNTER — Other Ambulatory Visit: Payer: Self-pay

## 2022-07-16 ENCOUNTER — Encounter (HOSPITAL_COMMUNITY): Payer: Self-pay | Admitting: Family Medicine

## 2022-07-16 ENCOUNTER — Observation Stay (HOSPITAL_COMMUNITY)
Admission: EM | Admit: 2022-07-16 | Discharge: 2022-07-18 | Disposition: A | Discharge status: Home Health | Payer: Medicare Other | Attending: Family Medicine | Admitting: Family Medicine

## 2022-07-16 DIAGNOSIS — Z859 Personal history of malignant neoplasm, unspecified: Secondary | ICD-10-CM | POA: Insufficient documentation

## 2022-07-16 DIAGNOSIS — Z79899 Other long term (current) drug therapy: Secondary | ICD-10-CM | POA: Insufficient documentation

## 2022-07-16 DIAGNOSIS — Z7901 Long term (current) use of anticoagulants: Secondary | ICD-10-CM | POA: Diagnosis not present

## 2022-07-16 DIAGNOSIS — D696 Thrombocytopenia, unspecified: Secondary | ICD-10-CM | POA: Diagnosis not present

## 2022-07-16 DIAGNOSIS — Z96659 Presence of unspecified artificial knee joint: Secondary | ICD-10-CM | POA: Diagnosis not present

## 2022-07-16 DIAGNOSIS — R262 Difficulty in walking, not elsewhere classified: Secondary | ICD-10-CM | POA: Diagnosis not present

## 2022-07-16 DIAGNOSIS — E7849 Other hyperlipidemia: Secondary | ICD-10-CM | POA: Diagnosis not present

## 2022-07-16 DIAGNOSIS — C7951 Secondary malignant neoplasm of bone: Secondary | ICD-10-CM | POA: Insufficient documentation

## 2022-07-16 DIAGNOSIS — R7401 Elevation of levels of liver transaminase levels: Secondary | ICD-10-CM | POA: Diagnosis not present

## 2022-07-16 DIAGNOSIS — K625 Hemorrhage of anus and rectum: Secondary | ICD-10-CM | POA: Diagnosis not present

## 2022-07-16 DIAGNOSIS — I10 Essential (primary) hypertension: Secondary | ICD-10-CM | POA: Diagnosis not present

## 2022-07-16 DIAGNOSIS — D6481 Anemia due to antineoplastic chemotherapy: Secondary | ICD-10-CM | POA: Insufficient documentation

## 2022-07-16 DIAGNOSIS — T451X5A Adverse effect of antineoplastic and immunosuppressive drugs, initial encounter: Secondary | ICD-10-CM | POA: Diagnosis not present

## 2022-07-16 DIAGNOSIS — K921 Melena: Secondary | ICD-10-CM | POA: Diagnosis not present

## 2022-07-16 DIAGNOSIS — Z86718 Personal history of other venous thrombosis and embolism: Secondary | ICD-10-CM | POA: Insufficient documentation

## 2022-07-16 DIAGNOSIS — C61 Malignant neoplasm of prostate: Secondary | ICD-10-CM | POA: Insufficient documentation

## 2022-07-16 DIAGNOSIS — K922 Gastrointestinal hemorrhage, unspecified: Secondary | ICD-10-CM | POA: Diagnosis not present

## 2022-07-16 DIAGNOSIS — D649 Anemia, unspecified: Secondary | ICD-10-CM | POA: Diagnosis not present

## 2022-07-16 DIAGNOSIS — E785 Hyperlipidemia, unspecified: Secondary | ICD-10-CM | POA: Diagnosis present

## 2022-07-16 LAB — TYPE AND SCREEN
ABO/RH(D): A NEG
ABO/RH(D): A NEG
Antibody Screen: NEGATIVE
Antibody Screen: NEGATIVE
Unit division: 0

## 2022-07-16 LAB — CBC
HCT: 30.2 % — ABNORMAL LOW (ref 39.0–52.0)
HCT: 26.4 % — ABNORMAL LOW (ref 39.0–52.0)
Hemoglobin: 9.5 g/dL — ABNORMAL LOW (ref 13.0–17.0)
Hemoglobin: 8.5 g/dL — ABNORMAL LOW (ref 13.0–17.0)
MCH: 27.6 pg (ref 26.0–34.0)
MCH: 28.1 pg (ref 26.0–34.0)
MCHC: 31.5 g/dL (ref 30.0–36.0)
MCHC: 32.2 g/dL (ref 30.0–36.0)
MCV: 87.8 fL (ref 80.0–100.0)
MCV: 87.4 fL (ref 80.0–100.0)
Platelets: 54 10*3/uL — ABNORMAL LOW (ref 150–400)
Platelets: 46 10*3/uL — ABNORMAL LOW (ref 150–400)
RBC: 3.44 MIL/uL — ABNORMAL LOW (ref 4.22–5.81)
RBC: 3.02 MIL/uL — ABNORMAL LOW (ref 4.22–5.81)
RDW: 16.1 % — ABNORMAL HIGH (ref 11.5–15.5)
RDW: 15.9 % — ABNORMAL HIGH (ref 11.5–15.5)
WBC: 5.2 10*3/uL (ref 4.0–10.5)
WBC: 4.9 10*3/uL (ref 4.0–10.5)
nRBC: 18.2 % — ABNORMAL HIGH (ref 0.0–0.2)
nRBC: 16.9 % — ABNORMAL HIGH (ref 0.0–0.2)

## 2022-07-16 LAB — COMPREHENSIVE METABOLIC PANEL
ALT: 28 U/L (ref 0–44)
AST: 70 U/L — ABNORMAL HIGH (ref 15–41)
Albumin: 4.1 g/dL (ref 3.5–5.0)
Alkaline Phosphatase: 57 U/L (ref 38–126)
Anion gap: 10 (ref 5–15)
BUN: 25 mg/dL — ABNORMAL HIGH (ref 8–23)
CO2: 23 mmol/L (ref 22–32)
Calcium: 9.4 mg/dL (ref 8.9–10.3)
Chloride: 108 mmol/L (ref 98–111)
Creatinine, Ser: 1.4 mg/dL — ABNORMAL HIGH (ref 0.61–1.24)
GFR, Estimated: 46 mL/min — ABNORMAL LOW (ref >60)
Glucose, Bld: 120 mg/dL — ABNORMAL HIGH (ref 70–99)
Potassium: 4.1 mmol/L (ref 3.5–5.1)
Sodium: 141 mmol/L (ref 135–145)
Total Bilirubin: 1.4 mg/dL — ABNORMAL HIGH (ref 0.3–1.2)
Total Protein: 7.2 g/dL (ref 6.5–8.1)

## 2022-07-16 LAB — BPAM RBC
ISSUE DATE / TIME: 202406201048
Unit Type and Rh: 600

## 2022-07-16 LAB — PSA, TOTAL AND FREE
PSA, Free Pct: >3.4 %
PSA, Free: >50 ng/mL
Prostate Specific Ag, Serum: 1460 ng/mL — ABNORMAL HIGH (ref 0.0–4.0)

## 2022-07-16 LAB — PROTIME-INR
INR: 2.6 — ABNORMAL HIGH (ref 0.8–1.2)
Prothrombin Time: 28.1 seconds — ABNORMAL HIGH (ref 11.4–15.2)

## 2022-07-16 MED ORDER — ONDANSETRON HCL 4 MG/2ML IJ SOLN: 4.0000 mg | Freq: Four times a day (QID) | INTRAMUSCULAR | Status: DC | PRN

## 2022-07-16 MED ORDER — ATORVASTATIN CALCIUM 40 MG PO TABS
40.0000 mg | ORAL_TABLET | Freq: Every day | ORAL | Status: DC
  Administered 2022-07-16 – 2022-07-18 (×3): 40 mg via ORAL
  Filled 2022-07-16 (×3): qty 1

## 2022-07-16 MED ORDER — FERROUS SULFATE 325 (65 FE) MG PO TABS
325.0000 mg | ORAL_TABLET | Freq: Two times a day (BID) | ORAL | Status: DC
  Administered 2022-07-16 – 2022-07-18 (×4): 325 mg via ORAL
  Filled 2022-07-16 (×4): qty 1

## 2022-07-16 MED ORDER — SODIUM CHLORIDE 0.9 % IV SOLN: INTRAVENOUS | Status: AC

## 2022-07-16 MED ORDER — DAROLUTAMIDE 300 MG PO TABS: 600.0000 mg | ORAL_TABLET | Freq: Two times a day (BID) | ORAL | Status: DC

## 2022-07-16 MED ORDER — ACETAMINOPHEN 650 MG RE SUPP: 650.0000 mg | Freq: Four times a day (QID) | RECTAL | Status: DC | PRN

## 2022-07-16 MED ORDER — ATENOLOL 50 MG PO TABS
50.0000 mg | ORAL_TABLET | Freq: Every day | ORAL | Status: DC
  Administered 2022-07-16 – 2022-07-18 (×3): 50 mg via ORAL
  Filled 2022-07-16 (×3): qty 1

## 2022-07-16 MED ORDER — LOSARTAN POTASSIUM 25 MG PO TABS
25.0000 mg | ORAL_TABLET | Freq: Every day | ORAL | Status: DC
  Administered 2022-07-16 – 2022-07-18 (×3): 25 mg via ORAL
  Filled 2022-07-16 (×3): qty 1

## 2022-07-16 MED ORDER — ACETAMINOPHEN 325 MG PO TABS: 650.0000 mg | ORAL_TABLET | Freq: Four times a day (QID) | ORAL | Status: DC | PRN

## 2022-07-16 MED ORDER — PHYTONADIONE 5 MG PO TABS
5.0000 mg | ORAL_TABLET | Freq: Once | ORAL | Status: AC
  Administered 2022-07-16: 5 mg via ORAL
  Filled 2022-07-16: qty 1

## 2022-07-16 MED ORDER — ONDANSETRON HCL 4 MG PO TABS: 4.0000 mg | ORAL_TABLET | Freq: Four times a day (QID) | ORAL | Status: DC | PRN

## 2022-07-16 NOTE — Assessment & Plan Note (Signed)
BP elevated - Continue atenolol and losartan

## 2022-07-16 NOTE — Assessment & Plan Note (Signed)
Hgb had gotten down to low 7s, and patient had been symptomatic recently.  Transfused yesterday in outaptient setting, Hgb up to 9.5 on admission - Trend Hgb

## 2022-07-16 NOTE — Assessment & Plan Note (Signed)
This has been progressive down to the 50s in the last 3 months, likely due to Chemo

## 2022-07-16 NOTE — Assessment & Plan Note (Signed)
-  Continue Lipitor °

## 2022-07-16 NOTE — ED Triage Notes (Signed)
Pt arrived via POV. Pt c/o rectal bleeding w/ bright red blood in toilet bowel that they noticed this AM. Pt takes warfarin, and has hx GI bleeds.  AOx4

## 2022-07-16 NOTE — ED Provider Notes (Incomplete)
[place] EMERGENCY DEPARTMENT AT [facility] Provider Note   CSN: [id number] Arrival date & time: 07/16/22  0915     History {Add pertinent medical, surgical, social history, OB history to HPI:1} Chief Complaint  Patient presents with   Rectal Bleeding    Craig Freeman is a 89 y.o. male.  HPI      89 year old male with a history of prostate cancer metastatic to the bone, hypertension, DVT on who Coumadin, history of prior admission for rectal bleeding, who presents with concern for rectal bleeding.  Had a prior admission in January, where he had 3 episodes of rectal bleeding prior to admission, hemoglobin was 11-10, CT showed diverticulosis, gastroenterology evaluated, bleeding decreased and he did not require colonoscopy--presumed diverticular bleed.  Wed/thurs 6 INR, stopped coumadin with plan to start again today Had transfusion yesterday at cancer center 1[facility]  Home Medications Prior to Admission medications   Medication Sig Start Date End Date Taking? Authorizing Provider  albuterol (PROVENTIL HFA;VENTOLIN HFA) 108 (90 BASE) MCG/ACT inhaler Inhale 1-2 puffs into the lungs every 6 (six) hours as needed for wheezing or shortness of breath. Shortness of breath    [provider]  atenolol (TENORMIN) 50 MG tablet Take 50 mg by mouth daily. 02/17/18   [provider]  atorvastatin (LIPITOR) 40 MG tablet Take 40 mg by mouth daily.    [provider]  Calcium Carb-Cholecalciferol (CALCIUM 600+D3 PO) Take 1 tablet by mouth 2 (two) times daily with a meal.    [provider]  CVS MELATONIN 5 MG TABS Take 5 mg by mouth at bedtime as needed (for sleep). 09/15/17   [provider]  denosumab (XGEVA) 120 MG/1.7ML SOLN injection Inject 120 mg into the skin every 30 (thirty) days.    [provider]  ferrous sulfate 325 (65 FE) MG tablet Take 325 mg by mouth 2 (two) times daily with a meal.    [provider]   fluticasone (FLONASE) 50 MCG/ACT nasal spray Place 2 sprays into both nostrils daily. 12/20/17   [provider]  levocetirizine (XYZAL) 5 MG tablet Take 5 mg by mouth every evening. 10/05/17   [provider]  losartan (COZAAR) 50 MG tablet Take 50 mg by mouth daily.    [provider]  megestrol (MEGACE) 20 MG tablet Take 20 mg by mouth at bedtime as needed (for hot flashes).    [provider]  NUBEQA 300 MG tablet Take 600 mg by mouth 2 (two) times daily with a meal.    [provider]  ondansetron (ZOFRAN) 4 MG tablet Take 1 tablet (4 mg total) by mouth every 6 (six) hours as needed for nausea. 02/08/22   Rai, Delene Ruffini, MD  polyethylene glycol (MIRALAX) 17 g packet Take 17 g by mouth daily as needed for mild constipation or moderate constipation (also available OTC.). 02/08/22   Rai, Ripudeep K, MD  SYSTANE COMPLETE PF 0.6 % SOLN Place 1 drop into both eyes in the morning.    [provider]  triazolam (HALCION) 0.125 MG tablet Take 0.125 mg by mouth at bedtime as needed for sleep.    [provider]  warfarin (COUMADIN) 5 MG tablet Take 0.5-1 tablets (2.5-5 mg total) by mouth See admin instructions. Take 2.5 mg by mouth in the morning on Sun/Tues/Thurs and 5 mg on Mon/Wed/Fri/Sat 02/09/22   Rai, Ripudeep K, MD  WIXELA INHUB 100-50 MCG/ACT AEPB Inhale 1 puff into the lungs 2 (two)  times daily.    [provider]      Allergies    Amoxicillin, Augmentin [amoxicillin-pot clavulanate], and Zithromax [azithromycin]    Review of Systems   Review of Systems  Physical Exam Updated Vital Signs BP (!) 130/58 (BP Location: Left Arm)   Pulse 64   Temp 97.8 F (36.6 C) (Oral)   Resp 16   Ht 5\' 4"  (1.626 m)   Wt 59.4 kg   SpO2 99%   BMI 22.49 kg/m  Physical Exam  ED Results / Procedures / Treatments   Labs (all labs ordered are listed, but only abnormal results are displayed) Labs Reviewed  COMPREHENSIVE METABOLIC  PANEL  CBC  POC OCCULT BLOOD, ED  TYPE AND SCREEN    EKG None  Radiology No results found.  Procedures Procedures  {Document cardiac monitor, telemetry assessment procedure when appropriate:1}  Medications Ordered in ED Medications - No data to display  ED Course/ Medical Decision Making/ A&P   {   Click here for ABCD2, HEART and other calculatorsREFRESH Note before signing :1}                           ***   Labs completed and personally about interpreted by me show hemoglobin of 9.5, increased from hemoglobin yesterday of 7.8, creatinine of 1.4, increased from 1.16.  {Document critical care time when appropriate:1} {Document review of labs and clinical decision tools ie heart score, Chads2Vasc2 etc:1}  {Document your independent review of radiology images, and any outside records:1} {Document your discussion with family members, caretakers, and with consultants:1} {Document social determinants of health affecting pt's care:1} {Document your decision making why or why not admission, treatments were needed:1} Final Clinical Impression(s) / ED Diagnoses Final diagnoses:  None    Rx / DC Orders ED Discharge Orders     None

## 2022-07-16 NOTE — ED Notes (Signed)
Urine sample sent to the lab

## 2022-07-16 NOTE — ED Notes (Addendum)
ED TO INPATIENT HANDOFF REPORT  Name/Age/Gender Craig Freeman 89 y.o. male  Code Status Code Status History     Date Active Date Inactive Code Status Order ID Comments User Context   02/06/2022 1410 02/08/2022 2102 Full Code [id number]  Bobette Mo, MD ED   06/14/2011 0431 06/16/2011 2051 Full Code [id number]  Lanney Gins, RN Inpatient    Questions for Most Recent Historical Code Status (Order [id number])     Question Answer   By: Consent: discussion documented in EHR            Home/SNF/Other Home  Chief Complaint Rectal bleed [K62.5]  Level of Care/Admitting Diagnosis ED Disposition     ED Disposition  Admit   Condition  --   Comment  Hospital Area: [facility] [[id number]]  Level of Care: Med-Surg [16]  May place patient in observation at [facility] or [facility] if equivalent level of care is available:: No  Covid Evaluation: Asymptomatic - no recent exposure (last 10 days) testing not required  Diagnosis: Rectal bleed [244010]  Admitting Physician: Alberteen Sam [[id number]]  Attending Physician: Alberteen Sam [[id number]]          Medical History Past Medical History:  Diagnosis Date   Cancer (HCC)    Constipation    DVT (deep venous thrombosis), right    coumadin   High cholesterol    Hyperlipidemia    Hypertension     Allergies Allergies  Allergen Reactions   Amoxicillin Diarrhea   Augmentin [Amoxicillin-Pot Clavulanate] Diarrhea   Zithromax [Azithromycin] Other (See Comments)    "Blood in the stools"    IV Location/Drains/Wounds Patient Lines/Drains/Airways Status     Active Line/Drains/Airways     Name Placement date Placement time Site Days   Peripheral IV 07/16/22 20 G 1" Anterior;Distal;Left;Upper Arm 07/16/22  0942  Arm  less than 1            Labs/Imaging Results for orders placed or performed during the hospital encounter of 07/16/22 (from the past 48 hour(s))   Comprehensive metabolic panel     Status: Abnormal   Collection Time: 07/16/22  9:42 AM  Result Value Ref Range   Sodium 141 135 - 145 mmol/L   Potassium 4.1 3.5 - 5.1 mmol/L   Chloride 108 98 - 111 mmol/L   CO2 23 22 - 32 mmol/L   Glucose, Bld 120 (H) 70 - 99 mg/dL    Comment: Glucose reference range applies only to samples taken after fasting for at least 8 hours.   BUN 25 (H) 8 - 23 mg/dL   Creatinine, Ser 2.59 (H) 0.61 - 1.24 mg/dL   Calcium 9.4 8.9 - 56.3 mg/dL   Total Protein 7.2 6.5 - 8.1 g/dL   Albumin 4.1 3.5 - 5.0 g/dL   AST 70 (H) 15 - 41 U/L   ALT 28 0 - 44 U/L   Alkaline Phosphatase 57 38 - 126 U/L   Total Bilirubin 1.4 (H) 0.3 - 1.2 mg/dL   GFR, Estimated 46 (L) >60 mL/min    Comment: (NOTE) Calculated using the CKD-EPI Creatinine Equation (2021)    Anion gap 10 5 - 15    Comment: Performed at [facility], [address], [place], [place] [address]  CBC     Status: Abnormal   Collection Time: 07/16/22  9:42 AM  Result Value Ref Range   WBC 5.2 4.0 - 10.5 K/uL   RBC 3.44 (L) 4.22 -  5.81 MIL/uL   Hemoglobin 9.5 (L) 13.0 - 17.0 g/dL   HCT 65.7 (L) 84.6 - 96.2 %   MCV 87.8 80.0 - 100.0 fL   MCH 27.6 26.0 - 34.0 pg   MCHC 31.5 30.0 - 36.0 g/dL   RDW 95.2 (H) 84.1 - 32.4 %   Platelets 54 (L) 150 - 400 K/uL    Comment: SPECIMEN CHECKED FOR CLOTS Immature Platelet Fraction may be clinically indicated, consider ordering this additional test [id number] REPEATED TO VERIFY PLATELET COUNT CONFIRMED BY SMEAR    nRBC 18.2 (H) 0.0 - 0.2 %    Comment: Performed at [facility], [address] [place], [place] [address]  Type and screen [facility]     Status: None   Collection Time: 07/16/22  9:42 AM  Result Value Ref Range   ABO/RH(D) A NEG    Antibody Screen NEG    Sample Expiration      07/19/2022,2359 Performed at [facility], [address] [place], [place] [address]    No results  found.  Pending Labs Unresulted Labs (From admission, onward)     Start     Ordered   07/16/22 1151  Protime-INR  Once,   STAT        07/16/22 1151            Vitals/Pain Today's Vitals   07/16/22 0921 07/16/22 0923  BP: (!) 130/58   Pulse: 64   Resp: 16   Temp: 97.8 F (36.6 C)   TempSrc: Oral   SpO2: 99%   Weight:  131 lb (59.4 kg)  Height:  5\' 4"  (1.626 m)  PainSc:  0-No pain    Isolation Precautions No active isolations  Medications Medications - No data to display  Mobility walks

## 2022-07-16 NOTE — Assessment & Plan Note (Signed)
Similar to January, likely diverticular.  At that time no colonoscopy. - Reversed with oral vitamin K.  Now - Hold warfarin - Transfuse platelets if less than 50 - Trend CBC overnight - Consult GI, appreciate cares

## 2022-07-16 NOTE — Assessment & Plan Note (Signed)
I am not able to find convincing evidence Nubeqa is associated with bleeding - Continue darolutamide/Nubeqa

## 2022-07-16 NOTE — H&P (Signed)
History and Physical    Patient: Craig Freeman:[id number] DOB: Sep 26, 1924 DOA: 07/16/2022 DOS: the patient was seen and examined on 07/16/2022 PCP: Craig Housekeeper, MD  Patient coming from: Home  Chief Complaint:  Chief Complaint  Patient presents with   Rectal Bleeding       HPI:  Craig Freeman is a 89 y.o. M with metastatic prostate CA on darolutamide, thrombocytopenia, anemia, HTN, and hx remote DVT on warfarin who presented with rectal bleeding.  In usual state of health until this morning, had a BM with bright red blood filling the toilet bowl.  No associated pain or fever.  No vomiting.  Had a similar episode last January, resolved spontaneously, no colonoscopy.  In the ER, his hemoglobin was 9.5, higher than his recent baseline (due to transfusion yesterday), INR 2.6.  He was given oral vitamin K, had no further bleeding in the ER.  GI consulted.      Review of Systems  Constitutional:  Positive for malaise/fatigue.  Gastrointestinal:  Positive for blood in stool. Negative for abdominal pain, diarrhea, melena, nausea and vomiting.  Neurological:  Positive for weakness. Negative for focal weakness.  All other systems reviewed and are negative.    Past Medical History:  Diagnosis Date   Cancer (HCC)    Constipation    DVT (deep venous thrombosis), right    coumadin   High cholesterol    Hyperlipidemia    Hypertension    Past Surgical History:  Procedure Laterality Date   JOINT REPLACEMENT     Social History:  reports that he has never smoked. He has never used smokeless tobacco. He reports that he does not drink alcohol and does not use drugs.  Allergies  Allergen Reactions   Amoxicillin Diarrhea   Augmentin [Amoxicillin-Pot Clavulanate] Diarrhea   Zithromax [Azithromycin] Other (See Comments)    "Blood in the stools"    History reviewed. No pertinent family history.  Prior to Admission medications   Medication Sig Start Date End Date Taking?  Authorizing Provider  albuterol (PROVENTIL HFA;VENTOLIN HFA) 108 (90 BASE) MCG/ACT inhaler Inhale 1-2 puffs into the lungs every 6 (six) hours as needed for wheezing or shortness of breath. Shortness of breath   Yes [provider]  atenolol (TENORMIN) 50 MG tablet Take 50 mg by mouth daily. 02/17/18  Yes [provider]  atorvastatin (LIPITOR) 40 MG tablet Take 40 mg by mouth daily.   Yes [provider]  Calcium Carb-Cholecalciferol (CALCIUM 600+D3 PO) Take 1 tablet by mouth 2 (two) times daily with a meal.   Yes [provider]  [facility] MELATONIN 5 MG TABS Take 5 mg by mouth at bedtime as needed (for sleep). 09/15/17  Yes [provider]  denosumab (XGEVA) 120 MG/1.7ML SOLN injection Inject 120 mg into the skin every 30 (thirty) days.   Yes [provider]  ferrous sulfate 325 (65 FE) MG tablet Take 325 mg by mouth 2 (two) times daily with a meal.   Yes [provider]  fluticasone (FLONASE) 50 MCG/ACT nasal spray Place 2 sprays into both nostrils daily. 12/20/17  Yes [provider]  levocetirizine (XYZAL) 5 MG tablet Take 5 mg by mouth every evening. 10/05/17  Yes [provider]  losartan (COZAAR) 50 MG tablet Take 50 mg by mouth daily.   Yes [provider]  megestrol (MEGACE) 20 MG tablet Take 20 mg by mouth at bedtime as needed (for hot flashes).   Yes [provider]  NUBEQA 300 MG tablet Take 600 mg by mouth 2 (two) times daily with a meal.   Yes [provider]  ondansetron (ZOFRAN) 4 MG tablet Take 1 tablet (4 mg total) by mouth every 6 (six) hours as needed for nausea. 02/08/22  Yes Rai, Ripudeep K, MD  polyethylene glycol (MIRALAX) 17 g packet Take 17 g by mouth daily as needed for mild constipation or moderate constipation (also available OTC.). 02/08/22  Yes Rai, Ripudeep K, MD  SYSTANE COMPLETE PF 0.6 % SOLN Place 1 drop into both eyes in the morning.   Yes [provider]   triazolam (HALCION) 0.125 MG tablet Take 0.125 mg by mouth at bedtime as needed for sleep.   Yes [provider]  warfarin (COUMADIN) 5 MG tablet Take 0.5-1 tablets (2.5-5 mg total) by mouth See admin instructions. Take 2.5 mg by mouth in the morning on Sun/Tues/Thurs and 5 mg on Mon/Wed/Fri/Sat Patient taking differently: Take 2.5-5 mg by mouth See admin instructions. Take 2.5 mg by mouth in the morning on Sun/Tues/Thurs and 5 mg on Mon/Wed/Fri/Sat 02/09/22  Yes Rai, Ripudeep K, MD  WIXELA INHUB 100-50 MCG/ACT AEPB Inhale 1 puff into the lungs 2 (two) times daily.   Yes [provider]    Physical Exam: Vitals:   07/16/22 0921 07/16/22 0923 07/16/22 1436  BP: (!) 130/58  (!) 161/66  Pulse: 64  (!) 52  Resp: 16  18  Temp: 97.8 F (36.6 C)  98.3 F (36.8 C)  TempSrc: Oral  Oral  SpO2: 99%  100%  Weight:  59.4 kg   Height:  5\' 4"  (1.626 m)    General appearance: Frail elderly adult male, appears tired, lying in bed, dress and hygiene appear appropriate, responds appropriate to questions  HEENT:  Anicteric, conjunctivae and sclerae normal without injection or icterus, lids and lashes normal.  Visual tracking smooth.  OP moist without lesions, dentition normal, lips normal,    Cor: RRR, soft systolic murmur, JVP normal, no peripheral edema  Resp:  Normal respiratory rate and rhythm.  CTAB without rales or wheezes. Abd:  No TTP or rebound all quadrants.  No masses or organomegaly.   No ascites, distension. No scars.  No striae, dilated veins, rashes, or lesions.  MSK: Mild loss of subcutaneous muscle mass and fat symmetrical without gross deformities of the hands, large joints, or legs. Skin:  cap refill normal, Skin intact without significant rashes or lesions. Neuro:  Speech is fluent.  Hearing loss noted, mild psychomotor slowing, appropriate for age, patient's recall, seems mildly impaired, muscle tone is generally weak but symmetric, face symmetric, speech fluent.             Data Reviewed: Basic metabolic panel shows creatinine 1.4 from baseline 1.1-1.2, electrolytes normal AST slightly elevated at 70 CBC shows hemoglobin 9.5, platelets 54,000 INR 2.6      Assessment and Plan: * Rectal bleeding Similar to January, likely diverticular.  At that time no colonoscopy. - Reversed with oral vitamin K.  Now - Hold warfarin - Transfuse platelets if less than 50 - Trend CBC overnight - Consult GI, appreciate cares    History of DVT (deep vein thrombosis) Unclear indication for lifelong anticoagulation.  Daughter is only aware of a single DVT episode years ago.  I see no other indication, no Afib or active thrombosis.  Only records available in our system from the patient's PCP refer to "chronic DVT". - Hold warfarin - I would defer resumption to  PCP in the outpatient setting    Thrombocytopenia (HCC) This has been progressive down to the 50s in the last 3 months, likely due to Chemo  Anemia due to chemotherapy Hgb had gotten down to low 7s, and patient had been symptomatic recently.  Transfused yesterday in outaptient setting, Hgb up to 9.5 on admission - Trend Hgb  Transaminitis Mild, asymptomatic - Follow up with PCP  Hyperlipidemia - Continue Lipitor  Prostate cancer metastatic to bone (HCC) I am not able to find convincing evidence Nubeqa is associated with bleeding - Continue darolutamide/Nubeqa  Hypertension BP elevated - Continue atenolol and losartan         Advance Care Planning: Full code, confirmed with daughter at the bedside  Consults: GI Dr. Dulce Sellar  Family Communication: Daughter  Severity of Illness: The appropriate patient status for this patient is OBSERVATION. Observation status is judged to be reasonable and necessary in order to provide the required intensity of service to ensure the patient's safety. The patient's presenting symptoms, physical exam findings, and initial radiographic and  laboratory data in the context of their medical condition is felt to place them at decreased risk for further clinical deterioration. Furthermore, it is anticipated that the patient will be medically stable for discharge from the hospital within 2 midnights of admission.   Author: Alberteen Sam, MD 07/16/2022 4:44 PM  For on call review [email or web address].

## 2022-07-16 NOTE — Telephone Encounter (Signed)
No entry 

## 2022-07-16 NOTE — Assessment & Plan Note (Signed)
Mild, asymptomatic - Follow up with PCP

## 2022-07-16 NOTE — Hospital Course (Signed)
Craig Freeman is a 89 y.o. M with metastatic prostate CA on darolutamide, thrombocytopenia, anemia, HTN, and hx remote DVT on warfarin who presented with rectal bleeding.  In usual state of health until this morning, had a BM with bright red blood filling the toilet bowl.  No associated pain or fever.  No vomiting.  Had a similar episode last January, resolved spontaneously, no colonoscopy.  In the ER, his hemoglobin was 9.5, higher than his recent baseline (due to transfusion yesterday), INR 2.6.  He was given oral vitamin K, had no further bleeding in the ER.  GI consulted.

## 2022-07-16 NOTE — TOC Initial Note (Addendum)
Transition of Care [facility]) - Initial/Assessment Note    Patient Details  Name: Craig Freeman MRN: [id number] Date of Birth: [birth year]  [facility]) CM/SW Contact:    Adrian Prows, RN Phone Number: 07/16/2022, 4:06 PM  Clinical Narrative:                 TOC for d/c needs; pt asleep; called pt's dtr Noelle Penner [phone]); she says pt is from home and plans to return at d/c; she says pt does not experience food/housing insecurity, IPV, or difficulty paying utilities; she says pt has cane, Rolator, and glasses; pt also has shower chair and grab bars in shower; he receives HHPT/RN w/ [facility] and would like to con't services at d/c; pt has transportation, PCP, and insurance; Morrie Sheldon at [employer] of pt location; [facility] will follow.  Expected Discharge Plan: Home w Home Health Services Barriers to Discharge: Continued Medical Work up   Patient Goals and CMS Choice Patient states their goals for this hospitalization and ongoing recovery are:: home          Expected Discharge Plan and Services   Discharge Planning Services: CM Consult Post Acute Care Choice: Durable Medical Equipment, Home Health, Resumption of Svcs/PTA Provider (cane; HHPT/RN w/ [facility]) Living arrangements for the past 2 months: Single Family Home                                      Prior Living Arrangements/Services Living arrangements for the past 2 months: Single Family Home Lives with:: Self Patient language and need for interpreter reviewed:: Yes Do you feel safe going back to the place where you live?: Yes      Need for Family Participation in Patient Care: Yes (Comment) Care giver support system in place?: Yes (comment) Current home services: DME, Home OT, Home PT (cane; HHPT/RN w/ [facility]) Criminal Activity/Legal Involvement Pertinent to Current Situation/Hospitalization: No - Comment as needed  Activities of Daily Living Home Assistive  Devices/Equipment: Cane (specify quad or straight), Eyeglasses ADL Screening (condition at time of admission) Patient's cognitive ability adequate to safely complete daily activities?: Yes Is the patient deaf or have difficulty hearing?: No Does the patient have difficulty seeing, even when wearing glasses/contacts?: No Does the patient have difficulty concentrating, remembering, or making decisions?: No Patient able to express need for assistance with ADLs?: Yes Does the patient have difficulty dressing or bathing?: Yes Independently performs ADLs?: No Communication: Independent Dressing (OT): Needs assistance Is this a change from baseline?: Pre-admission baseline Grooming: Needs assistance Is this a change from baseline?: Pre-admission baseline Feeding: Independent Bathing: Needs assistance Is this a change from baseline?: Pre-admission baseline Toileting: Needs assistance Is this a change from baseline?: Pre-admission baseline In/Out Bed: Independent Walks in Home: Independent Does the patient have difficulty walking or climbing stairs?: No Weakness of Legs: Both Weakness of Arms/Hands: None  Permission Sought/Granted Permission sought to share information with : Case Manager Permission granted to share information with : Yes, Verbal Permission Granted  Share Information with NAME: Case Manager     Permission granted to share info w Relationship: Noelle Penner (dtr) [phone]     Emotional Assessment Appearance:: Appears older than stated age Attitude/Demeanor/Rapport: Unable to Assess (pt asleep) Affect (typically observed): Unable to Assess (pt asleep) Orientation: :  (unable to assess; pt asleep) Alcohol / Substance Use: Not Applicable Psych Involvement: No (comment)  Admission diagnosis:  Rectal bleed [K62.5] Patient Active Problem List   Diagnosis Date Noted   GI bleed 02/07/2022   Rectal bleeding 02/06/2022   Hyperlipidemia 02/06/2022   Prostate cancer  metastatic to bone (HCC) 12/30/2021   Ceruminosis, bilateral 09/18/2021   Abscess 06/14/2011   Fever 06/14/2011   Hypertension 06/14/2011   Hypokalemia 06/14/2011   Insomnia 06/14/2011   DEEP VENOUS THROMBOPHLEBITIS, HX OF 07/08/2009   PCP:  Georgann Housekeeper, MD Pharmacy:   [phone] Gin[place] Otto, [place] - [address] [place] [address] Phone: [phone] Fax: [phone]     Social Determinants of Health (SDOH) Social History: SDOH Screenings   Food Insecurity: No Food Insecurity (07/16/2022)  Housing: Low Risk  (07/16/2022)  Transportation Needs: No Transportation Needs (07/16/2022)  Utilities: Not At Risk (07/16/2022)  Depression (PHQ2-9): Low Risk  (02/12/2022)  Tobacco Use: Low Risk  (07/16/2022)   SDOH Interventions: Food Insecurity Interventions: Intervention Not Indicated, Inpatient TOC Housing Interventions: Intervention Not Indicated, Inpatient TOC Transportation Interventions: Intervention Not Indicated, Inpatient TOC Utilities Interventions: Intervention Not Indicated, Inpatient TOC   Readmission Risk Interventions     No data to display

## 2022-07-16 NOTE — Consult Note (Signed)
[facility] Consultation Note  Referring Provider: [facility] Primary Care Physician:  Georgann Housekeeper, MD  Reason for Consultation:  hematochezia  HPI: Craig Freeman is a 89 y.o. male admitted hematochezia.  Similar episode January.  On warfarin for remote history of dvt.  Much of history from patient's daughter.  Patient denies abdominal pain, hematemesis.  CT prior admission showed extensive diverticulosis.  Bleeding decreased and much less, per daughter, than it was prior admission.   Past Medical History:  Diagnosis Date   Cancer (HCC)    Constipation    DVT (deep venous thrombosis), right    coumadin   High cholesterol    Hyperlipidemia    Hypertension     Past Surgical History:  Procedure Laterality Date   JOINT REPLACEMENT      Prior to Admission medications   Medication Sig Start Date End Date Taking? Authorizing Provider  albuterol (PROVENTIL HFA;VENTOLIN HFA) 108 (90 BASE) MCG/ACT inhaler Inhale 1-2 puffs into the lungs every 6 (six) hours as needed for wheezing or shortness of breath. Shortness of breath   Yes [provider]  atenolol (TENORMIN) 50 MG tablet Take 50 mg by mouth daily. 02/17/18  Yes [provider]  atorvastatin (LIPITOR) 40 MG tablet Take 40 mg by mouth daily.   Yes [provider]  Calcium Carb-Cholecalciferol (CALCIUM 600+D3 PO) Take 1 tablet by mouth 2 (two) times daily with a meal.   Yes [provider]  [facility] MELATONIN 5 MG TABS Take 5 mg by mouth at bedtime as needed (for sleep). 09/15/17  Yes [provider]  denosumab (XGEVA) 120 MG/1.7ML SOLN injection Inject 120 mg into the skin every 30 (thirty) days.   Yes [provider]  ferrous sulfate 325 (65 FE) MG tablet Take 325 mg by mouth 2 (two) times daily with a meal.   Yes [provider]  fluticasone (FLONASE) 50 MCG/ACT nasal spray Place 2 sprays into both nostrils daily. 12/20/17  Yes [provider]   levocetirizine (XYZAL) 5 MG tablet Take 5 mg by mouth every evening. 10/05/17  Yes [provider]  losartan (COZAAR) 50 MG tablet Take 50 mg by mouth daily.   Yes [provider]  megestrol (MEGACE) 20 MG tablet Take 20 mg by mouth at bedtime as needed (for hot flashes).   Yes [provider]  NUBEQA 300 MG tablet Take 600 mg by mouth 2 (two) times daily with a meal.   Yes [provider]  ondansetron (ZOFRAN) 4 MG tablet Take 1 tablet (4 mg total) by mouth every 6 (six) hours as needed for nausea. 02/08/22  Yes Rai, Ripudeep K, MD  polyethylene glycol (MIRALAX) 17 g packet Take 17 g by mouth daily as needed for mild constipation or moderate constipation (also available OTC.). 02/08/22  Yes Rai, Ripudeep K, MD  SYSTANE COMPLETE PF 0.6 % SOLN Place 1 drop into both eyes in the morning.   Yes [provider]  triazolam (HALCION) 0.125 MG tablet Take 0.125 mg by mouth at bedtime as needed for sleep.   Yes [provider]  warfarin (COUMADIN) 5 MG tablet Take 0.5-1 tablets (2.5-5 mg total) by mouth See admin instructions. Take 2.5 mg by mouth in the morning on Sun/Tues/Thurs and 5 mg on Mon/Wed/Fri/Sat Patient taking differently: Take 2.5-5 mg by mouth See admin instructions. Take 2.5 mg by mouth in the morning on Sun/Tues/Thurs and 5 mg on Mon/Wed/Fri/Sat 02/09/22  Yes Rai, Delene Ruffini, MD  [facility] Fantasia  INHUB 100-50 MCG/ACT AEPB Inhale 1 puff into the lungs 2 (two) times daily.   Yes [provider]    No current facility-administered medications for this encounter.    Allergies as of 07/16/2022 - Review Complete 07/16/2022  Allergen Reaction Noted   Amoxicillin Diarrhea 02/06/2022   Augmentin [amoxicillin-pot clavulanate] Diarrhea 02/06/2022   Zithromax [azithromycin] Other (See Comments) 02/06/2022    No family history on file.  Social History   Socioeconomic History   Marital status: Married    Spouse name: Not on file   Number of  children: Not on file   Years of education: Not on file   Highest education level: Not on file  Occupational History   Not on file  Tobacco Use   Smoking status: Never   Smokeless tobacco: Never  Substance and Sexual Activity   Alcohol use: No   Drug use: No   Sexual activity: Not Currently  Other Topics Concern   Not on file  Social History Narrative   Not on file   Social Determinants of Health   Financial Resource Strain: Not on file  Food Insecurity: No Food Insecurity (02/12/2022)   Hunger Vital Sign    Worried About Running Out of Food in the Last Year: Never true    Ran Out of Food in the Last Year: Never true  Transportation Needs: No Transportation Needs (02/12/2022)   PRAPARE - Administrator, Civil Service (Medical): No    Lack of Transportation (Non-Medical): No  Physical Activity: Not on file  Stress: Not on file  Social Connections: Not on file  Intimate Partner Violence: Not At Risk (02/12/2022)   Humiliation, Afraid, Rape, and Kick questionnaire    Fear of Current or Ex-Partner: No    Emotionally Abused: No    Physically Abused: No    Sexually Abused: No    Review of Systems: As per HPI, all others negative  Physical Exam: Vital signs in last 24 hours: Temp:  [97.8 F (36.6 C)-98.3 F (36.8 C)] 98.3 F (36.8 C) (06/21 1436) Pulse Rate:  [52-64] 52 (06/21 1436) Resp:  [16-18] 18 (06/21 1436) BP: (130-161)/(58-66) 161/66 (06/21 1436) SpO2:  [99 %-100 %] 100 % (06/21 1436) Weight:  [59.4 kg] 59.4 kg (06/21 0923)   General:   Alert,  Well-developed, well-nourished, pleasant and cooperative in NAD Head:  Normocephalic and atraumatic. Eyes:  Sclera clear, no icterus.   Conjunctiva pale Ears:  Normal auditory acuity. Nose:  No deformity, discharge,  or lesions. Mouth:  No deformity or lesions.  Oropharynx pale and dry Neck:  Supple; no masses or thyromegaly. Lungs:  No respiratory distress Abdomen:  Soft, nontender and nondistended. No  masses, hepatosplenomegaly or hernias noted. Normal bowel sounds, without guarding, and without rebound.     Msk:  Symmetrical without gross deformities. Normal posture. Pulses:  Normal pulses noted. Extremities:  Without clubbing or edema. Neurologic:  Alert and  oriented x4;  grossly normal neurologically. Skin:  Intact without significant lesions or rashes. Psych:  Alert and cooperative. Normal mood and affect.   Lab Results: Recent Labs    07/15/22 0813 07/16/22 0942  WBC 5.4 5.2  HGB 7.8* 9.5*  HCT 24.3* 30.2*  PLT 57* 54*   BMET Recent Labs    07/16/22 0942  NA 141  K 4.1  CL 108  CO2 23  GLUCOSE 120*  BUN 25*  CREATININE 1.40*  CALCIUM 9.4   LFT Recent Labs    07/16/22  0942  PROT 7.2  ALBUMIN 4.1  AST 70*  ALT 28  ALKPHOS 57  BILITOT 1.4*   PT/INR Recent Labs    07/16/22 0942  LABPROT 28.1*  INR 2.6*    Studies/Results: No results found.  Impression:   Hematochezia, suspect diverticular bleeding. Very remote DVT. Chronic anticoagulation, warfarin, INR 2.6. Metastatic prostate cancer, in midst of treatment. Pancytopenia.  Chemotherapy on hold. Acute worsening anemia.  Doubt entirely blood loss (see #5 above).  Plan:   Advance to full liquids. If worsening/recurrent bleeding, consider reversal of coagulopathy; if bleeding persists with INR < 2, consider CT angiogram. Anticoagulation currently on hold. I have discussed with hospitalist to strongly weigh indication of ongoing warfarin; because if only for remote DVT, one could consider permanent cessation of anticoagulation, so as to decrease risk of recurrent GI bleeding. Follow CBC, transfuse if needed. [facility] will follow.   LOS: 0 days   Deloss Amico M  07/16/2022, 3:25 PM  Cell [phone] If no answer or after 5 PM call [phone]

## 2022-07-16 NOTE — Assessment & Plan Note (Signed)
Unclear indication for lifelong anticoagulation.  Daughter is only aware of a single DVT episode years ago.  I see no other indication, no Afib or active thrombosis.  Only records available in our system from the patient's PCP refer to "chronic DVT". - Hold warfarin - I would defer resumption to PCP in the outpatient setting

## 2022-07-16 NOTE — Plan of Care (Signed)

## 2022-07-17 DIAGNOSIS — K625 Hemorrhage of anus and rectum: Secondary | ICD-10-CM

## 2022-07-17 DIAGNOSIS — K922 Gastrointestinal hemorrhage, unspecified: Secondary | ICD-10-CM | POA: Diagnosis not present

## 2022-07-17 DIAGNOSIS — K5730 Diverticulosis of large intestine without perforation or abscess without bleeding: Secondary | ICD-10-CM | POA: Diagnosis not present

## 2022-07-17 DIAGNOSIS — I10 Essential (primary) hypertension: Secondary | ICD-10-CM | POA: Diagnosis not present

## 2022-07-17 DIAGNOSIS — T451X5A Adverse effect of antineoplastic and immunosuppressive drugs, initial encounter: Secondary | ICD-10-CM | POA: Diagnosis not present

## 2022-07-17 DIAGNOSIS — Z86718 Personal history of other venous thrombosis and embolism: Secondary | ICD-10-CM

## 2022-07-17 DIAGNOSIS — D6481 Anemia due to antineoplastic chemotherapy: Secondary | ICD-10-CM

## 2022-07-17 DIAGNOSIS — E7849 Other hyperlipidemia: Secondary | ICD-10-CM | POA: Diagnosis not present

## 2022-07-17 DIAGNOSIS — D649 Anemia, unspecified: Secondary | ICD-10-CM | POA: Diagnosis not present

## 2022-07-17 DIAGNOSIS — K921 Melena: Secondary | ICD-10-CM | POA: Diagnosis not present

## 2022-07-17 LAB — CBC
HCT: 25.3 % — ABNORMAL LOW (ref 39.0–52.0)
Hemoglobin: 8.2 g/dL — ABNORMAL LOW (ref 13.0–17.0)
MCH: 28.2 pg (ref 26.0–34.0)
MCHC: 32.4 g/dL (ref 30.0–36.0)
MCV: 86.9 fL (ref 80.0–100.0)
Platelets: 41 10*3/uL — ABNORMAL LOW (ref 150–400)
RBC: 2.91 MIL/uL — ABNORMAL LOW (ref 4.22–5.81)
RDW: 15.9 % — ABNORMAL HIGH (ref 11.5–15.5)
WBC: 4.9 10*3/uL (ref 4.0–10.5)
nRBC: 17.5 % — ABNORMAL HIGH (ref 0.0–0.2)

## 2022-07-17 LAB — PROTIME-INR
INR: 2 — ABNORMAL HIGH (ref 0.8–1.2)
Prothrombin Time: 22.9 seconds — ABNORMAL HIGH (ref 11.4–15.2)

## 2022-07-17 LAB — COMPREHENSIVE METABOLIC PANEL
ALT: 22 U/L (ref 0–44)
AST: 57 U/L — ABNORMAL HIGH (ref 15–41)
Albumin: 3.2 g/dL — ABNORMAL LOW (ref 3.5–5.0)
Alkaline Phosphatase: 41 U/L (ref 38–126)
Anion gap: 8 (ref 5–15)
BUN: 18 mg/dL (ref 8–23)
CO2: 21 mmol/L — ABNORMAL LOW (ref 22–32)
Calcium: 8.8 mg/dL — ABNORMAL LOW (ref 8.9–10.3)
Chloride: 108 mmol/L (ref 98–111)
Creatinine, Ser: 1.09 mg/dL (ref 0.61–1.24)
GFR, Estimated: >60 mL/min (ref >60)
Glucose, Bld: 91 mg/dL (ref 70–99)
Potassium: 4 mmol/L (ref 3.5–5.1)
Sodium: 137 mmol/L (ref 135–145)
Total Bilirubin: 1.1 mg/dL (ref 0.3–1.2)
Total Protein: 5.8 g/dL — ABNORMAL LOW (ref 6.5–8.1)

## 2022-07-17 MED ORDER — LIP MEDEX EX OINT
1.0000 | TOPICAL_OINTMENT | CUTANEOUS | Status: DC | PRN
  Filled 2022-07-17: qty 7

## 2022-07-17 NOTE — Plan of Care (Signed)

## 2022-07-17 NOTE — Evaluation (Signed)
Physical Therapy Evaluation Patient Details Name: Craig Freeman MRN: [id number] DOB: 1924-11-18 Today's Date: 07/17/2022  History of Present Illness  Pt is a 89yo male presenting with rectal bleeding. PMH: metastatic prostate cancer, thrombocytopenia, anemia, HTN, hx of DVT (warfarin), HLD.   Clinical Impression  Pt admitted with above diagnosis. Pt received supine in bed having awoken from a nap, no family present at beside, pt Aox1, stating "I'm at home right now." Pt demonstrated modified independence with bed mobility with HOB elevated, min guard for transfers and short-distance ambulation to bathroom and back. Pt currently with functional limitations due to the deficits listed below (see PT Problem List). Pt will benefit from acute skilled PT to increase their independence and safety with mobility to allow discharge.         Recommendations for follow up therapy are one component of a multi-disciplinary discharge planning process, led by the attending physician.  Recommendations may be updated based on patient status, additional functional criteria and insurance authorization.  Follow Up Recommendations       Assistance Recommended at Discharge Frequent or constant Supervision/Assistance  Patient can return home with the following  A little help with walking and/or transfers;A little help with bathing/dressing/bathroom;Assistance with cooking/housework;Assistance with feeding;Direct supervision/assist for medications management;Direct supervision/assist for financial management;Assist for transportation;Help with stairs or ramp for entrance    Equipment Recommendations Other (comment) (TBD, need to confirm equipment with family)  Recommendations for Other Services       Functional Status Assessment Patient has had a recent decline in their functional status and demonstrates the ability to make significant improvements in function in a reasonable and predictable amount of time.      Precautions / Restrictions Precautions Precautions: Fall Restrictions Weight Bearing Restrictions: No      Mobility  Bed Mobility Overal bed mobility: Modified Independent             General bed mobility comments: Increased time, no physical assist required with HOB elevated    Transfers Overall transfer level: Needs assistance Equipment used: Straight cane Transfers: Sit to/from Stand Sit to Stand: Min guard           General transfer comment: For safety only, no physical assist required.    Ambulation/Gait Ambulation/Gait assistance: Min guard Gait Distance (Feet): 45 Feet Assistive device: Straight cane Gait Pattern/deviations: WFL(Within Functional Limits) Gait velocity: decreased     General Gait Details: Pt ambulated with SPC and min guard, no obvious gait deviations noted or physical assist required.  Stairs            Wheelchair Mobility    Modified Rankin (Stroke Patients Only)       Balance Overall balance assessment: Needs assistance Sitting-balance support: Feet supported, No upper extremity supported Sitting balance-Leahy Scale: Good     Standing balance support: Single extremity supported, During functional activity, Reliant on assistive device for balance Standing balance-Leahy Scale: Fair                               Pertinent Vitals/Pain Pain Assessment Pain Assessment: No/denies pain    Home Living Family/patient expects to be discharged to:: Private residence Living Arrangements: Alone Available Help at Discharge: Family;Available PRN/intermittently Type of Home: House Home Access: Stairs to enter Entrance Stairs-Rails: Can reach both;Left Entrance Stairs-Number of Steps: 4   Home Layout: One level Home Equipment: Agricultural consultant (2 wheels);Cane - single point  Prior Function Prior Level of Function : Patient poor historian/Family not available             Mobility Comments: Pt reports IND  with mobility utilizing SPC while ambulating. ADLs Comments: Pt reports son helps with dressing and bathing.     Hand Dominance        Extremity/Trunk Assessment   Upper Extremity Assessment Upper Extremity Assessment: Overall WFL for tasks assessed    Lower Extremity Assessment Lower Extremity Assessment: Overall WFL for tasks assessed    Cervical / Trunk Assessment Cervical / Trunk Assessment: Kyphotic  Communication   Communication: No difficulties  Cognition Arousal/Alertness: Awake/alert Behavior During Therapy: WFL for tasks assessed/performed Overall Cognitive Status: No family/caregiver present to determine baseline cognitive functioning                                 General Comments: Pt AOx1, stating "I'm at home right now," unable to state location/date/president.        General Comments      Exercises     Assessment/Plan    PT Assessment Patient needs continued PT services  PT Problem List Decreased strength;Decreased activity tolerance;Decreased balance;Decreased mobility;Decreased cognition;Decreased knowledge of use of DME;Decreased safety awareness       PT Treatment Interventions DME instruction;Gait training;Functional mobility training;Therapeutic activities;Therapeutic exercise;Balance training;Cognitive remediation;Patient/family education    PT Goals (Current goals can be found in the Care Plan section)  Acute Rehab PT Goals Patient Stated Goal: None stated PT Goal Formulation: With patient Time For Goal Achievement: 07/31/22 Potential to Achieve Goals: Good    Frequency Min 1X/week     Co-evaluation               AM-PAC PT "6 Clicks" Mobility  Outcome Measure Help needed turning from your back to your side while in a flat bed without using bedrails?: None Help needed moving from lying on your back to sitting on the side of a flat bed without using bedrails?: None Help needed moving to and from a bed to a chair  (including a wheelchair)?: A Little Help needed standing up from a chair using your arms (e.g., wheelchair or bedside chair)?: A Little Help needed to walk in hospital room?: A Little Help needed climbing 3-5 steps with a railing? : A Lot 6 Click Score: 19    End of Session Equipment Utilized During Treatment: Gait belt Activity Tolerance: No increased pain;Patient tolerated treatment well Patient left: in chair;with call bell/phone within reach;with chair alarm set Nurse Communication: Mobility status PT Visit Diagnosis: Difficulty in walking, not elsewhere classified (R26.2)    Time: 1610-9604 PT Time Calculation (min) (ACUTE ONLY): 20 min   Charges:   PT Evaluation $PT Eval Low Complexity: 1 Low          Jamesetta Geralds, PT, DPT [facility] Department Office: [phone]  Jamesetta Geralds 07/17/2022, 5:03 PM

## 2022-07-17 NOTE — Care Management Obs Status (Signed)
MEDICARE OBSERVATION STATUS NOTIFICATION   Patient Details  Name: Craig Freeman MRN: [id number] Date of Birth: 07/27/1924   Medicare Observation Status Notification Given:  Yes    Adrian Prows, RN 07/17/2022, 3:30 PM

## 2022-07-17 NOTE — Progress Notes (Signed)
[facility]  PROGRESS NOTE  Craig Freeman:[id number] DOB: 27-Dec-1924 DOA: 07/16/2022 PCP: Craig Housekeeper, MD   Brief HPI:   89 y.o. M with metastatic prostate CA on darolutamide, thrombocytopenia, anemia, HTN, and hx remote DVT on warfarin who presented with rectal bleeding.  Patient woke up yesterday morning and BM with bright red blood filling the toilet bowl.  No vomiting, he had a similar episode in the last year generally which spontaneously resolved.  No colonoscopy was done. In the ED Hb was 9.5.  INR 2.6.  He was given oral vitamin K with no further bleeding in the ER.  GI was consulted.  No plan for colonoscopy.   Assessment/Plan:    Rectal bleeding -Resolved; likely diverticular bleed -Hemoglobin stable at 8.2 -Likely due to anticoagulation with Coumadin, vitamin K was given to reverse the anticoagulation, today INR is 2.0 -GI following, no further intervention recommended -If hemoglobin stable in the morning, will discharge home   History of remote DVT -Patient had 1 episode of DVT more than 10 years ago -No history of atrial fibrillation or active thrombosis -Discussed with patient's daughter, due to repeated episodes of rectal bleeding and also no clear indication for DVT, he just had 1 episode of DVT many years ago -I think it will be advised to stop warfarin, daughter is in agreement -He can follow-up with oncology/PCP as outpatient for further discussion regarding restarting Coumadin   Anemia due to chemotherapy -Patient developed anemia and was transfused PRBC as outpatient -Hemoglobin is up to 8.2 today   Transaminitis -Mild, follow-up PCP as outpatient   Prostate cancer with mets to bone -Continue Nubeqa/darolutamide   Hypertension -Blood pressure is stable -Continue atenolol, losartan    Medications     atenolol  50 mg Oral Daily   atorvastatin  40 mg Oral Daily   darolutamide  600 mg Oral BID WC   ferrous sulfate  325 mg Oral BID  WC   losartan  25 mg Oral Daily     Data Reviewed:   CBG:  No results for input(s): "GLUCAP" in the last 168 hours.  SpO2: 100 %    Vitals:   07/16/22 2304 07/17/22 0219 07/17/22 0553 07/17/22 1310  BP: (!) 152/64 (!) 143/59 132/63 (!) 155/56  Pulse: (!) 51 (!) 52 (!) 52 (!) 49  Resp: 15 16 14 16   Temp: 98.2 F (36.8 C) 98.3 F (36.8 C) 98.1 F (36.7 C) 98.8 F (37.1 C)  TempSrc:    Oral  SpO2: 100% 100% 100% 100%  Weight:      Height:          Data Reviewed:  Basic Metabolic Panel: Recent Labs  Lab 07/13/22 1508 07/16/22 0942 07/17/22 0427  NA 140 141 137  K 3.8 4.1 4.0  CL 108 108 108  CO2 22 23 21*  GLUCOSE 100* 120* 91  BUN 17 25* 18  CREATININE 1.16 1.40* 1.09  CALCIUM 9.1 9.4 8.8*    CBC: Recent Labs  Lab 07/13/22 1508 07/15/22 0813 07/16/22 0942 07/16/22 1730 07/17/22 0427  WBC 4.7 5.4 5.2 4.9 4.9  NEUTROABS 2.4 3.5  --   --   --   HGB 7.5* 7.8* 9.5* 8.5* 8.2*  HCT 23.4* 24.3* 30.2* 26.4* 25.3*  MCV 87.6 87.4 87.8 87.4 86.9  PLT 66* 57* 54* 46* 41*    LFT Recent Labs  Lab 07/13/22 1508 07/16/22 0942 07/17/22 0427  AST 53* 70* 57*  ALT 19 28 22  ALKPHOS 52 57 41  BILITOT 0.8 1.4* 1.1  PROT 6.1* 7.2 5.8*  ALBUMIN 3.7 4.1 3.2*     Antibiotics: Anti-infectives (From admission, onward)    None        DVT prophylaxis: SCDs  Code Status: Full code  Family Communication: Discussed with patient's daughter Craig Freeman on phone   CONSULTS gastroenterology   Subjective   Denies further episodes of bleeding   Objective    Physical Examination:   General: Appears in no acute distress Cardiovascular: S1-S2, regular, no murmur auscultated Respiratory: Lungs clear to auscultation bilaterally Abdomen: Soft, nontender, no organomegaly Extremities: No edema in the lower extremities Neurologic: Alert, oriented x 3, intact insight and judgment   Status is: Inpatient:             Meredeth Ide   [facility] If 7PM-7AM, please contact night-coverage at [email or web address], Office  [phone]   07/17/2022, 2:16 PM  LOS: 0 days

## 2022-07-17 NOTE — Plan of Care (Signed)

## 2022-07-17 NOTE — Progress Notes (Signed)
[facility] Gastroenterology Progress Note  Craig Freeman 89 y.o. 08-Jul-1924   Subjective: No bleeding or BMs overnight. Hungry. Family in room. Nurse in room.  Objective: Vital signs: Vitals:   07/17/22 0553 07/17/22 1310  BP: 132/63 (!) 155/56  Pulse: (!) 52 (!) 49  Resp: 14 16  Temp: 98.1 F (36.7 C) 98.8 F (37.1 C)  SpO2: 100% 100%    Physical Exam: Gen: lethargic, elderly, well-nourished, no acute distress  HEENT: anicteric sclera CV: RRR Chest: CTA B Abd: soft, nontender, nondistended, +BS Ext: no edema  Lab Results: Recent Labs    07/16/22 0942 07/17/22 0427  NA 141 137  K 4.1 4.0  CL 108 108  CO2 23 21*  GLUCOSE 120* 91  BUN 25* 18  CREATININE 1.40* 1.09  CALCIUM 9.4 8.8*   Recent Labs    07/16/22 0942 07/17/22 0427  AST 70* 57*  ALT 28 22  ALKPHOS 57 41  BILITOT 1.4* 1.1  PROT 7.2 5.8*  ALBUMIN 4.1 3.2*   Recent Labs    07/15/22 0813 07/16/22 0942 07/16/22 1730 07/17/22 0427  WBC 5.4   < > 4.9 4.9  NEUTROABS 3.5  --   --   --   HGB 7.8*   < > 8.5* 8.2*  HCT 24.3*   < > 26.4* 25.3*  MCV 87.4   < > 87.4 86.9  PLT 57*   < > 46* 41*   < > = values in this interval not displayed.      Assessment/Plan: Painless hematochezia likely diverticular that is resolving. Coumadin on hold and defer discussion on whether to resume anticoagulation to his primary doctor. Soft diet. Conservative management. Ok to d/c tomorrow if stable from GI standpoint.   Craig Freeman 07/17/2022, 1:12 PM  Questions please call [phone] ID: Craig Freeman, male   DOB: 02/24/1924, 89 y.o.   MRN: [id number]

## 2022-07-18 DIAGNOSIS — D6481 Anemia due to antineoplastic chemotherapy: Secondary | ICD-10-CM | POA: Diagnosis not present

## 2022-07-18 DIAGNOSIS — T451X5A Adverse effect of antineoplastic and immunosuppressive drugs, initial encounter: Secondary | ICD-10-CM | POA: Diagnosis not present

## 2022-07-18 DIAGNOSIS — K625 Hemorrhage of anus and rectum: Secondary | ICD-10-CM | POA: Diagnosis not present

## 2022-07-18 DIAGNOSIS — K922 Gastrointestinal hemorrhage, unspecified: Secondary | ICD-10-CM | POA: Diagnosis not present

## 2022-07-18 DIAGNOSIS — Z86718 Personal history of other venous thrombosis and embolism: Secondary | ICD-10-CM | POA: Diagnosis not present

## 2022-07-18 DIAGNOSIS — I10 Essential (primary) hypertension: Secondary | ICD-10-CM | POA: Diagnosis not present

## 2022-07-18 LAB — BASIC METABOLIC PANEL
Anion gap: 9 (ref 5–15)
BUN: 21 mg/dL (ref 8–23)
CO2: 23 mmol/L (ref 22–32)
Calcium: 8.8 mg/dL — ABNORMAL LOW (ref 8.9–10.3)
Chloride: 105 mmol/L (ref 98–111)
Creatinine, Ser: 1.11 mg/dL (ref 0.61–1.24)
GFR, Estimated: >60 mL/min (ref >60)
Glucose, Bld: 92 mg/dL (ref 70–99)
Potassium: 3.9 mmol/L (ref 3.5–5.1)
Sodium: 137 mmol/L (ref 135–145)

## 2022-07-18 LAB — CBC
HCT: 26.3 % — ABNORMAL LOW (ref 39.0–52.0)
Hemoglobin: 8.4 g/dL — ABNORMAL LOW (ref 13.0–17.0)
MCH: 27.7 pg (ref 26.0–34.0)
MCHC: 31.9 g/dL (ref 30.0–36.0)
MCV: 86.8 fL (ref 80.0–100.0)
Platelets: 41 10*3/uL — ABNORMAL LOW (ref 150–400)
RBC: 3.03 MIL/uL — ABNORMAL LOW (ref 4.22–5.81)
RDW: 15.7 % — ABNORMAL HIGH (ref 11.5–15.5)
WBC: 5.6 10*3/uL (ref 4.0–10.5)
nRBC: 17.1 % — ABNORMAL HIGH (ref 0.0–0.2)

## 2022-07-18 LAB — GLUCOSE, CAPILLARY: Glucose-Capillary: 108 mg/dL — ABNORMAL HIGH (ref 70–99)

## 2022-07-18 MED ORDER — ENSURE ENLIVE PO LIQD
237.0000 mL | Freq: Two times a day (BID) | ORAL | Status: DC
  Administered 2022-07-18: 237 mL via ORAL

## 2022-07-18 MED ORDER — POLYETHYLENE GLYCOL 3350 17 G PO PACK
17.0000 g | PACK | Freq: Every day | ORAL | Status: DC
  Administered 2022-07-18: 17 g via ORAL
  Filled 2022-07-18: qty 1

## 2022-07-18 MED ORDER — POLYETHYLENE GLYCOL 3350 17 G PO PACK: 17.0000 g | PACK | Freq: Every day | ORAL | 0 refills | Status: DC | PRN

## 2022-07-18 NOTE — Discharge Summary (Signed)
Physician Discharge Summary   Patient: Craig Freeman MRN: [id number] DOB: January 13, 1925  Admit date:     07/16/2022  Discharge date: 07/18/22  Discharge Physician: Meredeth Ide   PCP: Georgann Housekeeper, MD   Recommendations at discharge:   Follow-up PCP in 1 week Stop taking warfarin Will be discharged with home health PT, daughter to stay with him 24 hours  Discharge Diagnoses: Principal Problem:   Rectal bleeding Active Problems:   History of DVT (deep vein thrombosis)   Hypertension   Prostate cancer metastatic to bone (HCC)   Hyperlipidemia   Transaminitis   Anemia due to chemotherapy   Thrombocytopenia (HCC)  Resolved Problems:   * No resolved hospital problems. [place] [facility]:  Craig Freeman is a 89 y.o. M with metastatic prostate CA on darolutamide, thrombocytopenia, anemia, HTN, and hx remote DVT on warfarin who presented with rectal bleeding.  In usual state of health until this morning, had a BM with bright red blood filling the toilet bowl.  No associated pain or fever.  No vomiting.  Had a similar episode last January, resolved spontaneously, no colonoscopy.  In the ER, his hemoglobin was 9.5, higher than his recent baseline (due to transfusion yesterday), INR 2.6.  He was given oral vitamin K, had no further bleeding in the ER.  GI consulted.  Assessment and Plan:  Rectal bleeding -Resolved; likely diverticular bleed -Hemoglobin stable at 8.4 this morning -Likely due to anticoagulation with Coumadin, vitamin K was given to reverse the anticoagulation, today INR is 2.0 -GI was consulted no further intervention recommended -Will discharge home today     History of remote DVT -Patient had 1 episode of DVT more than 10 years ago -No history of atrial fibrillation or active thrombosis -Discussed with patient's daughter, due to repeated episodes of rectal bleeding and also no clear indication for DVT, he just had 1 episode of DVT many years ago -I think it  will be advised to stop warfarin, daughter is in agreement -He can follow-up with oncology/PCP as outpatient for further discussion regarding restarting Coumadin     Anemia due to chemotherapy -Patient developed anemia and was transfused PRBC as outpatient -Hemoglobin is up to 8.4 today     Transaminitis -Mild, follow-up PCP as outpatient     Prostate cancer with mets to bone -Continue Nubeqa/darolutamide     Hypertension -Blood pressure is stable -Continue atenolol, losartan   Constipation -Continue daily MiraLAX as needed for constipation       Consultants: Gastroenterology Procedures performed:  Disposition: Home Diet recommendation:  Discharge Diet Orders (From admission, onward)     Start     Ordered   07/18/22 0000  Diet - low sodium heart healthy        07/18/22 0930           Regular diet DISCHARGE MEDICATION: Allergies as of 07/18/2022       Reactions   Amoxicillin Diarrhea   Augmentin [amoxicillin-pot Clavulanate] Diarrhea   Zithromax [azithromycin] Other (See Comments)   "Blood in the stools"        Medication List     STOP taking these medications    warfarin 5 MG tablet Commonly known as: COUMADIN       TAKE these medications    albuterol 108 (90 Base) MCG/ACT inhaler Commonly known as: VENTOLIN HFA Inhale 1-2 puffs into the lungs every 6 (six) hours as needed for wheezing or shortness of breath. Shortness of breath   atenolol  50 MG tablet Commonly known as: TENORMIN Take 50 mg by mouth daily.   atorvastatin 40 MG tablet Commonly known as: LIPITOR Take 40 mg by mouth daily.   CALCIUM 600+D3 PO Take 1 tablet by mouth 2 (two) times daily with a meal.   [employer]S Melatonin 5 MG Tabs Generic drug: melatonin Take 5 mg by mouth at bedtime as needed (for sleep).   ferrous sulfate 325 (65 FE) MG tablet Take 325 mg by mouth 2 (two) times daily with a meal.   fluticasone 50 MCG/ACT nasal spray Commonly known as: FLONASE Place 2  sprays into both nostrils daily.   levocetirizine 5 MG tablet Commonly known as: XYZAL Take 5 mg by mouth every evening.   losartan 50 MG tablet Commonly known as: COZAAR Take 50 mg by mouth daily.   megestrol 20 MG tablet Commonly known as: MEGACE Take 20 mg by mouth at bedtime as needed (for hot flashes).   Nubeqa 300 MG tablet Generic drug: darolutamide Take 600 mg by mouth 2 (two) times daily with a meal.   ondansetron 4 MG tablet Commonly known as: ZOFRAN Take 1 tablet (4 mg total) by mouth every 6 (six) hours as needed for nausea.   polyethylene glycol 17 g packet Commonly known as: MiraLax Take 17 g by mouth daily as needed for mild constipation or moderate constipation (also available OTC.).   Systane Complete PF 0.6 % Soln Generic drug: Propylene Glycol (PF) Place 1 drop into both eyes in the morning.   triazolam 0.125 MG tablet Commonly known as: HALCION Take 0.125 mg by mouth at bedtime as needed for sleep.   Wixela Inhub 100-50 MCG/ACT Aepb Generic drug: fluticasone-salmeterol Inhale 1 puff into the lungs 2 (two) times daily.   Xgeva 120 MG/1.7ML Soln injection Generic drug: denosumab Inject 120 mg into the skin every 30 (thirty) days.        Discharge Exam: Filed Weights   07/16/22 0923  Weight: 59.4 kg   General-appears in no acute distress Heart-S1-S2, regular, no murmur auscultated Lungs-clear to auscultation bilaterally, no wheezing or crackles auscultated Abdomen-soft, nontender, no organomegaly Extremities-no edema in the lower extremities Neuro-alert, oriented x3, no focal deficit noted  Condition at discharge: good  The results of significant diagnostics from this hospitalization (including imaging, microbiology, ancillary and laboratory) are listed below for reference.   Imaging Studies: No results found.  Microbiology: Results for orders placed or performed during the hospital encounter of 10/31/06  Urine culture     Status:  None   Collection Time: 10/31/06  4:05 AM   Specimen: Urine, Random  Result Value Ref Range Status   Specimen Description URINE, RANDOM  Final   Special Requests NONE  Final   Colony Count >=100,000 COLONIES/ML  Final   Culture ENTEROCOCCUS SPECIES  Final   Report Status 11/02/2006 FINAL  Final   Organism ID, Bacteria ENTEROCOCCUS SPECIES  Final      Susceptibility   Enterococcus species - MIC    AMPICILLIN <=2 Sensitive     LEVOFLOXACIN 1 Sensitive     NITROFURANTOIN <=16 Sensitive     VANCOMYCIN <=1 Sensitive   Culture, blood (routine x 2)     Status: None   Collection Time: 10/31/06  4:08 AM   Specimen: BLOOD  Result Value Ref Range Status   Specimen Description BLOOD RIGHT ARM  Final   Special Requests BOTTLES DRAWN AEROBIC ONLY 10CC IN BOTTLE  Final   Culture NO GROWTH 5 DAYS  Final  Report Status 11/06/2006 FINAL  Final  Culture, blood (routine x 2)     Status: None   Collection Time: 10/31/06  4:20 AM   Specimen: BLOOD  Result Value Ref Range Status   Specimen Description BLOOD LEFT ARM  Final   Special Requests   Final    BOTTLES DRAWN AEROBIC AND ANAEROBIC 10CC IN [facility] BOTTLE   Culture NO GROWTH 5 DAYS  Final   Report Status 11/06/2006 FINAL  Final    Labs: CBC: Recent Labs  Lab 07/13/22 1508 07/15/22 0813 07/16/22 0942 07/16/22 1730 07/17/22 0427 07/18/22 0405  WBC 4.7 5.4 5.2 4.9 4.9 5.6  NEUTROABS 2.4 3.5  --   --   --   --   HGB 7.5* 7.8* 9.5* 8.5* 8.2* 8.4*  HCT 23.4* 24.3* 30.2* 26.4* 25.3* 26.3*  MCV 87.6 87.4 87.8 87.4 86.9 86.8  PLT 66* 57* 54* 46* 41* 41*   Basic Metabolic Panel: Recent Labs  Lab 07/13/22 1508 07/16/22 0942 07/17/22 0427 07/18/22 0405  NA 140 141 137 137  K 3.8 4.1 4.0 3.9  CL 108 108 108 105  CO2 22 23 21* 23  GLUCOSE 100* 120* 91 92  BUN 17 25* 18 21  CREATININE 1.16 1.40* 1.09 1.11  CALCIUM 9.1 9.4 8.8* 8.8*   Liver Function Tests: Recent Labs  Lab 07/13/22 1508 07/16/22 0942 07/17/22 0427  AST 53* 70*  57*  ALT 19 28 22   ALKPHOS 52 57 41  BILITOT 0.8 1.4* 1.1  PROT 6.1* 7.2 5.8*  ALBUMIN 3.7 4.1 3.2*   CBG: No results for input(s): "GLUCAP" in the last 168 hours.  Discharge time spent: greater than 30 minutes.  Signed: Meredeth Ide, MD [facility] 07/18/2022

## 2022-07-18 NOTE — TOC Transition Note (Signed)
Transition of Care [facility]) - CM/SW Discharge Note   Patient Details  Name: Craig Freeman MRN: [id number] Date of Birth: 1924-12-15  [facility] CM/SW Contact:  Adrian Prows, RN Phone Number: 07/18/2022, 9:55 AM   Clinical Narrative:    D/C orders received; spoke w/ pt's dtr Noelle Penner; she confirms  she will be w/ pt 24/7, she would also like to resume HHPT/RN w/ [facility]; Barbara Cower at agency notified; no TOC needs.    Final next level of care: Home w Home Health Services Barriers to Discharge: No Barriers Identified   Patient Goals and CMS Choice      Discharge Placement                         Discharge Plan and Services Additional resources added to the After Visit Summary for     Discharge Planning Services: CM Consult Post Acute Care Choice: Durable Medical Equipment (cane, rolator, shower chair; HHPT/RN w/ [facility])                               Social Determinants of Health (SDOH) Interventions SDOH Screenings   Food Insecurity: No Food Insecurity (07/16/2022)  Housing: Low Risk  (07/16/2022)  Transportation Needs: No Transportation Needs (07/16/2022)  Utilities: Not At Risk (07/16/2022)  Depression (PHQ2-9): Low Risk  (02/12/2022)  Tobacco Use: Low Risk  (07/16/2022)     Readmission Risk Interventions     No data to display

## 2022-07-20 ENCOUNTER — Encounter: Payer: Self-pay | Admitting: Hematology and Oncology

## 2022-07-20 DIAGNOSIS — I129 Hypertensive chronic kidney disease with stage 1 through stage 4 chronic kidney disease, or unspecified chronic kidney disease: Secondary | ICD-10-CM | POA: Diagnosis not present

## 2022-07-20 DIAGNOSIS — N1831 Chronic kidney disease, stage 3a: Secondary | ICD-10-CM | POA: Diagnosis not present

## 2022-07-20 DIAGNOSIS — C7951 Secondary malignant neoplasm of bone: Secondary | ICD-10-CM | POA: Diagnosis not present

## 2022-07-20 DIAGNOSIS — D630 Anemia in neoplastic disease: Secondary | ICD-10-CM | POA: Diagnosis not present

## 2022-07-20 DIAGNOSIS — D631 Anemia in chronic kidney disease: Secondary | ICD-10-CM | POA: Diagnosis not present

## 2022-07-20 DIAGNOSIS — C61 Malignant neoplasm of prostate: Secondary | ICD-10-CM | POA: Diagnosis not present

## 2022-07-21 ENCOUNTER — Ambulatory Visit (HOSPITAL_COMMUNITY)
Admission: RE | Admit: 2022-07-21 | Discharge: 2022-07-21 | Disposition: A | Discharge status: Home / Self Care | Payer: Medicare Other | Source: Ambulatory Visit | Attending: Hematology and Oncology | Admitting: Hematology and Oncology

## 2022-07-21 DIAGNOSIS — C61 Malignant neoplasm of prostate: Secondary | ICD-10-CM | POA: Insufficient documentation

## 2022-07-21 DIAGNOSIS — C7951 Secondary malignant neoplasm of bone: Secondary | ICD-10-CM | POA: Diagnosis not present

## 2022-07-21 DIAGNOSIS — C787 Secondary malignant neoplasm of liver and intrahepatic bile duct: Secondary | ICD-10-CM | POA: Diagnosis not present

## 2022-07-21 MED ORDER — PIFLIFOLASTAT F 18 (PYLARIFY) INJECTION
9.0000 | Freq: Once | INTRAVENOUS | Status: AC
  Administered 2022-07-21: 8.706 via INTRAVENOUS

## 2022-07-23 DIAGNOSIS — Z7901 Long term (current) use of anticoagulants: Secondary | ICD-10-CM | POA: Diagnosis not present

## 2022-07-23 DIAGNOSIS — I10 Essential (primary) hypertension: Secondary | ICD-10-CM | POA: Diagnosis not present

## 2022-07-23 DIAGNOSIS — R531 Weakness: Secondary | ICD-10-CM | POA: Diagnosis not present

## 2022-07-23 DIAGNOSIS — K625 Hemorrhage of anus and rectum: Secondary | ICD-10-CM | POA: Diagnosis not present

## 2022-07-23 DIAGNOSIS — R7401 Elevation of levels of liver transaminase levels: Secondary | ICD-10-CM | POA: Diagnosis not present

## 2022-07-26 DIAGNOSIS — D631 Anemia in chronic kidney disease: Secondary | ICD-10-CM | POA: Diagnosis not present

## 2022-07-26 DIAGNOSIS — N1831 Chronic kidney disease, stage 3a: Secondary | ICD-10-CM | POA: Diagnosis not present

## 2022-07-26 DIAGNOSIS — I129 Hypertensive chronic kidney disease with stage 1 through stage 4 chronic kidney disease, or unspecified chronic kidney disease: Secondary | ICD-10-CM | POA: Diagnosis not present

## 2022-07-26 DIAGNOSIS — C61 Malignant neoplasm of prostate: Secondary | ICD-10-CM | POA: Diagnosis not present

## 2022-07-26 DIAGNOSIS — C7951 Secondary malignant neoplasm of bone: Secondary | ICD-10-CM | POA: Diagnosis not present

## 2022-07-26 DIAGNOSIS — D630 Anemia in neoplastic disease: Secondary | ICD-10-CM | POA: Diagnosis not present

## 2022-07-27 DIAGNOSIS — I129 Hypertensive chronic kidney disease with stage 1 through stage 4 chronic kidney disease, or unspecified chronic kidney disease: Secondary | ICD-10-CM | POA: Diagnosis not present

## 2022-07-27 DIAGNOSIS — D631 Anemia in chronic kidney disease: Secondary | ICD-10-CM | POA: Diagnosis not present

## 2022-07-27 DIAGNOSIS — C7951 Secondary malignant neoplasm of bone: Secondary | ICD-10-CM | POA: Diagnosis not present

## 2022-07-27 DIAGNOSIS — C61 Malignant neoplasm of prostate: Secondary | ICD-10-CM | POA: Diagnosis not present

## 2022-07-27 DIAGNOSIS — D630 Anemia in neoplastic disease: Secondary | ICD-10-CM | POA: Diagnosis not present

## 2022-07-27 DIAGNOSIS — N1831 Chronic kidney disease, stage 3a: Secondary | ICD-10-CM | POA: Diagnosis not present

## 2022-07-28 ENCOUNTER — Telehealth: Payer: Self-pay | Admitting: Pharmacy Technician

## 2022-07-28 ENCOUNTER — Other Ambulatory Visit: Payer: Self-pay | Admitting: Hematology and Oncology

## 2022-07-28 ENCOUNTER — Telehealth: Payer: Self-pay | Admitting: Pharmacist

## 2022-07-28 ENCOUNTER — Inpatient Hospital Stay: Payer: Medicare Other | Attending: Hematology and Oncology | Admitting: Hematology and Oncology

## 2022-07-28 ENCOUNTER — Inpatient Hospital Stay: Payer: Medicare Other

## 2022-07-28 ENCOUNTER — Other Ambulatory Visit (HOSPITAL_COMMUNITY): Payer: Self-pay

## 2022-07-28 ENCOUNTER — Other Ambulatory Visit: Payer: Self-pay

## 2022-07-28 VITALS — BP 112/58 | HR 70 | Temp 97.3°F | Resp 17 | Wt 125.4 lb

## 2022-07-28 DIAGNOSIS — C61 Malignant neoplasm of prostate: Secondary | ICD-10-CM

## 2022-07-28 DIAGNOSIS — Z7952 Long term (current) use of systemic steroids: Secondary | ICD-10-CM | POA: Insufficient documentation

## 2022-07-28 DIAGNOSIS — C7951 Secondary malignant neoplasm of bone: Secondary | ICD-10-CM | POA: Diagnosis not present

## 2022-07-28 DIAGNOSIS — Z79899 Other long term (current) drug therapy: Secondary | ICD-10-CM | POA: Diagnosis not present

## 2022-07-28 DIAGNOSIS — Z881 Allergy status to other antibiotic agents status: Secondary | ICD-10-CM | POA: Diagnosis not present

## 2022-07-28 DIAGNOSIS — Z880 Allergy status to penicillin: Secondary | ICD-10-CM | POA: Insufficient documentation

## 2022-07-28 DIAGNOSIS — R630 Anorexia: Secondary | ICD-10-CM | POA: Insufficient documentation

## 2022-07-28 DIAGNOSIS — Z86718 Personal history of other venous thrombosis and embolism: Secondary | ICD-10-CM | POA: Insufficient documentation

## 2022-07-28 LAB — CBC WITH DIFFERENTIAL/PLATELET
Abs Immature Granulocytes: 0.75 10*3/uL — ABNORMAL HIGH (ref 0.00–0.07)
Basophils Absolute: 0 10*3/uL (ref 0.0–0.1)
Basophils Relative: 1 %
Eosinophils Absolute: 0 10*3/uL (ref 0.0–0.5)
Eosinophils Relative: 1 %
HCT: 26.5 % — ABNORMAL LOW (ref 39.0–52.0)
Hemoglobin: 8.5 g/dL — ABNORMAL LOW (ref 13.0–17.0)
Immature Granulocytes: 13 %
Lymphocytes Relative: 26 %
Lymphs Abs: 1.6 10*3/uL (ref 0.7–4.0)
MCH: 28.5 pg (ref 26.0–34.0)
MCHC: 32.1 g/dL (ref 30.0–36.0)
MCV: 88.9 fL (ref 80.0–100.0)
Monocytes Absolute: 0.4 10*3/uL (ref 0.1–1.0)
Monocytes Relative: 6 %
Neutro Abs: 3.2 10*3/uL (ref 1.7–7.7)
Neutrophils Relative %: 53 %
Platelets: 51 10*3/uL — ABNORMAL LOW (ref 150–400)
RBC: 2.98 MIL/uL — ABNORMAL LOW (ref 4.22–5.81)
RDW: 16.1 % — ABNORMAL HIGH (ref 11.5–15.5)
WBC: 6 10*3/uL (ref 4.0–10.5)
nRBC: 16.9 % — ABNORMAL HIGH (ref 0.0–0.2)

## 2022-07-28 LAB — COMPREHENSIVE METABOLIC PANEL
ALT: 23 U/L (ref 0–44)
AST: 46 U/L — ABNORMAL HIGH (ref 15–41)
Albumin: 3.8 g/dL (ref 3.5–5.0)
Alkaline Phosphatase: 50 U/L (ref 38–126)
Anion gap: 10 (ref 5–15)
BUN: 31 mg/dL — ABNORMAL HIGH (ref 8–23)
CO2: 25 mmol/L (ref 22–32)
Calcium: 9.8 mg/dL (ref 8.9–10.3)
Chloride: 107 mmol/L (ref 98–111)
Creatinine, Ser: 1.22 mg/dL (ref 0.61–1.24)
GFR, Estimated: 54 mL/min — ABNORMAL LOW (ref >60)
Glucose, Bld: 121 mg/dL — ABNORMAL HIGH (ref 70–99)
Potassium: 4 mmol/L (ref 3.5–5.1)
Sodium: 142 mmol/L (ref 135–145)
Total Bilirubin: 0.8 mg/dL (ref 0.3–1.2)
Total Protein: 6.4 g/dL — ABNORMAL LOW (ref 6.5–8.1)

## 2022-07-28 MED ORDER — ABIRATERONE ACETATE 250 MG PO TABS
500.0000 mg | ORAL_TABLET | Freq: Every day | ORAL | 0 refills | Status: DC
  Filled 2022-07-28: qty 180, 90d supply, fill #0

## 2022-07-28 MED ORDER — PREDNISONE 5 MG PO TABS: 5.0000 mg | ORAL_TABLET | Freq: Every day | ORAL | 1 refills | Status: DC

## 2022-07-28 NOTE — Telephone Encounter (Signed)
Oral Oncology Patient Advocate Encounter  After completing a benefits investigation, prior authorization for Abiraterone is not required at this time through [employer].  Patient's copay is $538.72.    Cash pricing is available at [facility], $70 for a 30 day supply.  Patient has been placed on the grant waitlist for his disease state.  Jinger Neighbors, CPhT-Adv Oncology Pharmacy Patient Advocate [facility] Direct Number: [phone]  Fax: [phone]

## 2022-07-28 NOTE — Telephone Encounter (Addendum)
Oral Oncology Pharmacist Encounter  Received new prescription for Zytiga (abiraterone acetate) for the treatment of metastatic castrate resistant prostate cancer in conjunction with ADT, planned duration until disease progression or unacceptable drug toxicity.  CBC w/ Diff and CMP from 07/28/22 assessed, noted patient with SCr of 1.22 mg/dL (CrCl ~ 40.9 mL/min) - no renal dose adjustments required for Zytiga. Patient starting on dose reduction of Zytiga - staff message sent to MD to confirm reduced dose in addition to confirming if patient will also be initiated on concomitant prednisone.   ADDENDUM 07/30/2022 9:26 AM Dose reduction confirmed with Dr. Al Pimple. Prescription dose and frequency assessed for appropriateness.  Current medication list in Epic reviewed, DDIs with Zytiga identified: Category C drug-drug interaction between Zytiga and Atorvastatin - Zytiga may increase risk of muscle ADEs from atorvastatin. No changes in therapy warranted at this time, recommend monitoring for muscle aches, etc. No change in therapy warranted at this time.   Evaluated chart and no patient barriers to medication adherence noted.   Prescription has been e-scribed to the [facility] for benefits analysis and approval.  Oral Oncology Clinic will continue to follow for insurance authorization, copayment issues, initial counseling and start date.  Lenord Carbo, PharmD, BCPS, [facility] Hematology/Oncology Clinical Pharmacist [facility] and [facility] Oral Chemotherapy Navigation Clinics [phone] 07/28/2022 2:33 PM

## 2022-07-28 NOTE — Assessment & Plan Note (Addendum)
This is a very pleasant 89 year old male patient with newly diagnosed castrate resistant metastatic prostate cancer referred to medical oncology for recommendations.  Patient was diagnosed with metastatic prostate cancer back in October 2022 with a PSA of 331 at baseline currently on ADT and Nubeqa.  He was not on full dose of Nubeqa due to funding and he most recently was titrated to full dose in August or September 2023.  His PSA back in November was 24.5 hence he was referred to medical oncology for additional recommendations.  During his last visit we have discussed about proceeding with PSMA PET, repeating PSA and returning to clinic.   PSMA PET confirmed multiple intense skeletal metastasis, no evidence of visceral metastasis. Given skeletal only mets, we have advised proceeding with Xofigo.  He continued on Xofigo but clinically deteriorated with elevation in PSA hence we repeat the PSMA PET which showed marked progression in the skeletal metastasis.  He is today with his son and his daughter for follow-up.  We have reviewed the imaging findings which have confirmed progression of the disease.  He has definitely gotten weaker and became more frail compared to his initial visit here with me.  His daughter however tells me that he walks at home slowly but he has been continuing to try and stay independent.  She is just worried that he has been eating well.  I have presented options for treatment including Zytiga versus palliative care and comfort measures.  I asked the patient what he would like to do and he said he does not know.  Meanwhile daughter asked him if he would want to try the pill Zytiga and he said yes.  He has severe cytopenias likely from bone marrow infiltration from the metastatic prostate cancer.  I will try to dose reduce the Zytiga to 500 mg daily and prednisone daily 5 mg for better tolerance and reassess in about 4 weeks for follow-up.  If he does not respond to [facility], he will not  be a candidate for any chemotherapy.  He should then proceed with comfort care and hospice.  Rachel Moulds MD  .

## 2022-07-28 NOTE — Progress Notes (Signed)
[facility] CONSULT NOTE  Patient Care Team: Georgann Housekeeper, MD as PCP - General (Internal Medicine) Cherlyn Cushing, RN as Oncology Nurse Navigator Jerilee Field, MD as Consulting Physician (Urology)  CHIEF COMPLAINTS/PURPOSE OF CONSULTATION:  Metastatic prostate cancer  SUMMARY OF ONCOLOGIC HISTORY: Oncology History  Prostate cancer metastatic to bone [facility])  10/28/2020 Cancer Staging   Staging form: Prostate, AJCC 8th Edition - Clinical stage from 10/28/2020: Stage IVB (cT2c, cN0, cM1b, PSA: 331, Grade Group: 5) - Signed by Marcello Fennel, PA-C on 02/12/2022 Histopathologic type: Adenocarcinoma, NOS Stage prefix: Initial diagnosis Prostate specific antigen (PSA) range: 20 or greater Gleason primary pattern: 4 Gleason secondary pattern: 5 Gleason score: 9 Histologic grading system: 5 grade system Number of biopsy cores examined: 6 Number of biopsy cores positive: 6 Location of positive needle core biopsies: Both sides   12/30/2021 Initial Diagnosis   Prostate cancer metastatic to bone [facility])    Chronology  Patient was diagnosed with metastatic prostate cancer in October 2022 with PSA of 331 at baseline, T2/T3 disease on digital rectal exam, prostate being 115 g.  He had CT imaging with contrast at that time which showed sclerotic lesions and bony pelvis as well as L2 vertebral body.  No lymphadenopathy or soft tissue metastasis.  Started ADT in October 2022 and now back in June 2023.  His PSA nadir was 2.76 in April 2023 but rose to 4 in July 2023.   He also started on Xgeva in Jan 2023.  His PSA has risen to 24.5 in November 2023.  He is very active 89 year old, goes to the gym every day walks 5 times a week.  He denies any complaints, no new bone pains, change in breathing or bowel habits.  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  Interval history  Ms. Pagliaro  is here for follow-up with her daughter.   Since his last visit here he had a PET/CT and is here to follow-up on the results.  He is in a wheelchair today with his son and his daughter.  He apparently has been able to walk slowly at home.  He however has not been eating and some of this is because of dental issues and some of this is lack of appetite. He denies any pain today.  No change in breathing. Rest of the pertinent 10 point ROS reviewed and negative.  MEDICAL HISTORY:  Past Medical History:  Diagnosis Date   Cancer (HCC)    Constipation    DVT (deep venous thrombosis), right    coumadin   High cholesterol    Hyperlipidemia    Hypertension     SURGICAL HISTORY: Past Surgical History:  Procedure Laterality Date   JOINT REPLACEMENT      SOCIAL HISTORY: Social History   Socioeconomic History   Marital status: Married    Spouse name: Not on file   Number of children: Not on file   Years of education: Not on file   Highest education level: Not on file  Occupational History   Not on file  Tobacco Use   Smoking status: Never   Smokeless tobacco: Never  Substance and Sexual Activity   Alcohol use: No   Drug use: No   Sexual activity: Not Currently  Other Topics Concern   Not on file  Social History Narrative   Not on file   Social Determinants of Health   Financial Resource Strain:  Not on file  Food Insecurity: No Food Insecurity (07/16/2022)   Hunger Vital Sign    Worried About Running Out of Food in the Last Year: Never true    Ran Out of Food in the Last Year: Never true  Transportation Needs: No Transportation Needs (07/16/2022)   PRAPARE - Administrator, Civil Service (Medical): No    Lack of Transportation (Non-Medical): No  Physical Activity: Not on file  Stress: Not on file  Social Connections: Not on file  Intimate Partner Violence: Not At Risk (07/16/2022)   Humiliation, Afraid, Rape, and Kick questionnaire    Fear of Current or  Ex-Partner: No    Emotionally Abused: No    Physically Abused: No    Sexually Abused: No    FAMILY HISTORY: No family history on file.  ALLERGIES:  is allergic to amoxicillin, augmentin [amoxicillin-pot clavulanate], and zithromax [azithromycin].  MEDICATIONS:  Current Outpatient Medications  Medication Sig Dispense Refill   abiraterone acetate (ZYTIGA) 250 MG tablet Take 2 tablets (500 mg total) by mouth daily. Take on an empty stomach 1 hour before or 2 hours after a meal 180 tablet 0   albuterol (PROVENTIL HFA;VENTOLIN HFA) 108 (90 BASE) MCG/ACT inhaler Inhale 1-2 puffs into the lungs every 6 (six) hours as needed for wheezing or shortness of breath. Shortness of breath     atenolol (TENORMIN) 50 MG tablet Take 50 mg by mouth daily.     atorvastatin (LIPITOR) 40 MG tablet Take 40 mg by mouth daily.     Calcium Carb-Cholecalciferol (CALCIUM 600+D3 PO) Take 1 tablet by mouth 2 (two) times daily with a meal.     [employer]S MELATONIN 5 MG TABS Take 5 mg by mouth at bedtime as needed (for sleep).     denosumab (XGEVA) 120 MG/1.7ML SOLN injection Inject 120 mg into the skin every 30 (thirty) days.     ferrous sulfate 325 (65 FE) MG tablet Take 325 mg by mouth 2 (two) times daily with a meal.     fluticasone (FLONASE) 50 MCG/ACT nasal spray Place 2 sprays into both nostrils daily.     levocetirizine (XYZAL) 5 MG tablet Take 5 mg by mouth every evening.     losartan (COZAAR) 50 MG tablet Take 50 mg by mouth daily.     megestrol (MEGACE) 20 MG tablet Take 20 mg by mouth at bedtime as needed (for hot flashes).     ondansetron (ZOFRAN) 4 MG tablet Take 1 tablet (4 mg total) by mouth every 6 (six) hours as needed for nausea. 30 tablet 0   polyethylene glycol (MIRALAX) 17 g packet Take 17 g by mouth daily as needed for mild constipation or moderate constipation (also available OTC.). 14 each 0   predniSONE (DELTASONE) 5 MG tablet Take 1 tablet (5 mg total) by mouth daily with breakfast. 30 tablet 1    SYSTANE COMPLETE PF 0.6 % SOLN Place 1 drop into both eyes in the morning.     triazolam (HALCION) 0.125 MG tablet Take 0.125 mg by mouth at bedtime as needed for sleep.     WIXELA INHUB 100-50 MCG/ACT AEPB Inhale 1 puff into the lungs 2 (two) times daily.     No current facility-administered medications for this visit.    REVIEW OF SYSTEMS:   Constitutional: Denies fevers, chills or abnormal night sweats Eyes: Denies blurriness of vision, double vision or watery eyes Ears, nose, mouth, throat, and face: Denies mucositis or sore throat Respiratory: Denies cough,  dyspnea or wheezes Cardiovascular: Denies palpitation, chest discomfort or lower extremity swelling Gastrointestinal:  Denies nausea, heartburn or change in bowel habits Skin: Denies abnormal skin rashes Lymphatics: Denies new lymphadenopathy or easy bruising Neurological:Denies numbness, tingling or new weaknesses Behavioral/Psych: Mood is stable, no new changes All other systems were reviewed with the patient and are negative.  PHYSICAL EXAMINATION: ECOG PERFORMANCE STATUS: 0 - Asymptomatic  Vitals:   07/28/22 1125  BP: (!) 112/58  Pulse: 70  Resp: 17  Temp: (!) 97.3 F (36.3 C)  SpO2: 99%   Filed Weights   07/28/22 1125  Weight: 125 lb 6.4 oz (56.9 kg)    GENERAL: He appears weak, no acute distress  LABORATORY DATA:  I have reviewed the data as listed Lab Results  Component Value Date   WBC 6.0 07/28/2022   HGB 8.5 (L) 07/28/2022   HCT 26.5 (L) 07/28/2022   MCV 88.9 07/28/2022   PLT 51 (L) 07/28/2022   Lab Results  Component Value Date   NA 142 07/28/2022   K 4.0 07/28/2022   CL 107 07/28/2022   CO2 25 07/28/2022    RADIOGRAPHIC STUDIES: I have personally reviewed the radiological reports and agreed with the findings in the report.  ASSESSMENT AND PLAN:  Prostate cancer metastatic to bone [facility]) This is a very pleasant 89 year old male patient with newly diagnosed castrate resistant metastatic  prostate cancer referred to medical oncology for recommendations.  Patient was diagnosed with metastatic prostate cancer back in October 2022 with a PSA of 331 at baseline currently on ADT and Nubeqa.  He was not on full dose of Nubeqa due to funding and he most recently was titrated to full dose in August or September 2023.  His PSA back in November was 24.5 hence he was referred to medical oncology for additional recommendations.  During his last visit we have discussed about proceeding with PSMA PET, repeating PSA and returning to clinic.   PSMA PET confirmed multiple intense skeletal metastasis, no evidence of visceral metastasis. Given skeletal only mets, we have advised proceeding with Xofigo.  He continued on Xofigo but clinically deteriorated with elevation in PSA hence we repeat the PSMA PET which showed marked progression in the skeletal metastasis.  He is today with his son and his daughter for follow-up.  We have reviewed the imaging findings which have confirmed progression of the disease.  He has definitely gotten weaker and became more frail compared to his initial visit here with me.  His daughter however tells me that he walks at home slowly but he has been continuing to try and stay independent.  She is just worried that he has been eating well.  I have presented options for treatment including Zytiga versus palliative care and comfort measures.  I asked the patient what he would like to do and he said he does not know.  Meanwhile daughter asked him if he would want to try the pill Zytiga and he said yes.  He has severe cytopenias likely from bone marrow infiltration from the metastatic prostate cancer.  I will try to dose reduce the Zytiga to 500 mg daily and prednisone daily 5 mg for better tolerance and reassess in about 4 weeks for follow-up.  If he does not respond to [facility], he will not be a candidate for any chemotherapy.  He should then proceed with comfort care and  hospice.  Rachel Moulds MD  .  Total time spent 40 minutes including history, physical exam, review  of records, counseling and coordination of care All questions were answered. The patient knows to call the clinic with any problems, questions or concerns.    Rachel Moulds, MD 07/28/22

## 2022-07-30 ENCOUNTER — Other Ambulatory Visit (HOSPITAL_COMMUNITY): Payer: Self-pay

## 2022-07-30 ENCOUNTER — Other Ambulatory Visit: Payer: Self-pay

## 2022-07-30 DIAGNOSIS — C61 Malignant neoplasm of prostate: Secondary | ICD-10-CM | POA: Diagnosis not present

## 2022-07-30 DIAGNOSIS — D630 Anemia in neoplastic disease: Secondary | ICD-10-CM | POA: Diagnosis not present

## 2022-07-30 DIAGNOSIS — D631 Anemia in chronic kidney disease: Secondary | ICD-10-CM | POA: Diagnosis not present

## 2022-07-30 DIAGNOSIS — C7951 Secondary malignant neoplasm of bone: Secondary | ICD-10-CM | POA: Diagnosis not present

## 2022-07-30 DIAGNOSIS — N1831 Chronic kidney disease, stage 3a: Secondary | ICD-10-CM | POA: Diagnosis not present

## 2022-07-30 DIAGNOSIS — I129 Hypertensive chronic kidney disease with stage 1 through stage 4 chronic kidney disease, or unspecified chronic kidney disease: Secondary | ICD-10-CM | POA: Diagnosis not present

## 2022-07-30 LAB — PSA, TOTAL AND FREE
PSA, Free Pct: >3.1 %
PSA, Free: >50 ng/mL
Prostate Specific Ag, Serum: 1628 ng/mL — ABNORMAL HIGH (ref 0.0–4.0)

## 2022-07-30 MED ORDER — ABIRATERONE ACETATE 250 MG PO TABS
500.0000 mg | ORAL_TABLET | Freq: Every day | ORAL | 2 refills | Status: DC
Start: 2022-07-30 — End: 2022-09-04
  Filled 2022-07-30 (×3): qty 60, 30d supply, fill #0
  Filled 2022-08-18: qty 60, 30d supply, fill #1

## 2022-07-30 NOTE — Telephone Encounter (Signed)
Oral Chemotherapy Pharmacist Encounter  I spoke with patient's daughter for overview of: Zytiga (abiraterone acetate) for the treatment of metastatic, castration-resistant prostate cancer in conjunction with prednisone, planned duration until disease progression or unacceptable toxicity.   Counseled patient's daughter on administration, dosing, side effects, monitoring, drug-food interactions, safe handling, storage, and disposal.  Patient will take Zytiga 250mg  tablets, 2 tablets (500mg ) by mouth once daily on an empty stomach, 1 hour before or 2 hours after a meal.  Patient will take prednisone 5mg  tablet, 1 tablet by mouth one daily with breakfast.  Zytiga start date: 08/03/22  Adverse effects include but are not limited to: peripheral edema, GI upset, hypertension, hot flashes, fatigue, and arthralgias.    Prednisone prescription has been sent to [facility]. Patient's daughter states she has already picked this up.   Reviewed importance of keeping a medication schedule and plan for any missed doses. No barriers to medication adherence identified.  Medication reconciliation performed and medication/allergy list updated.  All questions answered.  Patient's daughter voiced understanding and appreciation.   Medication education handout placed in mail for patient and patient's daughter. Patient's daughter knows to call the office with questions or concerns. Oral Chemotherapy Clinic phone number provided.   Lenord Carbo, PharmD, BCPS, [facility] Hematology/Oncology Clinical Pharmacist [facility] Oral Chemotherapy Navigation Clinics [phone] 07/30/2022 9:27 AM

## 2022-08-02 ENCOUNTER — Telehealth: Payer: Self-pay | Admitting: *Deleted

## 2022-08-02 ENCOUNTER — Other Ambulatory Visit (HOSPITAL_COMMUNITY): Payer: Self-pay

## 2022-08-02 DIAGNOSIS — R4701 Aphasia: Secondary | ICD-10-CM | POA: Diagnosis not present

## 2022-08-02 DIAGNOSIS — M179 Osteoarthritis of knee, unspecified: Secondary | ICD-10-CM | POA: Diagnosis not present

## 2022-08-02 DIAGNOSIS — R498 Other voice and resonance disorders: Secondary | ICD-10-CM | POA: Diagnosis not present

## 2022-08-02 DIAGNOSIS — C7951 Secondary malignant neoplasm of bone: Secondary | ICD-10-CM | POA: Diagnosis not present

## 2022-08-02 DIAGNOSIS — G4700 Insomnia, unspecified: Secondary | ICD-10-CM | POA: Diagnosis not present

## 2022-08-02 DIAGNOSIS — Z8673 Personal history of transient ischemic attack (TIA), and cerebral infarction without residual deficits: Secondary | ICD-10-CM | POA: Diagnosis not present

## 2022-08-02 DIAGNOSIS — E7800 Pure hypercholesterolemia, unspecified: Secondary | ICD-10-CM | POA: Diagnosis not present

## 2022-08-02 DIAGNOSIS — I700 Atherosclerosis of aorta: Secondary | ICD-10-CM | POA: Diagnosis not present

## 2022-08-02 DIAGNOSIS — I129 Hypertensive chronic kidney disease with stage 1 through stage 4 chronic kidney disease, or unspecified chronic kidney disease: Secondary | ICD-10-CM | POA: Diagnosis not present

## 2022-08-02 DIAGNOSIS — E440 Moderate protein-calorie malnutrition: Secondary | ICD-10-CM | POA: Diagnosis not present

## 2022-08-02 DIAGNOSIS — Z7901 Long term (current) use of anticoagulants: Secondary | ICD-10-CM | POA: Diagnosis not present

## 2022-08-02 DIAGNOSIS — D631 Anemia in chronic kidney disease: Secondary | ICD-10-CM | POA: Diagnosis not present

## 2022-08-02 DIAGNOSIS — C61 Malignant neoplasm of prostate: Secondary | ICD-10-CM | POA: Diagnosis not present

## 2022-08-02 DIAGNOSIS — Z87891 Personal history of nicotine dependence: Secondary | ICD-10-CM | POA: Diagnosis not present

## 2022-08-02 DIAGNOSIS — R7303 Prediabetes: Secondary | ICD-10-CM | POA: Diagnosis not present

## 2022-08-02 DIAGNOSIS — K219 Gastro-esophageal reflux disease without esophagitis: Secondary | ICD-10-CM | POA: Diagnosis not present

## 2022-08-02 DIAGNOSIS — D630 Anemia in neoplastic disease: Secondary | ICD-10-CM | POA: Diagnosis not present

## 2022-08-02 DIAGNOSIS — I825Z1 Chronic embolism and thrombosis of unspecified deep veins of right distal lower extremity: Secondary | ICD-10-CM | POA: Diagnosis not present

## 2022-08-02 DIAGNOSIS — D696 Thrombocytopenia, unspecified: Secondary | ICD-10-CM | POA: Diagnosis not present

## 2022-08-02 DIAGNOSIS — N1831 Chronic kidney disease, stage 3a: Secondary | ICD-10-CM | POA: Diagnosis not present

## 2022-08-02 DIAGNOSIS — Z79899 Other long term (current) drug therapy: Secondary | ICD-10-CM | POA: Diagnosis not present

## 2022-08-02 DIAGNOSIS — J45909 Unspecified asthma, uncomplicated: Secondary | ICD-10-CM | POA: Diagnosis not present

## 2022-08-02 NOTE — Telephone Encounter (Signed)
Patient daughter Craig Freeman called. Patient lives with her and she is primary caregiver. She said patient Craig Freeman will be delivered today and he will start tomorrow - reviewed administration instructions given by R. Rulon Abide, [facility] for Verzenio and Prednisone and reviewed with her and she verbalized understanding. She also asked about patient lab results from 07/28/22, in order to compare with past results. Encouraged her to call [facility] pharmacist or this office for any further questions or concerns. Craig Freeman said she looks forward to speaking with Dr. Al Pimple in 4 weeks at follow up appt.

## 2022-08-03 ENCOUNTER — Telehealth: Payer: Self-pay | Admitting: Hematology and Oncology

## 2022-08-03 NOTE — Telephone Encounter (Signed)
Spoke with patient confirming upcoming appointment  

## 2022-08-04 ENCOUNTER — Other Ambulatory Visit (HOSPITAL_COMMUNITY): Payer: Self-pay

## 2022-08-04 ENCOUNTER — Telehealth: Payer: Self-pay | Admitting: Pharmacy Technician

## 2022-08-04 NOTE — Telephone Encounter (Signed)
Oral Oncology Patient Advocate Encounter   Was successful in securing patient a $5,500 grant from [employer] ([employer]) to provide copayment coverage for abiraterone.  This will keep the out of pocket expense at $0.     I have spoken with the patient.    The billing information is as follows and has been shared with [facility].   RxBin: [id number] PCN:  [id number] Member ID: [id number] Group ID: [id number] Dates of Eligibility: 02/12/22 through 08/04/23  Craig Freeman, CPhT-Adv Oncology Pharmacy Patient Advocate [facility] Direct Number: [phone]  Fax: [phone]

## 2022-08-05 ENCOUNTER — Telehealth: Payer: Self-pay | Admitting: *Deleted

## 2022-08-05 ENCOUNTER — Other Ambulatory Visit: Payer: Self-pay

## 2022-08-05 NOTE — Telephone Encounter (Signed)
VM left by Dr Gibson Ramp stating referral for hospice services received - noted pt is under oral chemo. Request clarification.  Noted per chart review - referral placed by Dr Rene Paci.  This RN called to pt's daughterArline Asp and obtained update.  She states pt is taking the zytiga and prednisone daily - and eating fair (he is eating more soft foods). Discussed above referral with Arline Asp stating they would like Palliative Care not full hospice.  This RN called and clarified with Dr Gibson Ramp for initiation of appropriate referral.

## 2022-08-09 DIAGNOSIS — C61 Malignant neoplasm of prostate: Secondary | ICD-10-CM | POA: Diagnosis not present

## 2022-08-09 DIAGNOSIS — D631 Anemia in chronic kidney disease: Secondary | ICD-10-CM | POA: Diagnosis not present

## 2022-08-09 DIAGNOSIS — I129 Hypertensive chronic kidney disease with stage 1 through stage 4 chronic kidney disease, or unspecified chronic kidney disease: Secondary | ICD-10-CM | POA: Diagnosis not present

## 2022-08-09 DIAGNOSIS — C7951 Secondary malignant neoplasm of bone: Secondary | ICD-10-CM | POA: Diagnosis not present

## 2022-08-09 DIAGNOSIS — D630 Anemia in neoplastic disease: Secondary | ICD-10-CM | POA: Diagnosis not present

## 2022-08-09 DIAGNOSIS — N1831 Chronic kidney disease, stage 3a: Secondary | ICD-10-CM | POA: Diagnosis not present

## 2022-08-10 DIAGNOSIS — D631 Anemia in chronic kidney disease: Secondary | ICD-10-CM | POA: Diagnosis not present

## 2022-08-10 DIAGNOSIS — C7951 Secondary malignant neoplasm of bone: Secondary | ICD-10-CM | POA: Diagnosis not present

## 2022-08-10 DIAGNOSIS — D630 Anemia in neoplastic disease: Secondary | ICD-10-CM | POA: Diagnosis not present

## 2022-08-10 DIAGNOSIS — N1831 Chronic kidney disease, stage 3a: Secondary | ICD-10-CM | POA: Diagnosis not present

## 2022-08-10 DIAGNOSIS — C61 Malignant neoplasm of prostate: Secondary | ICD-10-CM | POA: Diagnosis not present

## 2022-08-10 DIAGNOSIS — I129 Hypertensive chronic kidney disease with stage 1 through stage 4 chronic kidney disease, or unspecified chronic kidney disease: Secondary | ICD-10-CM | POA: Diagnosis not present

## 2022-08-11 DIAGNOSIS — D631 Anemia in chronic kidney disease: Secondary | ICD-10-CM | POA: Diagnosis not present

## 2022-08-11 DIAGNOSIS — D630 Anemia in neoplastic disease: Secondary | ICD-10-CM | POA: Diagnosis not present

## 2022-08-11 DIAGNOSIS — N1831 Chronic kidney disease, stage 3a: Secondary | ICD-10-CM | POA: Diagnosis not present

## 2022-08-11 DIAGNOSIS — C61 Malignant neoplasm of prostate: Secondary | ICD-10-CM | POA: Diagnosis not present

## 2022-08-11 DIAGNOSIS — C7951 Secondary malignant neoplasm of bone: Secondary | ICD-10-CM | POA: Diagnosis not present

## 2022-08-11 DIAGNOSIS — I129 Hypertensive chronic kidney disease with stage 1 through stage 4 chronic kidney disease, or unspecified chronic kidney disease: Secondary | ICD-10-CM | POA: Diagnosis not present

## 2022-08-12 DIAGNOSIS — C7951 Secondary malignant neoplasm of bone: Secondary | ICD-10-CM | POA: Diagnosis not present

## 2022-08-12 DIAGNOSIS — D631 Anemia in chronic kidney disease: Secondary | ICD-10-CM | POA: Diagnosis not present

## 2022-08-12 DIAGNOSIS — N1831 Chronic kidney disease, stage 3a: Secondary | ICD-10-CM | POA: Diagnosis not present

## 2022-08-12 DIAGNOSIS — D630 Anemia in neoplastic disease: Secondary | ICD-10-CM | POA: Diagnosis not present

## 2022-08-12 DIAGNOSIS — C61 Malignant neoplasm of prostate: Secondary | ICD-10-CM | POA: Diagnosis not present

## 2022-08-12 DIAGNOSIS — I129 Hypertensive chronic kidney disease with stage 1 through stage 4 chronic kidney disease, or unspecified chronic kidney disease: Secondary | ICD-10-CM | POA: Diagnosis not present

## 2022-08-13 DIAGNOSIS — I129 Hypertensive chronic kidney disease with stage 1 through stage 4 chronic kidney disease, or unspecified chronic kidney disease: Secondary | ICD-10-CM | POA: Diagnosis not present

## 2022-08-13 DIAGNOSIS — C61 Malignant neoplasm of prostate: Secondary | ICD-10-CM | POA: Diagnosis not present

## 2022-08-13 DIAGNOSIS — N1831 Chronic kidney disease, stage 3a: Secondary | ICD-10-CM | POA: Diagnosis not present

## 2022-08-13 DIAGNOSIS — D630 Anemia in neoplastic disease: Secondary | ICD-10-CM | POA: Diagnosis not present

## 2022-08-13 DIAGNOSIS — D631 Anemia in chronic kidney disease: Secondary | ICD-10-CM | POA: Diagnosis not present

## 2022-08-13 DIAGNOSIS — C7951 Secondary malignant neoplasm of bone: Secondary | ICD-10-CM | POA: Diagnosis not present

## 2022-08-16 DIAGNOSIS — N1831 Chronic kidney disease, stage 3a: Secondary | ICD-10-CM | POA: Diagnosis not present

## 2022-08-16 DIAGNOSIS — C61 Malignant neoplasm of prostate: Secondary | ICD-10-CM | POA: Diagnosis not present

## 2022-08-16 DIAGNOSIS — C7951 Secondary malignant neoplasm of bone: Secondary | ICD-10-CM | POA: Diagnosis not present

## 2022-08-16 DIAGNOSIS — I129 Hypertensive chronic kidney disease with stage 1 through stage 4 chronic kidney disease, or unspecified chronic kidney disease: Secondary | ICD-10-CM | POA: Diagnosis not present

## 2022-08-16 DIAGNOSIS — D631 Anemia in chronic kidney disease: Secondary | ICD-10-CM | POA: Diagnosis not present

## 2022-08-16 DIAGNOSIS — D630 Anemia in neoplastic disease: Secondary | ICD-10-CM | POA: Diagnosis not present

## 2022-08-17 ENCOUNTER — Encounter (HOSPITAL_COMMUNITY): Payer: Self-pay | Admitting: Internal Medicine

## 2022-08-17 ENCOUNTER — Other Ambulatory Visit: Payer: Self-pay

## 2022-08-17 ENCOUNTER — Inpatient Hospital Stay (HOSPITAL_COMMUNITY)
Admission: EM | Admit: 2022-08-17 | Discharge: 2022-09-26 | DRG: 542 | Disposition: E | Payer: Medicare Other | Attending: Internal Medicine | Admitting: Internal Medicine

## 2022-08-17 ENCOUNTER — Emergency Department (HOSPITAL_COMMUNITY): Payer: Medicare Other

## 2022-08-17 DIAGNOSIS — D696 Thrombocytopenia, unspecified: Secondary | ICD-10-CM | POA: Diagnosis present

## 2022-08-17 DIAGNOSIS — R630 Anorexia: Secondary | ICD-10-CM | POA: Diagnosis not present

## 2022-08-17 DIAGNOSIS — D630 Anemia in neoplastic disease: Secondary | ICD-10-CM | POA: Diagnosis present

## 2022-08-17 DIAGNOSIS — D649 Anemia, unspecified: Secondary | ICD-10-CM | POA: Diagnosis not present

## 2022-08-17 DIAGNOSIS — R195 Other fecal abnormalities: Secondary | ICD-10-CM | POA: Diagnosis present

## 2022-08-17 DIAGNOSIS — Z7189 Other specified counseling: Secondary | ICD-10-CM

## 2022-08-17 DIAGNOSIS — Z515 Encounter for palliative care: Secondary | ICD-10-CM | POA: Diagnosis not present

## 2022-08-17 DIAGNOSIS — E860 Dehydration: Secondary | ICD-10-CM | POA: Diagnosis present

## 2022-08-17 DIAGNOSIS — R062 Wheezing: Secondary | ICD-10-CM | POA: Diagnosis not present

## 2022-08-17 DIAGNOSIS — R54 Age-related physical debility: Secondary | ICD-10-CM | POA: Diagnosis present

## 2022-08-17 DIAGNOSIS — J984 Other disorders of lung: Secondary | ICD-10-CM | POA: Diagnosis not present

## 2022-08-17 DIAGNOSIS — J9811 Atelectasis: Secondary | ICD-10-CM | POA: Diagnosis not present

## 2022-08-17 DIAGNOSIS — R0682 Tachypnea, not elsewhere classified: Secondary | ICD-10-CM | POA: Diagnosis not present

## 2022-08-17 DIAGNOSIS — C801 Malignant (primary) neoplasm, unspecified: Secondary | ICD-10-CM | POA: Diagnosis not present

## 2022-08-17 DIAGNOSIS — E7800 Pure hypercholesterolemia, unspecified: Secondary | ICD-10-CM | POA: Diagnosis present

## 2022-08-17 DIAGNOSIS — Z79899 Other long term (current) drug therapy: Secondary | ICD-10-CM | POA: Diagnosis not present

## 2022-08-17 DIAGNOSIS — B370 Candidal stomatitis: Secondary | ICD-10-CM | POA: Diagnosis present

## 2022-08-17 DIAGNOSIS — Z880 Allergy status to penicillin: Secondary | ICD-10-CM

## 2022-08-17 DIAGNOSIS — R64 Cachexia: Secondary | ICD-10-CM | POA: Diagnosis present

## 2022-08-17 DIAGNOSIS — K922 Gastrointestinal hemorrhage, unspecified: Secondary | ICD-10-CM | POA: Diagnosis present

## 2022-08-17 DIAGNOSIS — C7951 Secondary malignant neoplasm of bone: Principal | ICD-10-CM | POA: Diagnosis present

## 2022-08-17 DIAGNOSIS — Z86718 Personal history of other venous thrombosis and embolism: Secondary | ICD-10-CM

## 2022-08-17 DIAGNOSIS — Z881 Allergy status to other antibiotic agents status: Secondary | ICD-10-CM | POA: Diagnosis not present

## 2022-08-17 DIAGNOSIS — K921 Melena: Secondary | ICD-10-CM | POA: Diagnosis not present

## 2022-08-17 DIAGNOSIS — N179 Acute kidney failure, unspecified: Secondary | ICD-10-CM | POA: Diagnosis present

## 2022-08-17 DIAGNOSIS — C61 Malignant neoplasm of prostate: Secondary | ICD-10-CM | POA: Diagnosis not present

## 2022-08-17 DIAGNOSIS — J90 Pleural effusion, not elsewhere classified: Secondary | ICD-10-CM | POA: Diagnosis not present

## 2022-08-17 DIAGNOSIS — J189 Pneumonia, unspecified organism: Secondary | ICD-10-CM | POA: Diagnosis present

## 2022-08-17 DIAGNOSIS — R0989 Other specified symptoms and signs involving the circulatory and respiratory systems: Secondary | ICD-10-CM | POA: Diagnosis not present

## 2022-08-17 DIAGNOSIS — Z8546 Personal history of malignant neoplasm of prostate: Secondary | ICD-10-CM | POA: Diagnosis not present

## 2022-08-17 DIAGNOSIS — E785 Hyperlipidemia, unspecified: Secondary | ICD-10-CM | POA: Diagnosis present

## 2022-08-17 DIAGNOSIS — R918 Other nonspecific abnormal finding of lung field: Secondary | ICD-10-CM | POA: Diagnosis not present

## 2022-08-17 DIAGNOSIS — Z1152 Encounter for screening for COVID-19: Secondary | ICD-10-CM | POA: Diagnosis not present

## 2022-08-17 DIAGNOSIS — R509 Fever, unspecified: Secondary | ICD-10-CM | POA: Diagnosis not present

## 2022-08-17 DIAGNOSIS — R1310 Dysphagia, unspecified: Secondary | ICD-10-CM | POA: Diagnosis present

## 2022-08-17 DIAGNOSIS — Z7952 Long term (current) use of systemic steroids: Secondary | ICD-10-CM | POA: Diagnosis not present

## 2022-08-17 DIAGNOSIS — R410 Disorientation, unspecified: Secondary | ICD-10-CM | POA: Diagnosis present

## 2022-08-17 DIAGNOSIS — R4589 Other symptoms and signs involving emotional state: Secondary | ICD-10-CM

## 2022-08-17 DIAGNOSIS — Z9221 Personal history of antineoplastic chemotherapy: Secondary | ICD-10-CM | POA: Diagnosis not present

## 2022-08-17 DIAGNOSIS — Z7901 Long term (current) use of anticoagulants: Secondary | ICD-10-CM | POA: Diagnosis not present

## 2022-08-17 DIAGNOSIS — E876 Hypokalemia: Secondary | ICD-10-CM | POA: Diagnosis present

## 2022-08-17 DIAGNOSIS — Z66 Do not resuscitate: Secondary | ICD-10-CM | POA: Diagnosis not present

## 2022-08-17 DIAGNOSIS — K5730 Diverticulosis of large intestine without perforation or abscess without bleeding: Secondary | ICD-10-CM | POA: Diagnosis not present

## 2022-08-17 DIAGNOSIS — I10 Essential (primary) hypertension: Secondary | ICD-10-CM | POA: Diagnosis present

## 2022-08-17 DIAGNOSIS — D6959 Other secondary thrombocytopenia: Secondary | ICD-10-CM | POA: Diagnosis present

## 2022-08-17 DIAGNOSIS — R1110 Vomiting, unspecified: Secondary | ICD-10-CM | POA: Diagnosis not present

## 2022-08-17 DIAGNOSIS — E538 Deficiency of other specified B group vitamins: Secondary | ICD-10-CM | POA: Diagnosis present

## 2022-08-17 DIAGNOSIS — R058 Other specified cough: Secondary | ICD-10-CM | POA: Diagnosis not present

## 2022-08-17 LAB — CBC WITH DIFFERENTIAL/PLATELET
Abs Immature Granulocytes: 0.38 10*3/uL — ABNORMAL HIGH (ref 0.00–0.07)
Basophils Absolute: 0.1 10*3/uL (ref 0.0–0.1)
Basophils Relative: 1 %
Eosinophils Absolute: 0.1 10*3/uL (ref 0.0–0.5)
Eosinophils Relative: 1 %
HCT: 21.9 % — ABNORMAL LOW (ref 39.0–52.0)
Hemoglobin: 6.7 g/dL — CL (ref 13.0–17.0)
Immature Granulocytes: 6 %
Lymphocytes Relative: 31 %
Lymphs Abs: 2.1 10*3/uL (ref 0.7–4.0)
MCH: 28.6 pg (ref 26.0–34.0)
MCHC: 30.6 g/dL (ref 30.0–36.0)
MCV: 93.6 fL (ref 80.0–100.0)
Monocytes Absolute: 0.6 10*3/uL (ref 0.1–1.0)
Monocytes Relative: 8 %
Neutro Abs: 3.6 10*3/uL (ref 1.7–7.7)
Neutrophils Relative %: 53 %
Platelets: 45 10*3/uL — ABNORMAL LOW (ref 150–400)
RBC: 2.34 MIL/uL — ABNORMAL LOW (ref 4.22–5.81)
RDW: 19.5 % — ABNORMAL HIGH (ref 11.5–15.5)
WBC: 6.8 10*3/uL (ref 4.0–10.5)
nRBC: 24.6 % — ABNORMAL HIGH (ref 0.0–0.2)

## 2022-08-17 LAB — COMPREHENSIVE METABOLIC PANEL
ALT: 32 U/L (ref 0–44)
AST: 76 U/L — ABNORMAL HIGH (ref 15–41)
Albumin: 3.8 g/dL (ref 3.5–5.0)
Alkaline Phosphatase: 59 U/L (ref 38–126)
Anion gap: 12 (ref 5–15)
BUN: 51 mg/dL — ABNORMAL HIGH (ref 8–23)
CO2: 21 mmol/L — ABNORMAL LOW (ref 22–32)
Calcium: 9.4 mg/dL (ref 8.9–10.3)
Chloride: 111 mmol/L (ref 98–111)
Creatinine, Ser: 1.59 mg/dL — ABNORMAL HIGH (ref 0.61–1.24)
GFR, Estimated: 39 mL/min — ABNORMAL LOW (ref >60)
Glucose, Bld: 140 mg/dL — ABNORMAL HIGH (ref 70–99)
Potassium: 3.9 mmol/L (ref 3.5–5.1)
Sodium: 144 mmol/L (ref 135–145)
Total Bilirubin: 1.3 mg/dL — ABNORMAL HIGH (ref 0.3–1.2)
Total Protein: 7.3 g/dL (ref 6.5–8.1)

## 2022-08-17 LAB — RETICULOCYTES
Immature Retic Fract: 40.4 % — ABNORMAL HIGH (ref 2.3–15.9)
RBC.: 2.19 MIL/uL — ABNORMAL LOW (ref 4.22–5.81)
Retic Count, Absolute: 42.9 10*3/uL (ref 19.0–186.0)
Retic Ct Pct: 2 % (ref 0.4–3.1)

## 2022-08-17 LAB — TYPE AND SCREEN
Antibody Screen: NEGATIVE
Unit division: 0

## 2022-08-17 LAB — POC OCCULT BLOOD, ED: Fecal Occult Bld: POSITIVE — AB

## 2022-08-17 LAB — BPAM RBC
Blood Product Expiration Date: 202408192359
ISSUE DATE / TIME: 202407232326
Unit Type and Rh: 9500

## 2022-08-17 LAB — RESP PANEL BY RT-PCR (RSV, FLU A&B, COVID)  RVPGX2
Influenza A by PCR: NEGATIVE
Influenza B by PCR: NEGATIVE
Resp Syncytial Virus by PCR: NEGATIVE
SARS Coronavirus 2 by RT PCR: NEGATIVE

## 2022-08-17 LAB — PREPARE RBC (CROSSMATCH)

## 2022-08-17 MED ORDER — OXYCODONE HCL 5 MG PO TABS
5.0000 mg | ORAL_TABLET | ORAL | Status: DC | PRN
  Administered 2022-08-29: 5 mg via ORAL
  Filled 2022-08-17 (×2): qty 1

## 2022-08-17 MED ORDER — ALBUTEROL SULFATE (2.5 MG/3ML) 0.083% IN NEBU
3.0000 mL | INHALATION_SOLUTION | Freq: Four times a day (QID) | RESPIRATORY_TRACT | Status: DC | PRN
  Administered 2022-08-29: 3 mL via RESPIRATORY_TRACT
  Filled 2022-08-17: qty 3

## 2022-08-17 MED ORDER — ACETAMINOPHEN 650 MG RE SUPP: 650.0000 mg | Freq: Four times a day (QID) | RECTAL | Status: DC | PRN

## 2022-08-17 MED ORDER — ACETAMINOPHEN 325 MG PO TABS
650.0000 mg | ORAL_TABLET | Freq: Four times a day (QID) | ORAL | Status: DC | PRN
  Administered 2022-08-19 – 2022-08-21 (×2): 650 mg via ORAL
  Filled 2022-08-17 (×2): qty 2

## 2022-08-17 MED ORDER — ONDANSETRON HCL 4 MG/2ML IJ SOLN
4.0000 mg | Freq: Four times a day (QID) | INTRAMUSCULAR | Status: DC | PRN
  Filled 2022-08-17: qty 2

## 2022-08-17 MED ORDER — ATORVASTATIN CALCIUM 40 MG PO TABS
40.0000 mg | ORAL_TABLET | Freq: Every day | ORAL | Status: DC
  Administered 2022-08-18 – 2022-08-29 (×11): 40 mg via ORAL
  Filled 2022-08-17 (×13): qty 1

## 2022-08-17 MED ORDER — PREDNISONE 5 MG PO TABS
5.0000 mg | ORAL_TABLET | Freq: Every day | ORAL | Status: DC
  Administered 2022-08-18 – 2022-08-29 (×12): 5 mg via ORAL
  Filled 2022-08-17 (×13): qty 1

## 2022-08-17 MED ORDER — SODIUM CHLORIDE 0.9 % IV BOLUS
500.0000 mL | Freq: Once | INTRAVENOUS | Status: AC
  Administered 2022-08-17: 500 mL via INTRAVENOUS

## 2022-08-17 MED ORDER — ABIRATERONE ACETATE 250 MG PO TABS: 500.0000 mg | ORAL_TABLET | Freq: Every day | ORAL | Status: DC

## 2022-08-17 MED ORDER — MOMETASONE FURO-FORMOTEROL FUM 100-5 MCG/ACT IN AERO
2.0000 | INHALATION_SPRAY | Freq: Two times a day (BID) | RESPIRATORY_TRACT | Status: DC
  Administered 2022-08-18 – 2022-09-02 (×30): 2 via RESPIRATORY_TRACT
  Filled 2022-08-17: qty 8.8

## 2022-08-17 MED ORDER — SENNOSIDES-DOCUSATE SODIUM 8.6-50 MG PO TABS: 1.0000 | ORAL_TABLET | Freq: Every evening | ORAL | Status: DC | PRN

## 2022-08-17 MED ORDER — SODIUM CHLORIDE 0.9% IV SOLUTION: Freq: Once | INTRAVENOUS | Status: AC

## 2022-08-17 MED ORDER — ONDANSETRON HCL 4 MG PO TABS
4.0000 mg | ORAL_TABLET | Freq: Four times a day (QID) | ORAL | Status: DC | PRN
  Administered 2022-08-26: 4 mg via ORAL
  Filled 2022-08-17: qty 1

## 2022-08-17 NOTE — ED Triage Notes (Signed)
Pt arrives with family who reports cough and decreased appetite over the last couple days. Pt was recently started to on a new cancer medication, zytiga and prednisone.

## 2022-08-17 NOTE — ED Provider Notes (Signed)
[place] EMERGENCY DEPARTMENT AT [facility] Provider Note   CSN: [id number] Arrival date & time: 08/17/22  1814     History  Chief Complaint  Patient presents with   Anorexia   Cough    Craig Freeman is a 89 y.o. male history of metastatic prostate cancer, DVT of Coumadin, here presenting with cough and weakness.  Patient lives at home with family.  Patient has been noted to be weaker than usual.  Patient has poor appetite and stopped eating.  Patient usually walks with a walker but currently cannot walk even with a walker.  Patient also has some cough as well.  Denies any sick contacts.  The history is provided by the patient and a relative.       Home Medications Prior to Admission medications   Medication Sig Start Date End Date Taking? Authorizing Provider  abiraterone acetate (ZYTIGA) 250 MG tablet Take 2 tablets (500 mg total) by mouth daily. Take on an empty stomach 1 hour before or 2 hours after a meal 07/30/22   Iruku, Burnice Logan, MD  albuterol (PROVENTIL HFA;VENTOLIN HFA) 108 (90 BASE) MCG/ACT inhaler Inhale 1-2 puffs into the lungs every 6 (six) hours as needed for wheezing or shortness of breath. Shortness of breath    [provider]  atenolol (TENORMIN) 50 MG tablet Take 50 mg by mouth daily. 02/17/18   [provider]  atorvastatin (LIPITOR) 40 MG tablet Take 40 mg by mouth daily.    [provider]  Calcium Carb-Cholecalciferol (CALCIUM 600+D3 PO) Take 1 tablet by mouth 2 (two) times daily with a meal.    [provider]  [facility] MELATONIN 5 MG TABS Take 5 mg by mouth at bedtime as needed (for sleep). 09/15/17   [provider]  denosumab (XGEVA) 120 MG/1.7ML SOLN injection Inject 120 mg into the skin every 30 (thirty) days.    [provider]  ferrous sulfate 325 (65 FE) MG tablet Take 325 mg by mouth 2 (two) times daily with a meal.    [provider]  fluticasone (FLONASE) 50 MCG/ACT nasal spray  Place 2 sprays into both nostrils daily. 12/20/17   [provider]  levocetirizine (XYZAL) 5 MG tablet Take 5 mg by mouth every evening. 10/05/17   [provider]  losartan (COZAAR) 50 MG tablet Take 50 mg by mouth daily.    [provider]  megestrol (MEGACE) 20 MG tablet Take 20 mg by mouth at bedtime as needed (for hot flashes).    [provider]  ondansetron (ZOFRAN) 4 MG tablet Take 1 tablet (4 mg total) by mouth every 6 (six) hours as needed for nausea. 02/08/22   Rai, Delene Ruffini, MD  polyethylene glycol (MIRALAX) 17 g packet Take 17 g by mouth daily as needed for mild constipation or moderate constipation (also available OTC.). 07/18/22   Meredeth Ide, MD  predniSONE (DELTASONE) 5 MG tablet Take 1 tablet (5 mg total) by mouth daily with breakfast. 07/28/22   Rachel Moulds, MD  SYSTANE COMPLETE PF 0.6 % SOLN Place 1 drop into both eyes in the morning.    [provider]  triazolam (HALCION) 0.125 MG tablet Take 0.125 mg by mouth at bedtime as needed for sleep.    [provider]  Monte Fantasia INHUB 100-50 MCG/ACT AEPB Inhale 1 puff into the lungs 2 (two) times daily.    [provider]      Allergies    Amoxicillin, Augmentin [amoxicillin-pot  clavulanate], and Zithromax [azithromycin]    Review of Systems   Review of Systems  Respiratory:  Positive for cough.   Neurological:  Positive for weakness.  All other systems reviewed and are negative.   Physical Exam Updated Vital Signs BP 120/63 (BP Location: Right Arm)   Pulse 90   Temp 99.1 F (37.3 C)   Resp (!) 24   Ht 5\' 4"  (1.626 m)   Wt 59 kg   SpO2 98%   BMI 22.31 kg/m  Physical Exam Vitals and nursing note reviewed.  Constitutional:      Comments: Chronically ill  HENT:     Head: Normocephalic.     Nose: Nose normal.     Mouth/Throat:     Mouth: Mucous membranes are dry.  Eyes:     Extraocular Movements: Extraocular movements intact.     Pupils: Pupils are  equal, round, and reactive to light.  Cardiovascular:     Rate and Rhythm: Normal rate and regular rhythm.     Pulses: Normal pulses.     Heart sounds: Normal heart sounds.  Pulmonary:     Effort: Pulmonary effort is normal.     Breath sounds: Normal breath sounds.  Abdominal:     General: Abdomen is flat.     Palpations: Abdomen is soft.  Genitourinary:    Comments: Rectal- brown stool  Musculoskeletal:        General: Normal range of motion.     Cervical back: Normal range of motion and neck supple.  Skin:    General: Skin is warm.     Capillary Refill: Capillary refill takes less than 2 seconds.  Neurological:     Comments: Demented but moving all extremities  Psychiatric:        Mood and Affect: Mood normal.     ED Results / Procedures / Treatments   Labs (all labs ordered are listed, but only abnormal results are displayed) Labs Reviewed  CBC WITH DIFFERENTIAL/PLATELET - Abnormal; Notable for the following components:      Result Value   RBC 2.34 (*)    Hemoglobin 6.7 (*)    HCT 21.9 (*)    RDW 19.5 (*)    Platelets 45 (*)    nRBC 24.6 (*)    Abs Immature Granulocytes 0.38 (*)    All other components within normal limits  COMPREHENSIVE METABOLIC PANEL - Abnormal; Notable for the following components:   CO2 21 (*)    Glucose, Bld 140 (*)    BUN 51 (*)    Creatinine, Ser 1.59 (*)    AST 76 (*)    Total Bilirubin 1.3 (*)    GFR, Estimated 39 (*)    All other components within normal limits  POC OCCULT BLOOD, ED - Abnormal; Notable for the following components:   Fecal Occult Bld POSITIVE (*)    All other components within normal limits  RESP PANEL BY RT-PCR (RSV, FLU A&B, COVID)  RVPGX2  VITAMIN B12  FOLATE  IRON AND TIBC  FERRITIN  RETICULOCYTES  TYPE AND SCREEN  PREPARE RBC (CROSSMATCH)    EKG None  Radiology DG Chest Portable 1 View  Result Date: 08/17/2022 CLINICAL DATA:  Productive cough with loss of appetite. Additional history of prostate  cancer with bone metastases EXAM: PORTABLE CHEST 1 VIEW COMPARISON:  Chest CT with contrast 10/14/2020 FINDINGS: The heart size and mediastinal contours are within normal limits. There is patchy aortic calcific plaque. Both lungs are clear of infiltrates  with mild chronic interstitial changes in the bases. On the right there are calcified axillary lymph nodes, acromiohumeral abutment consistent with chronic rotator cuff arthropathy with a likely degenerative tear, and a sclerotic lesion in the proximal humeral shaft not seen previously and probably a metastasis. There are suspected small scattered sclerotic metastases in the ribs. The vertebra are not well seen. There is mild dextroscoliosis with advanced thoracic spondylosis. IMPRESSION: 1. No evidence of acute chest disease.  Chronic change in the bases. 2. Aortic atherosclerosis. 3. Sclerotic lesion in the proximal right humeral shaft not seen previously and probably a metastasis, with suspected few small scattered sclerotic metastases in the ribs. Electronically Signed   By: Almira Bar M.D.   On: 08/17/2022 20:20    Procedures Procedures    CRITICAL CARE Performed by: Richardean Canal   Total critical care time: 33 minutes  Critical care time was exclusive of separately billable procedures and treating other patients.  Critical care was necessary to treat or prevent imminent or life-threatening deterioration.  Critical care was time spent personally by me on the following activities: development of treatment plan with patient and/or surrogate as well as nursing, discussions with consultants, evaluation of patient's response to treatment, examination of patient, obtaining history from patient or surrogate, ordering and performing treatments and interventions, ordering and review of laboratory studies, ordering and review of radiographic studies, pulse oximetry and re-evaluation of patient's condition.   Medications Ordered in ED Medications   0.9 %  sodium chloride infusion (Manually program via Guardrails IV Fluids) (has no administration in time range)  sodium chloride 0.9 % bolus 500 mL (has no administration in time range)    ED Course/ Medical Decision Making/ A&P                             Medical Decision Making Craig Freeman is a 89 y.o. male here presenting with weakness and cough.  Patient appears very pale.  Concern for symptomatic anemia.  Patient also stopped eating so I am concerned that he may have electrolyte abnormalities such as hypokalemia or acute renal failure.  Plan to get CBC and CMP  9:29 PM Patient CBC showed hemoglobin of 6.7.  Patient's guaiac is faintly positive.  Chest x-ray showed pneumonia.  COVID test is negative.  Discussed with family regarding transfusion.  Patient had received transfusion previously then consented to transfusion.  Will also send off anemia panel.  I secure chat with Dr. Levora Angel [facility]).  Patient previously saw Dr. Bosie Clos in the hospital.  Hospitalist to admit for symptomatic anemia and mild AKI.   Problems Addressed: AKI (acute kidney injury) (HCC): acute illness or injury Anemia, unspecified type: acute illness or injury  Amount and/or Complexity of Data Reviewed Labs: ordered. Decision-making details documented in ED Course.  Risk Prescription drug management. Decision regarding hospitalization.    Final Clinical Impression(s) / ED Diagnoses Final diagnoses:  None    Rx / DC Orders ED Discharge Orders     None         Charlynne Pander, MD 08/17/22 2130

## 2022-08-17 NOTE — ED Notes (Signed)
ED TO INPATIENT HANDOFF REPORT  ED Nurse Name and Phone #:   Leatrice Jewels [phone]  S Name/Age/Gender Craig Freeman 89 y.o. male Room/Bed: WA02/WA02  Code Status   Code Status: Prior  Home/SNF/Other Home Patient oriented to: self Is this baseline? Yes   Triage Complete: Triage complete  Chief Complaint Symptomatic anemia [D64.9]  Triage Note Pt arrives with family who reports cough and decreased appetite over the last couple days. Pt was recently started to on a new cancer medication, zytiga and prednisone.    Allergies Allergies  Allergen Reactions   Amoxicillin Diarrhea   Augmentin [Amoxicillin-Pot Clavulanate] Diarrhea   Zithromax [Azithromycin] Other (See Comments)    "Blood in the stools"    Level of Care/Admitting Diagnosis ED Disposition     ED Disposition  Admit   Condition  --   Comment  Hospital Area: [facility] [[id number]]  Level of Care: Med-Surg [16]  May place patient in observation at [facility] or [facility] if equivalent level of care is available:: No  Covid Evaluation: Confirmed COVID Negative  Diagnosis: Symptomatic anemia [[id number]]  Admitting Physician: Charlsie Quest [[id number]]  Attending Physician: Charlsie Quest [[id number]]          B Medical/Surgery History Past Medical History:  Diagnosis Date   Cancer (HCC)    Constipation    DVT (deep venous thrombosis), right    coumadin   High cholesterol    Hyperlipidemia    Hypertension    Prostate cancer metastatic to bone (HCC) 12/30/2021   Past Surgical History:  Procedure Laterality Date   JOINT REPLACEMENT       A IV Location/Drains/Wounds Patient Lines/Drains/Airways Status     Active Line/Drains/Airways     Name Placement date Placement time Site Days   Peripheral IV 08/17/22 20 G Posterior;Right Forearm 08/17/22  2128  Forearm  less than 1   Peripheral IV 08/17/22 20 G Left Antecubital 08/17/22  2136  Antecubital  less than 1             Intake/Output Last 24 hours No intake or output data in the 24 hours ending 08/17/22 2212  Labs/Imaging Results for orders placed or performed during the hospital encounter of 08/17/22 (from the past 48 hour(s))  Resp panel by RT-PCR (RSV, Flu A&B, Covid) Anterior Nasal Swab     Status: None   Collection Time: 08/17/22  7:14 PM   Specimen: Anterior Nasal Swab  Result Value Ref Range   SARS Coronavirus 2 by RT PCR NEGATIVE NEGATIVE    Comment: (NOTE) SARS-CoV-2 target nucleic acids are NOT DETECTED.  The SARS-CoV-2 RNA is generally detectable in upper respiratory specimens during the acute phase of infection. The lowest concentration of SARS-CoV-2 viral copies this assay can detect is 138 copies/mL. A negative result does not preclude SARS-Cov-2 infection and should not be used as the sole basis for treatment or other patient management decisions. A negative result may occur with  improper specimen collection/handling, submission of specimen other than nasopharyngeal swab, presence of viral mutation(s) within the areas targeted by this assay, and inadequate number of viral copies(<138 copies/mL). A negative result must be combined with clinical observations, patient history, and epidemiological information. The expected result is Negative.  Fact Sheet for Patients:  BloggerCourse.com  Fact Sheet for Healthcare Providers:  SeriousBroker.it  This test is no t yet approved or cleared by the [place] FDA and  has been authorized for detection and/or diagnosis of SARS-CoV-2  by FDA under an Emergency Use Authorization (EUA). This EUA will remain  in effect (meaning this test can be used) for the duration of the COVID-19 declaration under Section 564(b)(1) of the Act, 21 U.S.C.section 360bbb-3(b)(1), unless the authorization is terminated  or revoked sooner.       Influenza A by PCR NEGATIVE NEGATIVE   Influenza B by PCR  NEGATIVE NEGATIVE    Comment: (NOTE) The Xpert Xpress SARS-CoV-2/FLU/RSV plus assay is intended as an aid in the diagnosis of influenza from Nasopharyngeal swab specimens and should not be used as a sole basis for treatment. Nasal washings and aspirates are unacceptable for Xpert Xpress SARS-CoV-2/FLU/RSV testing.  Fact Sheet for Patients: BloggerCourse.com  Fact Sheet for Healthcare Providers: SeriousBroker.it  This test is not yet approved or cleared by the [place] FDA and has been authorized for detection and/or diagnosis of SARS-CoV-2 by FDA under an Emergency Use Authorization (EUA). This EUA will remain in effect (meaning this test can be used) for the duration of the COVID-19 declaration under Section 564(b)(1) of the Act, 21 U.S.C. section 360bbb-3(b)(1), unless the authorization is terminated or revoked.     Resp Syncytial Virus by PCR NEGATIVE NEGATIVE    Comment: (NOTE) Fact Sheet for Patients: BloggerCourse.com  Fact Sheet for Healthcare Providers: SeriousBroker.it  This test is not yet approved or cleared by the [place] FDA and has been authorized for detection and/or diagnosis of SARS-CoV-2 by FDA under an Emergency Use Authorization (EUA). This EUA will remain in effect (meaning this test can be used) for the duration of the COVID-19 declaration under Section 564(b)(1) of the Act, 21 U.S.C. section 360bbb-3(b)(1), unless the authorization is terminated or revoked.  Performed at [facility], [address] [place], [place] [address]   CBC with Differential     Status: Abnormal   Collection Time: 08/17/22  7:50 PM  Result Value Ref Range   WBC 6.8 4.0 - 10.5 K/uL    Comment: ADJUSTED FOR NUCLEATED RBC'S   RBC 2.34 (L) 4.22 - 5.81 MIL/uL   Hemoglobin 6.7 (LL) 13.0 - 17.0 g/dL    Comment: REPEATED TO VERIFY THIS CRITICAL RESULT  HAS VERIFIED AND BEEN CALLED TO T.RIVERS, RN BY NATHAN THOMPSON ON 07 23 2024 AT 2028, AND HAS BEEN READ BACK. CRITICAL RESULT VERIFIED    HCT 21.9 (L) 39.0 - 52.0 %   MCV 93.6 80.0 - 100.0 fL   MCH 28.6 26.0 - 34.0 pg   MCHC 30.6 30.0 - 36.0 g/dL   RDW 44.0 (H) 10.2 - 72.5 %   Platelets 45 (L) 150 - 400 K/uL    Comment: SPECIMEN CHECKED FOR CLOTS Immature Platelet Fraction may be clinically indicated, consider ordering this additional test [id number] REPEATED TO VERIFY PLATELET COUNT CONFIRMED BY SMEAR    nRBC 24.6 (H) 0.0 - 0.2 %   Neutrophils Relative % 53 %   Neutro Abs 3.6 1.7 - 7.7 K/uL   Lymphocytes Relative 31 %   Lymphs Abs 2.1 0.7 - 4.0 K/uL   Monocytes Relative 8 %   Monocytes Absolute 0.6 0.1 - 1.0 K/uL   Eosinophils Relative 1 %   Eosinophils Absolute 0.1 0.0 - 0.5 K/uL   Basophils Relative 1 %   Basophils Absolute 0.1 0.0 - 0.1 K/uL   WBC Morphology Mild Left Shift (1-5% metas, occ myelo)     Comment: VACUOLATED NEUTROPHILS   RBC Morphology NRBC'S PRESENT    Immature Granulocytes 6 %   Abs  Immature Granulocytes 0.38 (H) 0.00 - 0.07 K/uL   Acanthocytes PRESENT    Schistocytes PRESENT    Tear Drop Cells PRESENT    Burr Cells PRESENT    Polychromasia PRESENT    Ovalocytes PRESENT     Comment: Performed at [facility], [address] [place], [place] [address]  Comprehensive metabolic panel     Status: Abnormal   Collection Time: 08/17/22  7:50 PM  Result Value Ref Range   Sodium 144 135 - 145 mmol/L   Potassium 3.9 3.5 - 5.1 mmol/L   Chloride 111 98 - 111 mmol/L   CO2 21 (L) 22 - 32 mmol/L   Glucose, Bld 140 (H) 70 - 99 mg/dL    Comment: Glucose reference range applies only to samples taken after fasting for at least 8 hours.   BUN 51 (H) 8 - 23 mg/dL   Creatinine, Ser 6.04 (H) 0.61 - 1.24 mg/dL   Calcium 9.4 8.9 - 54.0 mg/dL   Total Protein 7.3 6.5 - 8.1 g/dL   Albumin 3.8 3.5 - 5.0 g/dL   AST 76 (H) 15 - 41 U/L   ALT 32 0 - 44 U/L    Alkaline Phosphatase 59 38 - 126 U/L   Total Bilirubin 1.3 (H) 0.3 - 1.2 mg/dL   GFR, Estimated 39 (L) >60 mL/min    Comment: (NOTE) Calculated using the CKD-EPI Creatinine Equation (2021)    Anion gap 12 5 - 15    Comment: Performed at [facility], [address], [place], [place] [address]  POC occult blood, ED     Status: Abnormal   Collection Time: 08/17/22  9:16 PM  Result Value Ref Range   Fecal Occult Bld POSITIVE (A) NEGATIVE  Type and screen     Status: None (Preliminary result)   Collection Time: 08/17/22  9:38 PM  Result Value Ref Range   ABO/RH(D) PENDING    Antibody Screen PENDING    Sample Expiration      08/20/2022,2359 Performed at [facility], [address] [place], [place] [address]   Reticulocytes     Status: Abnormal   Collection Time: 08/17/22  9:38 PM  Result Value Ref Range   Retic Ct Pct 2.0 0.4 - 3.1 %   RBC. 2.19 (L) 4.22 - 5.81 MIL/uL   Retic Count, Absolute 42.9 19.0 - 186.0 K/uL   Immature Retic Fract 40.4 (H) 2.3 - 15.9 %    Comment: Performed at [facility], [address], [place], [place] [address]   DG Chest Portable 1 View  Result Date: 08/17/2022 CLINICAL DATA:  Productive cough with loss of appetite. Additional history of prostate cancer with bone metastases EXAM: PORTABLE CHEST 1 VIEW COMPARISON:  Chest CT with contrast 10/14/2020 FINDINGS: The heart size and mediastinal contours are within normal limits. There is patchy aortic calcific plaque. Both lungs are clear of infiltrates with mild chronic interstitial changes in the bases. On the right there are calcified axillary lymph nodes, acromiohumeral abutment consistent with chronic rotator cuff arthropathy with a likely degenerative tear, and a sclerotic lesion in the proximal humeral shaft not seen previously and probably a metastasis. There are suspected small scattered sclerotic metastases in the ribs. The vertebra are not well  seen. There is mild dextroscoliosis with advanced thoracic spondylosis. IMPRESSION: 1. No evidence of acute chest disease.  Chronic change in the bases. 2. Aortic atherosclerosis. 3. Sclerotic lesion in the proximal right humeral shaft  not seen previously and probably a metastasis, with suspected few small scattered sclerotic metastases in the ribs. Electronically Signed   By: Almira Bar M.D.   On: 08/17/2022 20:20    Pending Labs Unresulted Labs (From admission, onward)     Start     Ordered   08/17/22 2111  Prepare RBC (crossmatch)  (Blood Administration Adult)  Once,   R       Question Answer Comment  # of Units 1 unit   Transfusion Indications Hemoglobin < 7 gm/dL and symptomatic   Number of Units to Keep Ahead NO units ahead   If emergent release call blood bank Not emergent release      08/17/22 2110   08/17/22 2111  Vitamin B12  (Anemia Panel (PNL))  Once,   URGENT        08/17/22 2110   08/17/22 2111  Folate  (Anemia Panel (PNL))  Once,   URGENT        08/17/22 2110   08/17/22 2111  Iron and TIBC  (Anemia Panel (PNL))  Once,   URGENT        08/17/22 2110   08/17/22 2111  Ferritin  (Anemia Panel (PNL))  Once,   URGENT        08/17/22 2110            Vitals/Pain Today's Vitals   08/17/22 2054 08/17/22 2105 08/17/22 2150 08/17/22 2200  BP:  (!) 124/57 127/65 134/63  Pulse: 90 91 90 82  Resp: (!) 24 (!) 26 16 18   Temp:      TempSrc:      SpO2: 98% 98% 97% 98%  Weight:      Height:      PainSc:        Isolation Precautions No active isolations  Medications Medications  0.9 %  sodium chloride infusion (Manually program via Guardrails IV Fluids) (has no administration in time range)  sodium chloride 0.9 % bolus 500 mL (500 mLs Intravenous New Bag/Given 08/17/22 2129)    Mobility walks with device     Focused Assessments     R Recommendations: See Admitting Provider Note  Report given to:   Additional Notes:

## 2022-08-17 NOTE — ED Provider Triage Note (Signed)
Emergency Medicine Provider Triage Evaluation Note  Craig Freeman , a 89 y.o. male  was evaluated in triage.  Patient daughter at bedside and helps to provide the history. Patient has active prostate cancer with metastasis to the bone, currently on Zytega followed by Dr. Al Pimple.   Patient has had progressive loss of appetite over the past couple of weeks, now not eating anything today. Denies any fevers or chills. Also noted to have a rattling cough for 1 week. No history of lung disease.   Review of Systems  Positive: As above Negative: As above  Physical Exam  BP 120/63 (BP Location: Right Arm)   Pulse 100   Temp 99.1 F (37.3 C)   Resp 18   Ht 5\' 4"  (1.626 m)   Wt 59 kg   SpO2 100%   BMI 22.31 kg/m  Gen:   Sleepy appearing, cooperative, comfortable appearing  Resp:  Normal effort  MSK:   Moves extremities without difficulty  Other:  Murmur appreciated on cardiac auscultation Dry mucous membranes  Medical Decision Making  Medically screening exam initiated at 7:11 PM.  Appropriate orders placed.  Craig Freeman was informed that the remainder of the evaluation will be completed by another provider, this initial triage assessment does not replace that evaluation, and the importance of remaining in the ED until their evaluation is complete.     Arabella Merles, PA-C 08/17/22 1915

## 2022-08-17 NOTE — Hospital Course (Addendum)
Craig Freeman is a 89 y.o. male with a history of metastatic prostate cancer to bone, chronic anemia, chronic thrombocytopenia, hypertension, hyperlipidemia, DVT.  Patient presented secondary to weakness and fatigue and was found to have evidence of symptomatic anemia requiring multiple blood transfusions. Now plan for in-hospital death.

## 2022-08-17 NOTE — Progress Notes (Signed)
Prior-To-Admission Oral Chemotherapy for Treatment of Oncologic Disease   Order noted from Dr. Allena Katz to continue prior-to-admission oral chemotherapy regimen of abiraterone  Procedure Per Pharmacy & Therapeutics Committee Policy: Orders for continuation of home oral chemotherapy for treatment of an oncologic disease will be held unless approved by an oncologist during current admission.    For patients receiving oncology care at [facility], inpatient pharmacist contacts patient's oncologist during regular office hours to review. If earlier review is medically necessary, attending physician consults [facility] on-call oncologist   For patients receiving oncology care outside of [facility], attending physician consults patient's oncologist to review. If this oncologist or their coverage cannot be reached, attending physician consults [facility] on-call oncologist   Oral chemotherapy continuation order is on hold pending oncologist review, [facility] oncologist Dr. Al Pimple will be notified by inpatient pharmacy during office hours     Arley Phenix RPh 08/17/2022, 11:27 PM

## 2022-08-17 NOTE — H&P (Signed)
History and Physical    Craig Freeman ZOX:[id number] DOB: October 28, 1924 DOA: 08/17/2022  PCP: Georgann Housekeeper, MD  Patient coming from: Home  I have personally briefly reviewed patient's old medical records in [facility]  Chief Complaint: Weakness, fatigue  HPI: Craig Freeman is a 89 y.o. male with medical history significant for metastatic prostate cancer to bone on abiraterone, chronic anemia and thrombocytopenia, HTN, HLD, remote DVT off anticoagulation due to GI bleeding who presented to the ED for evaluation of fatigue and generalized weakness.  Patient presenting to the ED for evaluation of generalized weakness, fatigue, decreased appetite over the last couple days.  He has had some lightheadedness and dizziness.  Denies any obvious visible bleeding.  Normally ambulates with cane or walker but has decreased ability lately.  Also has been having occasional dry cough and clear rhinorrhea.  He is no longer on any anticoagulation due to GI bleed last month.  ED Course  Labs/Imaging on admission: I have personally reviewed following labs and imaging studies.  Initial vitals showed BP 116/60, pulse 108, RR 18, temp 97.4 F, SpO2 99% on room air.  Labs show hemoglobin 6.7 (baseline between 8-9), platelets 45,000, WBC 6.8, sodium 144, potassium 3.9, bicarb 21, BUN 51, creatinine 1.59, serum glucose 140.  FOBT weakly positive per EDP.  SARS-CoV-2, influenza, RSV PCR negative.  Anemia panel in process.  Portable chest x-ray negative for focal consolidation, edema, effusion.  Sclerotic lesion in the proximal right humeral shaft new compared to prior study with suspected few small scattered sclerotic metastases in the ribs.  Patient was given 500 cc normal saline in order to receive 1 unit PRBC transfusion.  EDP notified [facility] Dr. Levora Angel.  The hospitalist service was consulted to admit for further evaluation and management.  Review of Systems: All systems reviewed and are  negative except as documented in history of present illness above.   Past Medical History:  Diagnosis Date   Cancer (HCC)    Constipation    DVT (deep venous thrombosis), right    coumadin   High cholesterol    Hyperlipidemia    Hypertension    Prostate cancer metastatic to bone (HCC) 12/30/2021    Past Surgical History:  Procedure Laterality Date   JOINT REPLACEMENT      Social History:  reports that he has never smoked. He has never used smokeless tobacco. He reports that he does not drink alcohol and does not use drugs.  Allergies  Allergen Reactions   Amoxicillin Diarrhea   Augmentin [Amoxicillin-Pot Clavulanate] Diarrhea   Zithromax [Azithromycin] Other (See Comments)    "Blood in the stools"    History reviewed. No pertinent family history.   Prior to Admission medications   Medication Sig Start Date End Date Taking? Authorizing Provider  abiraterone acetate (ZYTIGA) 250 MG tablet Take 2 tablets (500 mg total) by mouth daily. Take on an empty stomach 1 hour before or 2 hours after a meal 07/30/22   Iruku, Burnice Logan, MD  albuterol (PROVENTIL HFA;VENTOLIN HFA) 108 (90 BASE) MCG/ACT inhaler Inhale 1-2 puffs into the lungs every 6 (six) hours as needed for wheezing or shortness of breath. Shortness of breath    [provider]  atenolol (TENORMIN) 50 MG tablet Take 50 mg by mouth daily. 02/17/18   [provider]  atorvastatin (LIPITOR) 40 MG tablet Take 40 mg by mouth daily.    [provider]  Calcium Carb-Cholecalciferol (CALCIUM 600+D3 PO) Take 1 tablet by mouth 2 (  two) times daily with a meal.    [provider]  [employer] MELATONIN 5 MG TABS Take 5 mg by mouth at bedtime as needed (for sleep). 09/15/17   [provider]  denosumab (XGEVA) 120 MG/1.7ML SOLN injection Inject 120 mg into the skin every 30 (thirty) days.    [provider]  ferrous sulfate 325 (65 FE) MG tablet Take 325 mg by mouth 2 (two) times daily with a  meal.    [provider]  fluticasone (FLONASE) 50 MCG/ACT nasal spray Place 2 sprays into both nostrils daily. 12/20/17   [provider]  levocetirizine (XYZAL) 5 MG tablet Take 5 mg by mouth every evening. 10/05/17   [provider]  losartan (COZAAR) 50 MG tablet Take 50 mg by mouth daily.    [provider]  megestrol (MEGACE) 20 MG tablet Take 20 mg by mouth at bedtime as needed (for hot flashes).    [provider]  ondansetron (ZOFRAN) 4 MG tablet Take 1 tablet (4 mg total) by mouth every 6 (six) hours as needed for nausea. 02/08/22   Rai, Delene Ruffini, MD  polyethylene glycol (MIRALAX) 17 g packet Take 17 g by mouth daily as needed for mild constipation or moderate constipation (also available OTC.). 07/18/22   Meredeth Ide, MD  predniSONE (DELTASONE) 5 MG tablet Take 1 tablet (5 mg total) by mouth daily with breakfast. 07/28/22   Rachel Moulds, MD  SYSTANE COMPLETE PF 0.6 % SOLN Place 1 drop into both eyes in the morning.    [provider]  triazolam (HALCION) 0.125 MG tablet Take 0.125 mg by mouth at bedtime as needed for sleep.    [provider]  Monte Fantasia INHUB 100-50 MCG/ACT AEPB Inhale 1 puff into the lungs 2 (two) times daily.    [provider]    Physical Exam: Vitals:   08/17/22 2150 08/17/22 2200 08/17/22 2215 08/17/22 2230  BP: 127/65 134/63 136/63 129/63  Pulse: 90 82 85 83  Resp: 16 18 (!) 21 (!) 28  Temp:      TempSrc:      SpO2: 97% 98% 99% 99%  Weight:      Height:       Constitutional: Thin elderly man resting in bed.  NAD, calm, comfortable Eyes: PERRL, lids and conjunctivae normal ENMT: Mucous membranes are dry. Posterior pharynx clear of any exudate or lesions.Normal dentition.  Neck: normal, supple, no masses. Respiratory: clear to auscultation bilaterally, no wheezing, no crackles. Normal respiratory effort. No accessory muscle use.  Cardiovascular: Regular rate and rhythm, no murmurs /  rubs / gallops. No extremity edema. 2+ pedal pulses. Abdomen: no tenderness, no masses palpated.  Musculoskeletal: no clubbing / cyanosis. No joint deformity upper and lower extremities. Good ROM, no contractures. Normal muscle tone.  Skin: no rashes, ulcers. No induration Neurologic: Sensation intact. Strength 5/5 in all 4.  Psychiatric: Alert and oriented x 3. Normal mood.   EKG: Not performed.  Assessment/Plan Principal Problem:   Symptomatic anemia Active Problems:   AKI (acute kidney injury) (HCC)   Hypertension   Prostate cancer metastatic to bone (HCC)   Hyperlipidemia   Thrombocytopenia (HCC)   Craig Freeman is a 89 y.o. 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  Assessment and Plan: Symptomatic acute on chronic anemia:  Anemia likely multifactorial from slow GI bleed and metastatic disease.  FOBT weakly positive.  No longer on any blood thinners. -Transfuse 1 unit PRBC -Follow anemia panel  Acute kidney injury: Creatinine 1.59 on admission compared to baseline 1.1-1.2.  Hold losartan.  Continue with blood transfusion and repeat labs in AM.  Chronic thrombocytopenia: Platelets at recent baseline.  No indication for transfusion at time of admission.  Metastatic prostate cancer to bone: Follows with oncology Dr. Al Pimple.  On active management with Zytiga, prednisone, and Xgeva.  Daughter states that patient is still taking Nubeqa although appears this may have been discontinued by her oncologist on last visit.  Hypertension: Currently normotensive.  Can resume home meds as warranted.  Hyperlipidemia: Continue atorvastatin.   DVT prophylaxis: SCDs Start: 08/17/22 2237 Code Status: DNR, confirmed with patient and his daughter on admission Family Communication:  Daughter and son-in-law at bedside Disposition Plan: From home, dispo pending clinical progress. Consults called: EDP notified [facility] Severity of Illness: The appropriate patient status for this patient is OBSERVATION. Observation status is judged to be reasonable and necessary in order to provide the required intensity of service to ensure the patient's safety. The patient's presenting symptoms, physical exam findings, and initial radiographic and laboratory data in the context of their medical condition is felt to place them at decreased risk for further clinical deterioration. Furthermore, it is anticipated that the patient will be medically stable for discharge from the hospital within 2 midnights of admission.   Darreld Mclean MD [facility]  If 7PM-7AM, please contact night-coverage [email or web address]  08/17/2022, 10:56 PM

## 2022-08-18 ENCOUNTER — Other Ambulatory Visit (HOSPITAL_COMMUNITY): Payer: Self-pay

## 2022-08-18 DIAGNOSIS — E7800 Pure hypercholesterolemia, unspecified: Secondary | ICD-10-CM | POA: Diagnosis present

## 2022-08-18 DIAGNOSIS — K922 Gastrointestinal hemorrhage, unspecified: Secondary | ICD-10-CM | POA: Diagnosis present

## 2022-08-18 DIAGNOSIS — D630 Anemia in neoplastic disease: Secondary | ICD-10-CM | POA: Diagnosis present

## 2022-08-18 DIAGNOSIS — R062 Wheezing: Secondary | ICD-10-CM | POA: Diagnosis not present

## 2022-08-18 DIAGNOSIS — Z7901 Long term (current) use of anticoagulants: Secondary | ICD-10-CM | POA: Diagnosis not present

## 2022-08-18 DIAGNOSIS — B370 Candidal stomatitis: Secondary | ICD-10-CM | POA: Diagnosis present

## 2022-08-18 DIAGNOSIS — Z79899 Other long term (current) drug therapy: Secondary | ICD-10-CM | POA: Diagnosis not present

## 2022-08-18 DIAGNOSIS — K921 Melena: Secondary | ICD-10-CM | POA: Diagnosis not present

## 2022-08-18 DIAGNOSIS — R0989 Other specified symptoms and signs involving the circulatory and respiratory systems: Secondary | ICD-10-CM | POA: Diagnosis not present

## 2022-08-18 DIAGNOSIS — Z86718 Personal history of other venous thrombosis and embolism: Secondary | ICD-10-CM | POA: Diagnosis not present

## 2022-08-18 DIAGNOSIS — Z1152 Encounter for screening for COVID-19: Secondary | ICD-10-CM | POA: Diagnosis not present

## 2022-08-18 DIAGNOSIS — N179 Acute kidney failure, unspecified: Secondary | ICD-10-CM | POA: Diagnosis present

## 2022-08-18 DIAGNOSIS — J90 Pleural effusion, not elsewhere classified: Secondary | ICD-10-CM | POA: Diagnosis present

## 2022-08-18 DIAGNOSIS — R64 Cachexia: Secondary | ICD-10-CM | POA: Diagnosis present

## 2022-08-18 DIAGNOSIS — E876 Hypokalemia: Secondary | ICD-10-CM | POA: Diagnosis present

## 2022-08-18 DIAGNOSIS — R918 Other nonspecific abnormal finding of lung field: Secondary | ICD-10-CM | POA: Diagnosis not present

## 2022-08-18 DIAGNOSIS — D649 Anemia, unspecified: Secondary | ICD-10-CM | POA: Diagnosis not present

## 2022-08-18 DIAGNOSIS — C61 Malignant neoplasm of prostate: Secondary | ICD-10-CM | POA: Diagnosis not present

## 2022-08-18 DIAGNOSIS — Z515 Encounter for palliative care: Secondary | ICD-10-CM | POA: Diagnosis not present

## 2022-08-18 DIAGNOSIS — J189 Pneumonia, unspecified organism: Secondary | ICD-10-CM | POA: Diagnosis present

## 2022-08-18 DIAGNOSIS — D696 Thrombocytopenia, unspecified: Secondary | ICD-10-CM | POA: Diagnosis present

## 2022-08-18 DIAGNOSIS — C7951 Secondary malignant neoplasm of bone: Secondary | ICD-10-CM | POA: Diagnosis present

## 2022-08-18 DIAGNOSIS — Z9221 Personal history of antineoplastic chemotherapy: Secondary | ICD-10-CM | POA: Diagnosis not present

## 2022-08-18 DIAGNOSIS — C801 Malignant (primary) neoplasm, unspecified: Secondary | ICD-10-CM | POA: Diagnosis not present

## 2022-08-18 DIAGNOSIS — I10 Essential (primary) hypertension: Secondary | ICD-10-CM | POA: Diagnosis present

## 2022-08-18 DIAGNOSIS — Z7189 Other specified counseling: Secondary | ICD-10-CM | POA: Diagnosis not present

## 2022-08-18 DIAGNOSIS — Z8546 Personal history of malignant neoplasm of prostate: Secondary | ICD-10-CM | POA: Diagnosis not present

## 2022-08-18 DIAGNOSIS — J984 Other disorders of lung: Secondary | ICD-10-CM | POA: Diagnosis not present

## 2022-08-18 DIAGNOSIS — Z880 Allergy status to penicillin: Secondary | ICD-10-CM | POA: Diagnosis not present

## 2022-08-18 DIAGNOSIS — E860 Dehydration: Secondary | ICD-10-CM | POA: Diagnosis present

## 2022-08-18 DIAGNOSIS — Z66 Do not resuscitate: Secondary | ICD-10-CM | POA: Diagnosis not present

## 2022-08-18 DIAGNOSIS — Z7952 Long term (current) use of systemic steroids: Secondary | ICD-10-CM | POA: Diagnosis not present

## 2022-08-18 DIAGNOSIS — J9811 Atelectasis: Secondary | ICD-10-CM | POA: Diagnosis not present

## 2022-08-18 DIAGNOSIS — K5730 Diverticulosis of large intestine without perforation or abscess without bleeding: Secondary | ICD-10-CM | POA: Diagnosis not present

## 2022-08-18 DIAGNOSIS — R4589 Other symptoms and signs involving emotional state: Secondary | ICD-10-CM | POA: Diagnosis not present

## 2022-08-18 DIAGNOSIS — Z881 Allergy status to other antibiotic agents status: Secondary | ICD-10-CM | POA: Diagnosis not present

## 2022-08-18 LAB — TYPE AND SCREEN: ABO/RH(D): A NEG

## 2022-08-18 LAB — BASIC METABOLIC PANEL
Anion gap: 11 (ref 5–15)
BUN: 41 mg/dL — ABNORMAL HIGH (ref 8–23)
CO2: 22 mmol/L (ref 22–32)
Calcium: 8.7 mg/dL — ABNORMAL LOW (ref 8.9–10.3)
Chloride: 114 mmol/L — ABNORMAL HIGH (ref 98–111)
Creatinine, Ser: 1.16 mg/dL (ref 0.61–1.24)
GFR, Estimated: 58 mL/min — ABNORMAL LOW (ref >60)
Glucose, Bld: 95 mg/dL (ref 70–99)
Potassium: 3.2 mmol/L — ABNORMAL LOW (ref 3.5–5.1)
Sodium: 147 mmol/L — ABNORMAL HIGH (ref 135–145)

## 2022-08-18 LAB — IRON AND TIBC
Iron: 128 ug/dL (ref 45–182)
Saturation Ratios: 52 % — ABNORMAL HIGH (ref 17.9–39.5)
TIBC: 248 ug/dL — ABNORMAL LOW (ref 250–450)
UIBC: 120 ug/dL

## 2022-08-18 LAB — CBC
HCT: 25.2 % — ABNORMAL LOW (ref 39.0–52.0)
Hemoglobin: 7.9 g/dL — ABNORMAL LOW (ref 13.0–17.0)
MCH: 29.3 pg (ref 26.0–34.0)
MCHC: 31.3 g/dL (ref 30.0–36.0)
MCV: 93.3 fL (ref 80.0–100.0)
Platelets: 34 10*3/uL — ABNORMAL LOW (ref 150–400)
RBC: 2.7 MIL/uL — ABNORMAL LOW (ref 4.22–5.81)
RDW: 17.4 % — ABNORMAL HIGH (ref 11.5–15.5)
WBC: 5.2 10*3/uL (ref 4.0–10.5)
nRBC: 20.2 % — ABNORMAL HIGH (ref 0.0–0.2)

## 2022-08-18 LAB — VITAMIN B12: Vitamin B-12: 762 pg/mL (ref 180–914)

## 2022-08-18 LAB — FOLATE: Folate: 5.5 ng/mL — ABNORMAL LOW (ref >5.9)

## 2022-08-18 LAB — FERRITIN: Ferritin: >7500 ng/mL — ABNORMAL HIGH (ref 24–336)

## 2022-08-18 LAB — BPAM RBC

## 2022-08-18 MED ORDER — LACTATED RINGERS IV SOLN: INTRAVENOUS | Status: DC

## 2022-08-18 MED ORDER — MEGESTROL ACETATE 20 MG PO TABS: 20.0000 mg | ORAL_TABLET | Freq: Every evening | ORAL | Status: DC | PRN

## 2022-08-18 MED ORDER — ENSURE ENLIVE PO LIQD
237.0000 mL | Freq: Two times a day (BID) | ORAL | Status: DC
  Administered 2022-08-18 – 2022-08-29 (×19): 237 mL via ORAL

## 2022-08-18 MED ORDER — FERROUS SULFATE 325 (65 FE) MG PO TABS
325.0000 mg | ORAL_TABLET | Freq: Two times a day (BID) | ORAL | Status: DC
  Administered 2022-08-18 – 2022-08-29 (×19): 325 mg via ORAL
  Filled 2022-08-18 (×22): qty 1

## 2022-08-18 MED ORDER — ABIRATERONE ACETATE 250 MG PO TABS
250.0000 mg | ORAL_TABLET | Freq: Two times a day (BID) | ORAL | Status: DC
  Administered 2022-08-18 – 2022-08-29 (×22): 250 mg via ORAL

## 2022-08-18 MED ORDER — ATENOLOL 50 MG PO TABS: 50.0000 mg | ORAL_TABLET | Freq: Every day | ORAL | Status: DC | PRN

## 2022-08-18 MED ORDER — POTASSIUM CHLORIDE CRYS ER 20 MEQ PO TBCR
40.0000 meq | EXTENDED_RELEASE_TABLET | ORAL | Status: AC
  Administered 2022-08-18 (×2): 40 meq via ORAL
  Filled 2022-08-18 (×2): qty 2

## 2022-08-18 MED ORDER — FLUTICASONE PROPIONATE 50 MCG/ACT NA SUSP: 2.0000 | Freq: Every day | NASAL | Status: DC | PRN

## 2022-08-18 NOTE — Progress Notes (Signed)
Per pt's daughter, pt only eats pureed foods at home and requested a diet change. Dr. Jacqulyn Bath made aware. New order given for pureed diet.

## 2022-08-18 NOTE — Progress Notes (Signed)
PROGRESS NOTE    Craig Freeman  UJW:[id number] DOB: 1924/04/24 DOA: 08/17/2022 PCP: Georgann Housekeeper, MD   Brief Narrative:  HPI: Craig Freeman is a 89 y.o. male with medical history significant for metastatic prostate cancer to bone on abiraterone, chronic anemia and thrombocytopenia, HTN, HLD, remote DVT off anticoagulation due to GI bleeding who presented to the ED for evaluation of fatigue and generalized weakness.   Patient presenting to the ED for evaluation of generalized weakness, fatigue, decreased appetite over the last couple days.  He has had some lightheadedness and dizziness.  Denies any obvious visible bleeding.  Normally ambulates with cane or walker but has decreased ability lately.  Also has been having occasional dry cough and clear rhinorrhea.  He is no longer on any anticoagulation due to GI bleed last month.   ED Course  Labs/Imaging on admission: I have personally reviewed following labs and imaging studies.   Initial vitals showed BP 116/60, pulse 108, RR 18, temp 97.4 F, SpO2 99% on room air.   Labs show hemoglobin 6.7 (baseline between 8-9), platelets 45,000, WBC 6.8, sodium 144, potassium 3.9, bicarb 21, BUN 51, creatinine 1.59, serum glucose 140.   FOBT weakly positive per EDP.  SARS-CoV-2, influenza, RSV PCR negative.  Anemia panel in process.   Portable chest x-ray negative for focal consolidation, edema, effusion.  Sclerotic lesion in the proximal right humeral shaft new compared to prior study with suspected few small scattered sclerotic metastases in the ribs.   Patient was given 500 cc normal saline in order to receive 1 unit PRBC transfusion.  EDP notified [facility] Dr. Levora Angel.  The hospitalist service was consulted to admit for further evaluation and management.  Assessment & Plan:   Principal Problem:   Symptomatic anemia Active Problems:   AKI (acute kidney injury) (HCC)   Hypertension   Prostate cancer metastatic to bone (HCC)    Hyperlipidemia   Thrombocytopenia (HCC)  Symptomatic acute on chronic anemia: Anemia likely multifactorial from slow GI bleed and metastatic disease.  FOBT weakly positive.  No longer on any blood thinners.  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  Seen by GI, no plans for any intervention or scopes.  Patient is s/p transfusion 1 unit PRBC.  Hemoglobin improved from 6.7-7.9.  Monitor daily.   Acute kidney injury: Resolved.   Chronic thrombocytopenia: Platelets at recent baseline.  No indication for transfusion at time of admission.   Metastatic prostate cancer to bone: Follows with oncology Dr. Al Pimple.  On active management with Zytiga, prednisone, and Xgeva.  Daughter states that patient is still taking Nubeqa although appears this may have been discontinued by her oncologist on last visit.   Hypertension: Blood pressure controlled.  Resume atenolol but hold losartan.   Hyperlipidemia: Continue atorvastatin.  Dehydration: Clinically appears to be dehydrated.  Will start him on Ringer's lactate at 125 cc/h and reassess tomorrow morning.  Generalized weakness/debility/deconditioning: Appears to be very very weak to the point that he is unable to hold any conversation.  Consulting PT OT.  Will likely require SNF discharge.  DVT prophylaxis: SCDs Start: 08/17/22 2237   Code Status: DNR  Family Communication:  None present at bedside.    Status is: Observation The patient will require care spanning > 2 midnights and should be moved to inpatient because: Patient very weak, needs observation and PT OT assessment.   Estimated body mass index is 22.31 kg/m as calculated from the  following:   Height as of this encounter: 5\' 4"  (1.626 m).   Weight as of this encounter: 59 kg.    Nutritional Assessment: Body mass index is 22.31 kg/m.Marland Kitchen Seen by dietician.  I agree with the assessment and plan as outlined below: Nutrition Status:         . Skin Assessment: I have examined the patient's skin and I agree with the wound assessment as performed by the wound care RN as outlined below:    Consultants:  GI and oncology  Procedures:  None  Antimicrobials:  Anti-infectives (From admission, onward)    None         Subjective: Patient seen and examined.  Patient sleepy and too weak to hold any conversation.    Objective: Vitals:   08/17/22 2343 08/17/22 2358 08/18/22 0300 08/18/22 0700  BP: (!) 122/55 129/64 134/69 131/65  Pulse: 81 86 80 75  Resp: 17 18 16 18   Temp: 97.7 F (36.5 C) 99 F (37.2 C) 98.7 F (37.1 C) 98.1 F (36.7 C)  TempSrc: Oral Oral Axillary Axillary  SpO2: 98% 100% 100% 100%  Weight:      Height:        Intake/Output Summary (Last 24 hours) at 08/18/2022 1022 Last data filed at 08/18/2022 0500 Gross per 24 hour  Intake 976.65 ml  Output 200 ml  Net 776.65 ml   Filed Weights   08/17/22 1844  Weight: 59 kg    Examination:  General exam: Appears lethargic and very weak.  Dehydrated. Respiratory system: Clear to auscultation. Respiratory effort normal. Cardiovascular system: S1 & S2 heard, RRR. No JVD, murmurs, rubs, gallops or clicks. No pedal edema. Gastrointestinal system: Abdomen is nondistended, soft and nontender. No organomegaly or masses felt. Normal bowel sounds heard. Central nervous system: Lethargic.  Unable to assess full neurological exam.  Data Reviewed: I have personally reviewed following labs and imaging studies  CBC: Recent Labs  Lab 08/17/22 1950 08/18/22 0613  WBC 6.8 5.2  NEUTROABS 3.6  --   HGB 6.7* 7.9*  HCT 21.9* 25.2*  MCV 93.6 93.3  PLT 45* 34*   Basic Metabolic Panel: Recent Labs  Lab 08/17/22 1950 08/18/22 0613  NA 144 147*  K 3.9 3.2*  CL 111 114*  CO2 21* 22  GLUCOSE 140* 95  BUN 51* 41*  CREATININE 1.59* 1.16  CALCIUM 9.4 8.7*   GFR: Estimated Creatinine Clearance: 31.1 mL/min (by C-G formula based on SCr of 1.16  mg/dL). Liver Function Tests: Recent Labs  Lab 08/17/22 1950  AST 76*  ALT 32  ALKPHOS 59  BILITOT 1.3*  PROT 7.3  ALBUMIN 3.8   No results for input(s): "LIPASE", "AMYLASE" in the last 168 hours. No results for input(s): "AMMONIA" in the last 168 hours. Coagulation Profile: No results for input(s): "INR", "PROTIME" in the last 168 hours. Cardiac Enzymes: No results for input(s): "CKTOTAL", "CKMB", "CKMBINDEX", "TROPONINI" in the last 168 hours. BNP (last 3 results) No results for input(s): "PROBNP" in the last 8760 hours. HbA1C: No results for input(s): "HGBA1C" in the last 72 hours. CBG: No results for input(s): "GLUCAP" in the last 168 hours. Lipid Profile: No results for input(s): "CHOL", "HDL", "LDLCALC", "TRIG", "CHOLHDL", "LDLDIRECT" in the last 72 hours. Thyroid Function Tests: No results for input(s): "TSH", "T4TOTAL", "FREET4", "T3FREE", "THYROIDAB" in the last 72 hours. Anemia Panel: Recent Labs    08/17/22 2138  VITAMINB12 762  FOLATE 5.5*  FERRITIN >7,500*  TIBC 248*  IRON 128  RETICCTPCT 2.0   Sepsis Labs: No results for input(s): "PROCALCITON", "LATICACIDVEN" in the last 168 hours.  Recent Results (from the past 240 hour(s))  Resp panel by RT-PCR (RSV, Flu A&B, Covid) Anterior Nasal Swab     Status: None   Collection Time: 08/17/22  7:14 PM   Specimen: Anterior Nasal Swab  Result Value Ref Range Status   SARS Coronavirus 2 by RT PCR NEGATIVE NEGATIVE Final    Comment: (NOTE) SARS-CoV-2 target nucleic acids are NOT DETECTED.  The SARS-CoV-2 RNA is generally detectable in upper respiratory specimens during the acute phase of infection. The lowest concentration of SARS-CoV-2 viral copies this assay can detect is 138 copies/mL. A negative result does not preclude SARS-Cov-2 infection and should not be used as the sole basis for treatment or other patient management decisions. A negative result may occur with  improper specimen collection/handling,  submission of specimen other than nasopharyngeal swab, presence of viral mutation(s) within the areas targeted by this assay, and inadequate number of viral copies(<138 copies/mL). A negative result must be combined with clinical observations, patient history, and epidemiological information. The expected result is Negative.  Fact Sheet for Patients:  BloggerCourse.com  Fact Sheet for Healthcare Providers:  SeriousBroker.it  This test is no t yet approved or cleared by the [place] FDA and  has been authorized for detection and/or diagnosis of SARS-CoV-2 by FDA under an Emergency Use Authorization (EUA). This EUA will remain  in effect (meaning this test can be used) for the duration of the COVID-19 declaration under Section 564(b)(1) of the Act, 21 U.S.C.section 360bbb-3(b)(1), unless the authorization is terminated  or revoked sooner.       Influenza A by PCR NEGATIVE NEGATIVE Final   Influenza B by PCR NEGATIVE NEGATIVE Final    Comment: (NOTE) The Xpert Xpress SARS-CoV-2/FLU/RSV plus assay is intended as an aid in the diagnosis of influenza from Nasopharyngeal swab specimens and should not be used as a sole basis for treatment. Nasal washings and aspirates are unacceptable for Xpert Xpress SARS-CoV-2/FLU/RSV testing.  Fact Sheet for Patients: BloggerCourse.com  Fact Sheet for Healthcare Providers: SeriousBroker.it  This test is not yet approved or cleared by the [place] FDA and has been authorized for detection and/or diagnosis of SARS-CoV-2 by FDA under an Emergency Use Authorization (EUA). This EUA will remain in effect (meaning this test can be used) for the duration of the COVID-19 declaration under Section 564(b)(1) of the Act, 21 U.S.C. section 360bbb-3(b)(1), unless the authorization is terminated or revoked.     Resp Syncytial Virus by PCR NEGATIVE  NEGATIVE Final    Comment: (NOTE) Fact Sheet for Patients: BloggerCourse.com  Fact Sheet for Healthcare Providers: SeriousBroker.it  This test is not yet approved or cleared by the [place] FDA and has been authorized for detection and/or diagnosis of SARS-CoV-2 by FDA under an Emergency Use Authorization (EUA). This EUA will remain in effect (meaning this test can be used) for the duration of the COVID-19 declaration under Section 564(b)(1) of the Act, 21 U.S.C. section 360bbb-3(b)(1), unless the authorization is terminated or revoked.  Performed at [facility], [address], [place], [place] [address]      Radiology Studies: DG Chest Portable 1 View  Result Date: 08/17/2022 CLINICAL DATA:  Productive cough with loss of appetite. Additional history of prostate cancer with bone metastases EXAM: PORTABLE CHEST 1 VIEW COMPARISON:  Chest CT with contrast 10/14/2020 FINDINGS: The heart size and mediastinal contours are within normal  limits. There is patchy aortic calcific plaque. Both lungs are clear of infiltrates with mild chronic interstitial changes in the bases. On the right there are calcified axillary lymph nodes, acromiohumeral abutment consistent with chronic rotator cuff arthropathy with a likely degenerative tear, and a sclerotic lesion in the proximal humeral shaft not seen previously and probably a metastasis. There are suspected small scattered sclerotic metastases in the ribs. The vertebra are not well seen. There is mild dextroscoliosis with advanced thoracic spondylosis. IMPRESSION: 1. No evidence of acute chest disease.  Chronic change in the bases. 2. Aortic atherosclerosis. 3. Sclerotic lesion in the proximal right humeral shaft not seen previously and probably a metastasis, with suspected few small scattered sclerotic metastases in the ribs. Electronically Signed   By: Almira Bar M.D.   On:  08/17/2022 20:20    Scheduled Meds:  abiraterone acetate  250 mg Oral BID   atorvastatin  40 mg Oral Daily   feeding supplement  237 mL Oral BID BM   mometasone-formoterol  2 puff Inhalation BID   potassium chloride  40 mEq Oral Q4H   predniSONE  5 mg Oral Q breakfast   Continuous Infusions:  lactated ringers 125 mL/hr at 08/18/22 1003     LOS: 0 days   Hughie Closs, MD [facility]  08/18/2022, 10:22 AM   *Please note that this is a verbal dictation therefore any spelling or grammatical errors are due to the "Dragon Medical One" system interpretation.  Please page via Amion and do not message via secure chat for urgent patient care matters. Secure chat can be used for non urgent patient care matters.  How to contact the [facility] Attending or Consulting provider 7A - 7P or covering provider during after hours 7P -7A, for this patient?  Check the care team in [facility] and look for a) attending/consulting TR[facility] provider listed and b) the [facility] team listed. Page or secure chat 7A-7P. Log into [email or web address] and use [place]'s universal password to access. If you do not have the password, please contact the hospital operator. Locate the [facility] provider you are looking for under [facility] and page to a number that you can be directly reached. If you still have difficulty reaching the provider, please page the [facility] (Director on Call) for the Hospitalists listed on amion for assistance.

## 2022-08-18 NOTE — Evaluation (Signed)
Physical Therapy Evaluation Patient Details Name: Craig Freeman MRN: [id number] DOB: Feb 19, 1924 Today's Date: 08/18/2022  History of Present Illness  Pt is a 89 yo male presenting to ED on 08/17/2022 due to generalized malaise, poor PO intake, lightheadedness and dizziness and limited ambulation. Pt was found to have low HgB of 6.7 and pt provided with I unit PRBC 7/23 and 7/24. CXR negative for pleural effusion and edema and focal consolidation however lesion to R humeral shaft is new and suspected scattered metastases to ribs.  Pt admitted to hospital ~ 1 month ago with rectal bleeding/GI bleed. Pt PMH includes but is not limited to: metastatic prostate cancer, thrombocytopenia, anemia, HTN, hx of DVT (warfarin), and HLD  Clinical Impression     Pt admitted with above diagnosis.  Pt currently with functional limitations due to the deficits listed below (see PT Problem List). Pt in bed when PT arrived. Daughter present and son in law entered room later during eval. Pt is a poor historian and daughter able to provide insight to PLOF and pt functional decline with daughter indicating she is no longer able to safely care for pt in home setting at this time and are seeking SNF for rehab or LCT placement pending pt progress. Patient will benefit from continued inpatient follow up therapy, <3 hours/day. Pt limited verbal communication, followed one step commands intermittently and would open eyes inconsistently upon request. Pt did not indicate pain or demonstrate pain behaviors, SOB or dizziness. Pt required max A for supine to sit, once seated EOB pt able to maintain F static sitting with B LE support when seated EOB PT noted poor management of oral secretions and pt had residual food particles in mouth and assisted with oral hygiene tasks,  pt able to engage with sit to stand from EOB with mod A, complete SPT with min A and cues at RW level. Pt left seated in recliner, all needs in place and family present.  PT communicated with SLP per apparent swallowing concerns with pt on puree' diet in home setting.  Pt will benefit from acute skilled PT to increase their independence and safety with mobility to allow discharge.         Assistance Recommended at Discharge Frequent or constant Supervision/Assistance  If plan is discharge home, recommend the following:  Can travel by private vehicle  A lot of help with walking and/or transfers;A lot of help with bathing/dressing/bathroom;Assistance with cooking/housework;Direct supervision/assist for medications management;Direct supervision/assist for financial management;Assist for transportation;Help with stairs or ramp for entrance        Equipment Recommendations None recommended by PT  Recommendations for Other Services       Functional Status Assessment Patient has had a recent decline in their functional status and demonstrates the ability to make significant improvements in function in a reasonable and predictable amount of time.     Precautions / Restrictions Precautions Precautions: Fall Restrictions Weight Bearing Restrictions: No      Mobility  Bed Mobility Overal bed mobility: Needs Assistance Bed Mobility: Supine to Sit     Supine to sit: HOB elevated, Max assist     General bed mobility comments: cues    Transfers Overall transfer level: Needs assistance Equipment used: Rolling walker (2 wheels) Transfers: Bed to chair/wheelchair/BSC   Stand pivot transfers: Mod assist, From elevated surface         General transfer comment: mod A for power up from EOB, pt able to engage, once in standing pt required  min guard for balance and min A to complete SPT at RW level    Ambulation/Gait               General Gait Details: NT for pt and staff safety due to pt required assist and limited attention  Stairs            Wheelchair Mobility     Tilt Bed    Modified Rankin (Stroke Patients Only)        Balance Overall balance assessment: Needs assistance Sitting-balance support: Feet supported Sitting balance-Leahy Scale: Fair     Standing balance support: Bilateral upper extremity supported, During functional activity, Reliant on assistive device for balance Standing balance-Leahy Scale: Poor                               Pertinent Vitals/Pain Pain Assessment Pain Assessment: No/denies pain (no apparent pain behaviors)    Home Living Family/patient expects to be discharged to:: Private residence Living Arrangements: Alone Available Help at Discharge: Family;Available PRN/intermittently Type of Home: House Home Access: Stairs to enter Entrance Stairs-Rails: Can reach both;Left Entrance Stairs-Number of Steps: 4   Home Layout: One level Home Equipment: Agricultural consultant (2 wheels);Cane - single point      Prior Function Prior Level of Function : Needs assist  Cognitive Assist : Mobility (cognitive);ADLs (cognitive) Mobility (Cognitive): Set up cues ADLs (Cognitive): Step by step cues Physical Assist : Mobility (physical);ADLs (physical) Mobility (physical): Transfers;Gait;Stairs ADLs (physical): Bathing;Dressing;Toileting;IADLs Mobility Comments: intermittent use of SPC or RW pending how pt was feeling. ADLs Comments: pt is a poor historian and daughter indicated that she was providing 24/7 supervision and assist pror to hospitalization and assisting pt with all ALDs and self care tasks.     Hand Dominance        Extremity/Trunk Assessment        Lower Extremity Assessment Lower Extremity Assessment: Generalized weakness    Cervical / Trunk Assessment Cervical / Trunk Assessment: Kyphotic  Communication   Communication: Expressive difficulties (pt able to follow one step commands intermitently and limited verbal communication and eyes open only with cues)  Cognition Arousal/Alertness: Lethargic Behavior During Therapy: Flat affect Overall  Cognitive Status: Impaired/Different from baseline Area of Impairment: Attention, Following commands, Awareness                       Following Commands: Follows one step commands inconsistently                General Comments      Exercises     Assessment/Plan    PT Assessment Patient needs continued PT services  PT Problem List Decreased strength;Decreased activity tolerance;Decreased balance;Decreased mobility;Decreased coordination;Decreased cognition;Decreased safety awareness       PT Treatment Interventions DME instruction;Gait training;Functional mobility training;Therapeutic activities;Therapeutic exercise;Balance training;Neuromuscular re-education;Patient/family education    PT Goals (Current goals can be found in the Care Plan section)  Acute Rehab PT Goals PT Goal Formulation: Patient unable to participate in goal setting    Frequency Min 1X/week     Co-evaluation               AM-PAC PT "6 Clicks" Mobility  Outcome Measure Help needed turning from your back to your side while in a flat bed without using bedrails?: A Lot Help needed moving from lying on your back to sitting on the side of a flat bed without using bedrails?:  A Lot Help needed moving to and from a bed to a chair (including a wheelchair)?: A Little Help needed standing up from a chair using your arms (e.g., wheelchair or bedside chair)?: A Little Help needed to walk in hospital room?: Total Help needed climbing 3-5 steps with a railing? : Total 6 Click Score: 12    End of Session Equipment Utilized During Treatment: Gait belt Activity Tolerance: Patient limited by lethargy Patient left: in chair;with call bell/phone within reach;with chair alarm set;with family/visitor present Nurse Communication: Mobility status PT Visit Diagnosis: Unsteadiness on feet (R26.81);Other abnormalities of gait and mobility (R26.89);Muscle weakness (generalized) (M62.81);Difficulty in walking,  not elsewhere classified (R26.2)    Time: 8295-6213 PT Time Calculation (min) (ACUTE ONLY): 30 min   Charges:   PT Evaluation $PT Eval Moderate Complexity: 1 Mod PT Treatments $Therapeutic Activity: 8-22 mins PT General Charges $$ ACUTE PT VISIT: 1 Visit         Johnny Bridge, PT Acute Rehab   Jacqualyn Posey 08/18/2022, 6:47 PM

## 2022-08-18 NOTE — Consult Note (Signed)
[facility] Consultation Note  Referring Provider: [facility] Primary Care Physician:  Georgann Housekeeper, MD Primary Gastroenterologist:  Marshall Cork PCP  Reason for Consultation:  anemia  HPI: Craig Freeman is a 89 y.o. male admitted progressive weakness and fatigue.  No overt bleeding per chart review; prior admissions in January 2024 and June 2024 for anemia in setting of hematochezia with anticoagulation.  Anticoagulation has been stopped.  Hgb 6.7, was 8.4 at discharge last month.  Patient unable to provide history.   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 Authorizing Provider  abiraterone acetate (ZYTIGA) 250 MG tablet Take 2 tablets (500 mg total) by mouth daily. Take on an empty stomach 1 hour before or 2 hours after a meal Patient taking differently: Take 250 mg by mouth in the morning and at bedtime. Take on an empty stomach 1 hour before or 2 hours after a meal 07/30/22  Yes Iruku, Burnice Logan, MD  atenolol (TENORMIN) 50 MG tablet Take 50 mg by mouth daily as needed (high blood pressure). 02/17/18  Yes [provider]  atorvastatin (LIPITOR) 40 MG tablet Take 40 mg by mouth in the morning.   Yes [provider]  chlorhexidine (PERIDEX) 0.12 % solution Use as directed 5 mLs in the mouth or throat 2 (two) times daily. 07/27/22  Yes [provider]  [employer] MELATONIN 5 MG TABS Take 5 mg by mouth at bedtime as needed (for sleep). 09/15/17  Yes [provider]  darolutamide (NUBEQA) 300 MG tablet Take 600 mg by mouth 2 (two) times daily with a meal.   Yes [provider]   denosumab (XGEVA) 120 MG/1.7ML SOLN injection Inject 120 mg into the skin every 30 (thirty) days.   Yes [provider]  ferrous sulfate 325 (65 FE) MG tablet Take 325 mg by mouth 2 (two) times daily with a meal.   Yes [provider]  fluticasone (FLONASE) 50 MCG/ACT nasal spray Place 2 sprays into both nostrils daily as needed for allergies. 12/20/17  Yes [provider]  levocetirizine (XYZAL) 5 MG tablet Take 5 mg by mouth every evening. 10/05/17  Yes [provider]  losartan (COZAAR) 50 MG tablet Take 50 mg by mouth daily as needed (high blood pressure).   Yes [provider]  megestrol (MEGACE) 20 MG tablet Take 20 mg by mouth at bedtime as needed (for hot flashes).   Yes [provider]  ondansetron (ZOFRAN) 4 MG tablet Take 1 tablet (4 mg total) by mouth every 6 (six) hours as needed for nausea. 02/08/22  Yes Rai, Ripudeep K, MD  polyethylene glycol (MIRALAX) 17 g packet Take 17 g by mouth daily as needed for mild constipation or moderate constipation (also available OTC.). 07/18/22  Yes Sharl Ma, Sarina Ill, MD  predniSONE (DELTASONE) 5 MG tablet Take 1 tablet (5 mg total) by mouth daily with breakfast. 07/28/22  Yes Iruku, Burnice Logan, MD  SYSTANE COMPLETE PF 0.6 % SOLN Place 1 drop into both eyes in the morning and at bedtime.   Yes [provider]  triazolam (HALCION) 0.125 MG tablet Take 0.125 mg by mouth at bedtime as needed for sleep.   Yes [provider]  Vibegron (GEMTESA) 75 MG TABS Take 1 tablet by mouth every evening.   Yes [provider]  albuterol (PROVENTIL HFA;VENTOLIN HFA) 108 (90 BASE) MCG/ACT inhaler Inhale 1-2 puffs into the lungs every 6 (six) hours as needed for wheezing or shortness of breath. Shortness of breath    [provider]    Current Facility-Administered Medications  Medication Dose Route Frequency Provider Last Rate Last Admin   abiraterone acetate (ZYTIGA) tablet 250 mg  250 mg  Oral BID Iruku, Burnice Logan, MD       acetaminophen (TYLENOL) tablet 650 mg  650 mg Oral Q6H PRN Charlsie Quest, MD       Or   acetaminophen (TYLENOL) suppository 650 mg  650 mg Rectal Q6H PRN Charlsie Quest, MD       albuterol (PROVENTIL) (2.5 MG/3ML) 0.083% nebulizer solution 3 mL  3 mL Inhalation Q6H PRN Charlsie Quest, MD       atenolol (TENORMIN) tablet 50 mg  50 mg Oral Daily PRN Hughie Closs, MD       atorvastatin (LIPITOR) tablet 40 mg  40 mg Oral Daily Darreld Mclean R, MD   40 mg at 08/18/22 0947   feeding supplement (ENSURE ENLIVE / ENSURE PLUS) liquid 237 mL  237 mL Oral BID BM Darreld Mclean R, MD   237 mL at 08/18/22 0956   ferrous sulfate tablet 325 mg  325 mg Oral BID WC Pahwani, Daleen Bo, MD       fluticasone (FLONASE) 50 MCG/ACT nasal spray 2 spray  2 spray Each Nare Daily PRN Hughie Closs, MD       lactated ringers infusion   Intravenous Continuous Hughie Closs, MD 125 mL/hr at 08/18/22 1003 New Bag at 08/18/22 1003   megestrol (MEGACE) tablet 20 mg  20 mg Oral QHS PRN Hughie Closs, MD       mometasone-formoterol (DULERA) 100-5 MCG/ACT inhaler 2 puff  2 puff Inhalation BID Charlsie Quest, MD       ondansetron (ZOFRAN) tablet 4 mg  4 mg Oral Q6H PRN Charlsie Quest, MD       Or   ondansetron (ZOFRAN) injection 4 mg  4 mg Intravenous Q6H PRN Darreld Mclean R, MD       oxyCODONE (Oxy IR/ROXICODONE) immediate release tablet 5 mg  5 mg Oral Q4H PRN Darreld Mclean R, MD       potassium chloride SA (KLOR-CON M) CR tablet 40 mEq  40 mEq Oral Q4H Hughie Closs, MD   40 mEq at 08/18/22 0947   predniSONE (DELTASONE) tablet 5 mg  5 mg Oral Q breakfast Darreld Mclean R, MD   5 mg at 08/18/22 4696   senna-docusate (Senokot-S) tablet 1 tablet  1 tablet Oral QHS PRN Charlsie Quest, MD        Allergies as of 08/17/2022 - Review Complete 08/17/2022  Allergen Reaction Noted   Amoxicillin Diarrhea 02/06/2022   Augmentin [amoxicillin-pot clavulanate] Diarrhea 02/06/2022   Zithromax  [azithromycin] Other (See Comments) 02/06/2022    History reviewed. No pertinent family history.  Social History   Socioeconomic History   Marital status: Married    Spouse name: Not on file   Number of children: Not on file   Years of education: Not on file   Highest education level: Not on file  Occupational History   Not  on file  Tobacco Use   Smoking status: Never   Smokeless tobacco: Never  Substance and Sexual Activity   Alcohol use: No   Drug use: No   Sexual activity: Not Currently  Other Topics Concern   Not on file  Social History Narrative   Not on file   Social Determinants of Health   Financial Resource Strain: Not on file  Food Insecurity: No Food Insecurity (08/17/2022)   Hunger Vital Sign    Worried About Running Out of Food in the Last Year: Never true    Ran Out of Food in the Last Year: Never true  Transportation Needs: No Transportation Needs (08/17/2022)   PRAPARE - Administrator, Civil Service (Medical): No    Lack of Transportation (Non-Medical): No  Physical Activity: Not on file  Stress: Not on file  Social Connections: Not on file  Intimate Partner Violence: Not At Risk (08/17/2022)   Humiliation, Afraid, Rape, and Kick questionnaire    Fear of Current or Ex-Partner: No    Emotionally Abused: No    Physically Abused: No    Sexually Abused: No    Review of Systems: Unable to answer questions thus unable to do review of systems.  Physical Exam: Vital signs in last 24 hours: Temp:  [97.4 F (36.3 C)-99.1 F (37.3 C)] 98.1 F (36.7 C) (07/24 0700) Pulse Rate:  [75-108] 75 (07/24 0700) Resp:  [16-28] 18 (07/24 0700) BP: (116-142)/(55-69) 131/65 (07/24 0700) SpO2:  [97 %-100 %] 100 % (07/24 0700) Weight:  [59 kg] 59 kg (07/23 1844) Last BM Date : 08/16/22 General:   Somnolent, difficult to arouse, non-communicative (baseline?) Head:  Normocephalic and atraumatic. Eyes:  Sclera clear, no icterus.   Conjunctiva pink. Ears:   Normal auditory acuity. Nose:  No deformity, discharge,  or lesions. Mouth:  No deformity or lesions.  Oropharynx pink & moist. Neck:  Supple; no masses or thyromegaly. Lungs:  No respiratory distress Abdomen:  Soft, nontender and nondistended. No masses, hepatosplenomegaly or hernias noted. Normal bowel sounds, without guarding, and without rebound.     Msk:  Symmetrical without gross deformities. Normal posture. Pulses:  Normal pulses noted. Extremities:  Without clubbing or edema. Neurologic:  Somnolent, doesn't answer questions Skin:  Intact without significant lesions or rashes. Cervical Nodes:  No significant cervical adenopathy. Psych:  Somnolent, non-communicative   Lab Results: Recent Labs    08/17/22 1950 08/18/22 0613  WBC 6.8 5.2  HGB 6.7* 7.9*  HCT 21.9* 25.2*  PLT 45* 34*   BMET Recent Labs    08/17/22 1950 08/18/22 0613  NA 144 147*  K 3.9 3.2*  CL 111 114*  CO2 21* 22  GLUCOSE 140* 95  BUN 51* 41*  CREATININE 1.59* 1.16  CALCIUM 9.4 8.7*   LFT Recent Labs    08/17/22 1950  PROT 7.3  ALBUMIN 3.8  AST 76*  ALT 32  ALKPHOS 59  BILITOT 1.3*   PT/INR No results for input(s): "LABPROT", "INR" in the last 72 hours.  Studies/Results: DG Chest Portable 1 View  Result Date: 08/17/2022 CLINICAL DATA:  Productive cough with loss of appetite. Additional history of prostate cancer with bone metastases EXAM: PORTABLE CHEST 1 VIEW COMPARISON:  Chest CT with contrast 10/14/2020 FINDINGS: The heart size and mediastinal contours are within normal limits. There is patchy aortic calcific plaque. Both lungs are clear of infiltrates with mild chronic interstitial changes in the bases. On the right there are calcified axillary lymph  nodes, acromiohumeral abutment consistent with chronic rotator cuff arthropathy with a likely degenerative tear, and a sclerotic lesion in the proximal humeral shaft not seen previously and probably a metastasis. There are suspected small  scattered sclerotic metastases in the ribs. The vertebra are not well seen. There is mild dextroscoliosis with advanced thoracic spondylosis. IMPRESSION: 1. No evidence of acute chest disease.  Chronic change in the bases. 2. Aortic atherosclerosis. 3. Sclerotic lesion in the proximal right humeral shaft not seen previously and probably a metastasis, with suspected few small scattered sclerotic metastases in the ribs. Electronically Signed   By: Almira Bar M.D.   On: 08/17/2022 20:20    Impression:   Anemia. Hemoccult positive stools. Multiple comorbidities including metastatic prostate cancer.  Plan:   Not convinced anemia is largely GI-tract mediated; in light of witnessed folate deficiency and concomitant thrombocytopenia, suspect strong contribution from bone marrow source (bone metastases from prostate cancer) and vitamin deficiency. In light of considerations above, patient's age and current functional status, as well as lack of overt bleeding, I would not pursue any GI tract evaluation. Would treat anemia supportively, consider hematology input for further evaluation as outpatient.  We have no plans as inpatient or outpatient to pursue any type of GI tract evaluation. My recommendations reviewed with hospitalist team. [facility] will sign-off; please call with questions; thank you for the consultation.   LOS: 0 days   Gianni Mihalik M  08/18/2022, 10:30 AM  Cell [phone] If no answer or after 5 PM call [phone]

## 2022-08-18 NOTE — Plan of Care (Signed)
Pt alert to name and birthday, follows commands, cooperative with Q2 turns. Pt with unit of blood given, tolerated without complications.  Pt with good oral intake, offering drinks of water on each rounding.  Pt with incontinence, placed external catheter. Pt resting quietly most of shift.

## 2022-08-18 NOTE — Progress Notes (Signed)
Zakhari was sleeping at time of visit. Chaplain will come back later today.     08/18/22 1000  Spiritual Encounters  Type of Visit Initial  Care provided to: Patient;Pt not available

## 2022-08-19 DIAGNOSIS — D649 Anemia, unspecified: Secondary | ICD-10-CM | POA: Diagnosis not present

## 2022-08-19 DIAGNOSIS — N179 Acute kidney failure, unspecified: Secondary | ICD-10-CM

## 2022-08-19 LAB — CBC WITH DIFFERENTIAL/PLATELET
Abs Immature Granulocytes: 0.37 10*3/uL — ABNORMAL HIGH (ref 0.00–0.07)
Basophils Absolute: 0 10*3/uL (ref 0.0–0.1)
Eosinophils Relative: 1 %
HCT: 26.5 % — ABNORMAL LOW (ref 39.0–52.0)
Hemoglobin: 8.4 g/dL — ABNORMAL LOW (ref 13.0–17.0)
Immature Granulocytes: 7 %
Lymphocytes Relative: 24 %
Lymphs Abs: 1.3 10*3/uL (ref 0.7–4.0)
MCH: 29.7 pg (ref 26.0–34.0)
MCHC: 31.7 g/dL (ref 30.0–36.0)
MCV: 93.6 fL (ref 80.0–100.0)
Monocytes Absolute: 0.6 10*3/uL (ref 0.1–1.0)
Monocytes Relative: 10 %
Neutro Abs: 3 10*3/uL (ref 1.7–7.7)
Neutrophils Relative %: 57 %
RBC: 2.83 MIL/uL — ABNORMAL LOW (ref 4.22–5.81)
RDW: 18.3 % — ABNORMAL HIGH (ref 11.5–15.5)
WBC: 5.3 10*3/uL (ref 4.0–10.5)
nRBC: 19.7 % — ABNORMAL HIGH (ref 0.0–0.2)

## 2022-08-19 LAB — BASIC METABOLIC PANEL
Anion gap: 13 (ref 5–15)
BUN: 28 mg/dL — ABNORMAL HIGH (ref 8–23)
CO2: 22 mmol/L (ref 22–32)
Calcium: 8.8 mg/dL — ABNORMAL LOW (ref 8.9–10.3)
Creatinine, Ser: 1 mg/dL (ref 0.61–1.24)
GFR, Estimated: >60 mL/min (ref >60)
Glucose, Bld: 88 mg/dL (ref 70–99)
Potassium: 3.5 mmol/L (ref 3.5–5.1)
Sodium: 145 mmol/L (ref 135–145)

## 2022-08-19 MED ORDER — NYSTATIN 100000 UNIT/ML MT SUSP
5.0000 mL | Freq: Four times a day (QID) | OROMUCOSAL | Status: DC
  Administered 2022-08-19 – 2022-08-29 (×30): 500000 [IU] via OROMUCOSAL
  Filled 2022-08-19 (×30): qty 5

## 2022-08-19 MED ORDER — FOLIC ACID 1 MG PO TABS
1.0000 mg | ORAL_TABLET | Freq: Every day | ORAL | Status: DC
  Administered 2022-08-19 – 2022-08-29 (×11): 1 mg via ORAL
  Filled 2022-08-19 (×12): qty 1

## 2022-08-19 NOTE — Evaluation (Signed)
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 male with medical history significant for metastatic prostate cancer to bone on abiraterone, chronic anemia and thrombocytopenia, HTN, HLD, remote DVT off anticoagulation due to GI bleeding who presented to the ED for evaluation of fatigue and generalized weakness. Also has been having occasional dry cough and clear rhinorrhea.  He is no longer on any anticoagulation due to GI bleed last month. Coughing with po PTA  very weak - unable to hold conversation.   in January 2024 and June 2024 for anemia in setting of hematochezia with anticoagulation, difficulty with secretions , chronic changes at lung base - nothing acute 7/23.  Swallow eval ordered due to pt having issues managing secretions and some coughing with intake.  Prior brain imaging showed "Mild atrophy.  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  His lethargy combined with weakness and suspicion of oral candidiasis are contributing factors.  Upon entrance to room patient greeted in bed with his eyes closed only verbally responding to SLP twice with weak voice.  Pooling of secretions in anterior oral cavity  noted without patient awareness nor ability for patient to dry swallow to clear.  SLP set up oral suction and brush patient's mouth and tongue as well as used oral suction to clear.  Patient then observed consuming Ensure, orange juice, water and pineapple pured.  He demonstrates clinical indication of odynophagia (wincing) with delayed audible multiple swallows per bolus.  His swallow appeared discoordinated when he took larger amounts with Po swallow subtle throat clearing noted.  Strong cough with expectoration of Kertesz viscous secretions observed x 1 during evaluation after approximately one third of his snack..  Patient reports that orange juice and Ensure are easier to swallow than water at this time.  SLP did not test solids due to patient's oral discomfort from recent infected bridge.   At this time [name] dysphagia negatively impacts his hydration and nutrition.  Recommend continue his current diet with strict precautions, honoring when he says that he does not want further p.o.  Today [name] stated food does not taste right and that he is very full after just a few bites and sips.  SLP is concern for impact of cancer and advanced age on his swallow ability and functional outcomes.  Note palliative referral is pending SLP Visit Diagnosis: Dysphagia, unspecified (R13.10);Dysphagia, oropharyngeal phase (R13.12)    Aspiration Risk  Moderate aspiration risk    Diet Recommendation Dysphagia 1 (Puree);Thin liquid    Liquid Administration via: Cup;Straw Medication Administration: Crushed with puree Supervision: Staff to assist with self feeding Compensations: Slow rate;Small sips/bites (Oral suction before and after p.o. to the anterior oral  cavity having patient help hand overhand) Postural Changes: Seated upright at 90 degrees;Remain upright for at least 30 minutes after po intake    Other  Recommendations Oral Care Recommendations: Oral care before and after PO    Recommendations for  follow up therapy are one component of a multi-disciplinary discharge planning process, led by the attending physician.  Recommendations may be updated based on patient status, additional functional criteria and insurance authorization.  Follow up Recommendations Skilled nursing-short term rehab (<3 hours/day)    full  Assistance Recommended at Discharge    Functional Status Assessment Patient has had a recent decline in their functional status and/or demonstrates limited ability to make significant improvements in function in a reasonable and predictable amount of time  Frequency and Duration min 1 x/week  1 week       Prognosis Prognosis for improved oropharyngeal function: Fair Barriers to Reach Goals: Time post onset;Severity of deficits;Motivation      Swallow Study   General Date of Onset: 08/19/22 HPI: Craig Freeman is a 89 y.o. male with medical history significant for metastatic prostate cancer to bone on abiraterone, chronic anemia and thrombocytopenia, HTN, HLD, remote DVT off anticoagulation due to GI bleeding who presented to the ED for evaluation of fatigue and generalized weakness. Also has been having occasional dry cough and clear rhinorrhea.  He is no longer on any anticoagulation due to GI bleed last month. Coughing with po PTA  very weak - unable to hold conversation.   in January 2024 and June 2024 for anemia in setting of hematochezia with anticoagulation, difficulty with secretions , chronic changes at lung base - nothing acute 7/23.  Swallow eval ordered due to pt having issues managing secretions and some coughing with intake.  Prior brain imaging showed "Mild atrophy.  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 oral candidiasis) Oral Care Completed by SLP: Yes Oral Cavity - Dentition: Other (Comment) (partial and dentition) Vision: Functional for self-feeding Self-Feeding Abilities: Needs assist (hand over hand assist) Patient Positioning: Upright in bed Baseline Vocal Quality: Low vocal intensity Volitional Cough: Weak Volitional Swallow: Unable to elicit    Oral/Motor/Sensory Function Overall Oral Motor/Sensory Function: Generalized oral weakness (lingual deviation to the left upon protrusion) Facial Sensation: Reduced left;Reduced right Lingual ROM: Reduced left Lingual Strength: Reduced Lingual Sensation: Reduced Velum: Within Functional Limits Mandible: Other (Comment) (DNT, recent oral infection from partial per daughter)   [place] chips: Not tested   Thin Liquid Thin Liquid: Impaired Presentation: Self Fed;Straw Oral Phase Impairments: Reduced labial seal;Reduced lingual movement/coordination Oral Phase Functional Implications: Other (comment);Oral holding;Prolonged oral transit (Anterior oral cavity pooling without patient clearing dry swallow, able to clear secretions mixed with boluses with oral suction) Pharyngeal  Phase Impairments: Suspected delayed Swallow;Multiple swallows;Other (comments) Other Comments: Discoordinated swallow suspected    Nectar Thick Nectar Thick Liquid: Impaired Presentation: Straw;Self Fed Oral Phase Impairments: Reduced lingual movement/coordination Oral phase functional implications: Oral holding;Prolonged oral transit Pharyngeal Phase Impairments: Multiple swallows   Honey Thick Honey Thick Liquid: Not tested   Puree Puree: Impaired Presentation: Spoon Oral Phase Impairments: Reduced lingual movement/coordination Oral Phase Functional Implications: Oral holding;Prolonged oral transit Pharyngeal Phase Impairments: Suspected delayed  Swallow   Solid  Solid: Not tested Other Comments: Did not test due to patient having recent issue with his bridge causing discomfort-per his daughter, [name], patient was seen by dentist and was reported to have had infection near his bridge      Chales Abrahams 08/19/2022,2:19 PM Rolena Infante, MS [facility] SLP Acute Rehab Services Office [phone]

## 2022-08-19 NOTE — Plan of Care (Signed)
Pt more alert this shift than prior, answering questions appropriately, compliant with Q2 turns. Resting throughout shift with intermittent awake times able to complete conversations with this RN.  Pt taking sips of water on each rounding, took pills whole with water sitting upright.  Slight cough when taking in water on one sip.

## 2022-08-19 NOTE — Consult Note (Signed)
   [facility] CM Inpatient Consult    [facility]) Accountable Care Organization (ACO) [facility] Liaison Note  08/19/2022  Craig Freeman 10/21/1924 [id number]  Location: [facility] RN [facility] Liaison screened the patient remotely at [facility].  Insurance: Medicare ACO Reach   Craig Freeman is a 89 y.o. male who is a Primary Care Patient of Georgann Housekeeper, MD. The patient was screened for 30 day readmission hospitalization with noted high risk score for unplanned readmission risk with 2 IP admissions in 6 months.  The patient was assessed for less than 30 day readmission. Chart review reveals patient lives alone with family support however is being recommended for a SNF level of care.    Plan: Will follow for disposition and follow up needs.   [facility] Care Management/Population Health does not replace or interfere with any arrangements made by the Inpatient Transition of Care team.   For questions contact:   Charlesetta Shanks, RN BSN CCM [facility]  [phone] business mobile phone Toll free office [phone]  *Concierge Line  [phone] Fax number: [phone] [email or web address]

## 2022-08-19 NOTE — Evaluation (Signed)
Occupational Therapy Evaluation Patient Details Name: Craig Freeman MRN: [id number] DOB: 24-Aug-1924 Today's Date: 08/19/2022   History of Present Illness Pt is a 89 yo male presenting to ED on 08/17/2022 due to generalized malaise, poor PO intake, lightheadedness and dizziness and limited ambulation. Pt was found to have low HgB of 6.7 and pt provided with I unit PRBC 7/23 and 7/24. CXR negative for pleural effusion and edema and focal consolidation however lesion to R humeral shaft is new and suspected scattered metastases to ribs.  Pt admitted to hospital ~ 1 month ago with rectal bleeding/GI bleed. Pt PMH includes but is not limited to: metastatic prostate cancer, thrombocytopenia, anemia, HTN, hx of DVT (warfarin), and HLD   Clinical Impression   Patient is currently requiring assistance with ADLs including up to total assist with Lower body ADLs, up to moderate assist with Upper body ADLs including feeding,  as well as moderate assist with bed mobility and up to moderate assist with functional transfers to toilet.  Current level of function is below patient's typical baseline.  Per family patient is completely caring for himself, driving, grocery shopping and going to the community center on a regular basis 1 month ago.  For past month daughter has been staying with patient most of the time to assist him due to gradually worsening weakness  During this evaluation, patient was limited by generalized weakness, impaired activity tolerance, and cognitive deficits which family reports are acute, all of which has the potential to impact patient's safety and independence during functional mobility, as well as performance for ADLs.  Patient lives alone, with family who have been providing near 24/7 assistance for the past month, however daughter reports that this will not be sustainable.  T   Patient demonstrates fair rehab potential, and should benefit from continued skilled occupational therapy  services while in acute care to maximize safety, independence and quality of life at home.  Continued occupational therapy services in a skilled nursing facility are recommended.  ?       Recommendations for follow up therapy are one component of a multi-disciplinary discharge planning process, led by the attending physician.  Recommendations may be updated based on patient status, additional functional criteria and insurance authorization.   Assistance Recommended at Discharge Frequent or constant Supervision/Assistance  Patient can return home with the following A little help with bathing/dressing/bathroom;Two people to help with walking and/or transfers;A little help with walking and/or transfers;Direct supervision/assist for medications management;Direct supervision/assist for financial management;Assist for transportation;Assistance with cooking/housework;Assistance with feeding;Help with stairs or ramp for entrance    Functional Status Assessment  Patient has had a recent decline in their functional status and demonstrates the ability to make significant improvements in function in a reasonable and predictable amount of time.  Equipment Recommendations   ([facility]er to [facility])    Recommendations for Other Services       Precautions / Restrictions Precautions Precautions: Fall Restrictions Weight Bearing Restrictions: No      Mobility Bed Mobility Overal bed mobility: Needs Assistance Bed Mobility: Supine to Sit     Supine to sit: HOB elevated, Mod assist     General bed mobility comments: cues.  Patient bradykinetic, step-by-step cues, majority of assistance to lower extremities with patient able to push-up from side-lying with head of bed significantly elevated with light assistance.    Transfers  Balance Overall balance assessment: Needs assistance Sitting-balance support: Feet supported Sitting balance-Leahy Scale: Fair     Standing  balance support: Bilateral upper extremity supported, During functional activity, Reliant on assistive device for balance Standing balance-Leahy Scale: Poor                             ADL either performed or assessed with clinical judgement   ADL Overall ADL's : Needs assistance/impaired Eating/Feeding: Minimal assistance;Moderate assistance;Cueing for sequencing;Cueing for safety;Bed level Eating/Feeding Details (indicate cue type and reason): Per SLP, patient has had difficulty clearing secretions.  Per nursing patient has had decreased p.o. and refusing food.  Per daughter patient did drink an Ensure with assistance to hold and bring to mouth. Grooming: Therapist, nutritional;Wash/dry hands;Set up;Minimal assistance;Cueing for safety;Cueing for sequencing;Sitting Grooming Details (indicate cue type and reason): Min assist for thoroughness when washing face at recliner level Upper Body Bathing: Minimal assistance;Sitting   Lower Body Bathing: Maximal assistance;Sitting/lateral leans;Adhering to back precautions   Upper Body Dressing : Minimal assistance;Sitting;Cueing for sequencing Upper Body Dressing Details (indicate cue type and reason): To don posterior gown while seated edge of bed Lower Body Dressing: Total assistance;Bed level Lower Body Dressing Details (indicate cue type and reason): Daughter reports that if patient does don his socks it would take significantly increased time.  Patient asked for OT to don socks for him. Toilet Transfer: Minimal assistance;Ambulation;Cueing for safety;Cueing for sequencing;Rolling walker (2 wheels) Toilet Transfer Details (indicate cue type and reason): Patient stood from edge of bed with minimal assist to rolling walker.  Patient ambulated forward 8 steps with rolling walker and minimal assist.  Patient ambulated backward 8 steps with rolling walker and moderate assist.  Patient required min assist to safely descend to recliner.  Patient stood  once from recliner with min guard assist in order to reposition. Toileting- Clothing Manipulation and Hygiene: Moderate assistance Toileting - Clothing Manipulation Details (indicate cue type and reason): Patient and family report that patient is usually continent of bowel and bladder.  Patient voiding through external catheter.  Would anticipate moderate assist for hygiene and clothing management.     Functional mobility during ADLs: Minimal assistance;Moderate assistance;Rolling walker (2 wheels)       Vision Baseline Vision/History:  (readers and driving.) Additional Comments: Difficult to assess.  Patient's eyes are watery and appear a bit irritated.  After face washing patient able to open eyes a bit more and make eye contact with OT.  Patient was recently driving about a month ago while wearing glasses.     Perception     Praxis      Pertinent Vitals/Pain Pain Assessment Pain Assessment: No/denies pain Pain Score: 0-No pain     Hand Dominance Right   Extremity/Trunk Assessment Upper Extremity Assessment Upper Extremity Assessment: Generalized weakness   Lower Extremity Assessment Lower Extremity Assessment: Generalized weakness   Cervical / Trunk Assessment Cervical / Trunk Assessment: Kyphotic   Communication Communication Communication: Expressive difficulties   Cognition Arousal/Alertness:  (More alert when edge of bed) Behavior During Therapy: Flat affect Overall Cognitive Status: Impaired/Different from baseline Area of Impairment: Attention, Following commands, Awareness, Orientation                 Orientation Level: Disoriented to, Time, Situation (Increase time to identify [facility].) Current Attention Level: Sustained   Following Commands: Follows one step commands inconsistently   Awareness: Emergent   General Comments: At baseline, pt follows commands  well, showed no meory deficits or impaired safety awanress.     General Comments        Exercises     Shoulder Instructions      Home Living Family/patient expects to be discharged to:: Private residence Living Arrangements: Alone Available Help at Discharge: Family;Available PRN/intermittently Type of Home: House Home Access: Stairs to enter [employer] of Steps: 4 Entrance Stairs-Rails: Can reach both;Left Home Layout: One level     Bathroom Shower/Tub: Chief Strategy Officer: Standard Bathroom Accessibility: Yes   Home Equipment: Agricultural consultant (2 wheels);Cane - single point          Prior Functioning/Environment Prior Level of Function : Needs assist  Cognitive Assist : Mobility (cognitive);ADLs (cognitive) Mobility (Cognitive): Set up cues ADLs (Cognitive): Set up cues Physical Assist : Mobility (physical);ADLs (physical) Mobility (physical): Transfers;Gait;Stairs ADLs (physical): Bathing;Dressing;Toileting;IADLs Mobility Comments: intermittent use of SPC or RW pending how pt was feeling. ADLs Comments: Pt was driving up until 1 month ago. Doing his grocery shopping, goiing to the commnity center, taking walkers. pt is a poor historian and daughter indicated that she was providing 24/7 supervision and assist pror to hospitalization and assisting pt with all ALDs and self care task and staying with pt. However daughter cannot continue this indefinately and needs pt to be able to return home alone  OR consider LTC.        OT Problem List: Decreased activity tolerance;Impaired balance (sitting and/or standing);Decreased strength;Decreased cognition;Decreased safety awareness;Decreased knowledge of use of DME or AE;Decreased knowledge of precautions;Cardiopulmonary status limiting activity      OT Treatment/Interventions: Self-care/ADL training;Therapeutic activities;Therapeutic exercise;Cognitive remediation/compensation;Visual/perceptual remediation/compensation;Energy conservation;Patient/family education;Balance training;DME and/or  AE instruction    OT Goals(Current goals can be found in the care plan section) Acute Rehab OT Goals Patient Stated Goal: For patient to recover sufficiently while in rehab to allow him to return to independent living alone.  If this does not occur family is open to long-term care discussions. OT Goal Formulation: With family Time For Goal Achievement: 09/02/22 Potential to Achieve Goals: Fair ADL Goals Pt Will Perform Grooming: standing;with supervision (tolerating 3/3 with vitals stable) Pt Will Perform Lower Body Bathing: with adaptive equipment;with caregiver independent in assisting;with min guard assist;sitting/lateral leans;sit to/from stand;with supervision Pt Will Perform Lower Body Dressing: with set-up;with supervision;sitting/lateral leans;with adaptive equipment;sit to/from stand Pt Will Transfer to Toilet: with supervision;ambulating Pt Will Perform Toileting - Clothing Manipulation and hygiene: with supervision;with adaptive equipment;sitting/lateral leans;sit to/from stand;with caregiver independent in assisting Additional ADL Goal #1: Pt will demonstrate improved mentation by scoring <4/10 on short blessed test and answering 4/4 safety questions from the [facility] correctly:     1. What do you do for yourself if you are sick with a cold?    2. What do you do if you burn yourself and the wound becomes infected?    3. What do you do if you experience severe chest pain and shortness of breath?   4. What number do you call in an emergency?    In order to determine best discharge plan.  OT Frequency: Min 1X/week    Co-evaluation              AM-PAC OT "6 Clicks" Daily Activity     Outcome Measure Help from another person eating meals?: A Lot Help from another person taking care of personal grooming?: A Little Help from another person toileting, which includes using toliet, bedpan, or urinal?: A Lot Help from another  person bathing (including washing, rinsing, drying)?: A Lot Help  from another person to put on and taking off regular upper body clothing?: A Little Help from another person to put on and taking off regular lower body clothing?: Total 6 Click Score: 13   End of Session Equipment Utilized During Treatment: Gait belt;Rolling walker (2 wheels) Nurse Communication: Mobility status  Activity Tolerance: Patient limited by fatigue Patient left: in chair;with call bell/phone within reach;with chair alarm set;with family/visitor present  OT Visit Diagnosis: Unsteadiness on feet (R26.81);Other symptoms and signs involving cognitive function;Muscle weakness (generalized) (M62.81)                Time: 6644-0347 OT Time Calculation (min): 44 min Charges:  OT General Charges $OT Visit: 1 Visit OT Evaluation $OT Eval Low Complexity: 1 Low OT Treatments $Self Care/Home Management : 8-22 mins $Therapeutic Activity: 8-22 mins  Victorino Dike, OT Acute Rehab Services Office: [phone] 08/19/2022  Theodoro Clock 08/19/2022, 3:07 PM

## 2022-08-19 NOTE — NC FL2 (Signed)
[place] MEDICAID FL2 LEVEL OF CARE FORM     IDENTIFICATION  Patient Name: Craig Freeman Birthdate: Mar 27, 1924 Sex: male Admission Date (Current Location): 08/17/2022  [facility] and [place] Number:  Producer, television/film/video and Address:  [facility],  [address] [place], [place] [address]      Provider Number: [id number]  Attending Physician Name and Address:  Jerald Kief, MD  Relative Name and Phone Number:  daugther Noelle Penner [phone]    Current Level of Care: Hospital Recommended Level of Care: Skilled Nursing Facility Prior Approval Number: [id number]  Date Approved/Denied:   PASRR Number:    Discharge Plan: SNF    Current Diagnoses: Patient Active Problem List   Diagnosis Date Noted   Anemia 08/18/2022   Symptomatic anemia 08/17/2022   AKI (acute kidney injury) (HCC) 08/17/2022   History of DVT (deep vein thrombosis) 07/16/2022   Transaminitis 07/16/2022   Anemia due to chemotherapy 07/16/2022   Thrombocytopenia (HCC) 07/16/2022   GI bleed 02/07/2022   Rectal bleeding 02/06/2022   Hyperlipidemia 02/06/2022   Prostate cancer metastatic to bone (HCC) 12/30/2021   Ceruminosis, bilateral 09/18/2021   Abscess 06/14/2011   Fever 06/14/2011   Hypertension 06/14/2011   Hypokalemia 06/14/2011   Insomnia 06/14/2011    Orientation RESPIRATION BLADDER Height & Weight     Self, Time, Place  Normal Incontinent Weight: 59 kg Height:  5\' 4"  (162.6 cm)  BEHAVIORAL SYMPTOMS/MOOD NEUROLOGICAL BOWEL NUTRITION STATUS      Incontinent Diet  AMBULATORY STATUS COMMUNICATION OF NEEDS Skin     Verbally Normal                       Personal Care Assistance Level of Assistance  Bathing, Feeding, Dressing, Total care Bathing Assistance: Maximum assistance Feeding assistance: Maximum assistance Dressing Assistance: Maximum assistance Total Care Assistance: Maximum assistance   Functional Limitations Info  Sight, Hearing, Speech Sight Info:  Adequate Hearing Info: Adequate Speech Info: Adequate    SPECIAL CARE FACTORS FREQUENCY  PT (By licensed PT), OT (By licensed OT)     PT Frequency: PT/OT 5 X  per week              Contractures Contractures Info: Not present    Additional Factors Info  Allergies   Allergies Info: Amoxicillin, Argumentin, Zithromax           Current Medications (08/19/2022):  This is the current hospital active medication list Current Facility-Administered Medications  Medication Dose Route Frequency Provider Last Rate Last Admin   abiraterone acetate (ZYTIGA) tablet 250 mg  250 mg Oral BID Rachel Moulds, MD   250 mg at 08/19/22 1031   acetaminophen (TYLENOL) tablet 650 mg  650 mg Oral Q6H PRN Charlsie Quest, MD       Or   acetaminophen (TYLENOL) suppository 650 mg  650 mg Rectal Q6H PRN Darreld Mclean R, MD       albuterol (PROVENTIL) (2.5 MG/3ML) 0.083% nebulizer solution 3 mL  3 mL Inhalation Q6H PRN Charlsie Quest, MD       atenolol (TENORMIN) tablet 50 mg  50 mg Oral Daily PRN Pahwani, Daleen Bo, MD       atorvastatin (LIPITOR) tablet 40 mg  40 mg Oral Daily Darreld Mclean R, MD   40 mg at 08/19/22 0949   feeding supplement (ENSURE ENLIVE / ENSURE PLUS) liquid 237 mL  237 mL Oral BID BM Charlsie Quest, MD   237 mL  at 08/19/22 0949   ferrous sulfate tablet 325 mg  325 mg Oral BID WC Hughie Closs, MD   325 mg at 08/19/22 0812   fluticasone (FLONASE) 50 MCG/ACT nasal spray 2 spray  2 spray Each Nare Daily PRN Hughie Closs, MD       folic acid (FOLVITE) tablet 1 mg  1 mg Oral Daily Jerald Kief, MD   1 mg at 08/19/22 6045   lactated ringers infusion   Intravenous Continuous Hughie Closs, MD 125 mL/hr at 08/19/22 0225 New Bag at 08/19/22 0225   megestrol (MEGACE) tablet 20 mg  20 mg Oral QHS PRN Hughie Closs, MD       mometasone-formoterol (DULERA) 100-5 MCG/ACT inhaler 2 puff  2 puff Inhalation BID Charlsie Quest, MD   2 puff at 08/19/22 0728   ondansetron (ZOFRAN) tablet 4 mg  4 mg  Oral Q6H PRN Charlsie Quest, MD       Or   ondansetron (ZOFRAN) injection 4 mg  4 mg Intravenous Q6H PRN Charlsie Quest, MD       oxyCODONE (Oxy IR/ROXICODONE) immediate release tablet 5 mg  5 mg Oral Q4H PRN Charlsie Quest, MD       predniSONE (DELTASONE) tablet 5 mg  5 mg Oral Q breakfast Darreld Mclean R, MD   5 mg at 08/19/22 4098   senna-docusate (Senokot-S) tablet 1 tablet  1 tablet Oral QHS PRN Charlsie Quest, MD         Discharge Medications: Please see discharge summary for a list of discharge medications.  Relevant Imaging Results:  Relevant Lab Results:   Additional Information SSN [id number]  Harriett Sine, RN

## 2022-08-19 NOTE — Progress Notes (Signed)
Speech Language Pathology Treatment: Dysphagia  Patient Details Name: Craig Freeman MRN: [id number] DOB: Nov 11, 1924 Today's Date: 08/19/2022 Time: 7829-5621 SLP Time Calculation (min) (ACUTE ONLY): 11 min  Assessment / Plan / Recommendation Clinical Impression  Separate session focused on education with daughter and patient to findings and specific recommendations. Discussed compensation strategies including starting intake with liquids, providing intake that has more thermal and gustatory stimulation to help improve oral transit deficiency, observing laryngeal elevation for assurance that patient swallows and use of oral suction demonstration.   Advised patient's daughter that if he indicates he does not want to eat after a few bites and sips, it is optimal to honor his wishes.  Also advised to maximize liquid nutrition as that seems most efficient at this time.    Daughter reports patient in the last 2 weeks has been communicating less, sleeping more, drooling, retaining secretions in anterior oral cavity and not consuming much.   SLP is thankful patient has a palliative consult given his advanced age of 89, his recent decline and his metastatic cancer.   Encouraged daughter to offer patient intake when he is accepting, sits fully upright and can tolerate. Cease p.o. intake if he is coughing, oral holding without swallow or declines po.  Given pt has been progressively declining in the last 2 weeks, concern for patient's ability to recover swallowing ability to functional level is guarded. , However hopefully treatment of potential oral candidiasis may be helpful.   Using teach back and posting swallow signs patient and daughter educated.     HPI HPI: Craig Freeman is a 89 y.o. male with medical history significant for metastatic prostate cancer to bone on abiraterone, chronic anemia and thrombocytopenia, HTN, HLD, remote DVT off anticoagulation due to GI bleeding who presented to the ED  for evaluation of fatigue and generalized weakness. Also has been having occasional dry cough and clear rhinorrhea.  He is no longer on any anticoagulation due to GI bleed last month. Coughing with po PTA  very weak - unable to hold conversation.   in January 2024 and June 2024 for anemia in setting of hematochezia with anticoagulation, difficulty with secretions , chronic changes at lung base - nothing acute 7/23.  Swallow eval ordered due to pt having issues managing secretions and some coughing with intake.  Prior brain imaging showed "Mild atrophy.  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  Recommendations may be updated based on patient status, additional functional criteria and insurance authorization.    Recommendations  Diet recommendations: Dysphagia 1 (puree);Thin liquid Liquids provided via: Straw;Cup Medication Administration: Crushed with puree Supervision:  (Have patient self feed with hand overhand assist please) Compensations: Slow rate;Small sips/bites (Oral suction before and after p.o. to the anterior oral cavity having patient help hand overhand) Postural Changes and/or Swallow Maneuvers: Seated upright 90 degrees;Upright 30-60 min after meal                  Oral care before and after PO     Dysphagia, unspecified (R13.10);Dysphagia, oropharyngeal phase (R13.12)         Rolena Infante, MS [facility] SLP Acute Rehab Services Office [phone]   Chales Abrahams  08/19/2022, 2:28 PM

## 2022-08-19 NOTE — Progress Notes (Addendum)
  Progress Note   Patient: Craig Freeman:[id number] DOB: 03-24-1924 DOA: 08/17/2022     1 DOS: the patient was seen and examined on 08/19/2022   Brief hospital course: Craig Freeman is a 89 y.o. 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  Assessment and Plan: Symptomatic acute on chronic anemia: Anemia likely multifactorial from slow GI bleed and metastatic disease.  FOBT weakly positive.  No longer on any blood thinners.  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  Seen by GI, no plans for any intervention or scopes.  Patient is s/p transfusion 1 unit PRBC.  Hgb remains stable -Folic acid low at 5.5. Prescribed folate supplements -Cont to follow hgb trends and transfuse as needed   Acute kidney injury: Resolved. Cont to follow bmet trends   Chronic thrombocytopenia: Platelets at recent baseline.  No indication for transfusion at time of admission.   Metastatic prostate cancer to bone: Follows with oncology Dr. Al Pimple.  On active management with Zytiga, prednisone, and Xgeva.  Daughter states that patient is still taking Nubeqa although appears this may have been discontinued by her oncologist on last visit.   Hypertension: Blood pressure controlled.  Resumed atenolol  -Losartan remains on hold   Hyperlipidemia: Continue atorvastatin.   Dehydration:  -Given course of LR   Generalized weakness/debility/deconditioning: Appears to be very very weak to the point that he is unable to hold any conversation.  Consulting PT OT.  Plan for SNF on d/c      Subjective: Without complaints  Physical Exam: Vitals:   08/18/22 2059 08/19/22 0508 08/19/22 0729 08/19/22 1450  BP: (!) 155/69 (!) 150/63  124/68  Pulse: 82 78  97  Resp:  14 14  18   Temp: 98.2 F (36.8 C) 98.7 F (37.1 C)  98.5 F (36.9 C)  TempSrc: Axillary Axillary  Oral  SpO2: 100% 100% 97% 98%  Weight:      Height:       General exam: Awake, laying in bed, in nad Respiratory system: Normal respiratory effort, no wheezing Cardiovascular system: regular rate, s1, s2 Gastrointestinal system: Soft, nondistended, positive BS Central nervous system: CN2-12 grossly intact, strength intact Extremities: Perfused, no clubbing Skin: Normal skin turgor, no notable skin lesions seen Psychiatry: Mood normal // no visual hallucinations   Data Reviewed:  Labs reviewed: Na 145, K 3.5, Cr 1.00, WBC 5.3, Hgb 8.4   Family Communication: Pt in room, family at bedside  Disposition: Status is: Inpatient Remains inpatient appropriate because: Severity of illness  Planned Discharge Destination: SNF     Author: Rickey Barbara, MD 08/19/2022 5:23 PM  For on call review [email or web address].

## 2022-08-20 DIAGNOSIS — D649 Anemia, unspecified: Secondary | ICD-10-CM | POA: Diagnosis not present

## 2022-08-20 DIAGNOSIS — N179 Acute kidney failure, unspecified: Secondary | ICD-10-CM | POA: Diagnosis not present

## 2022-08-20 MED ORDER — ATENOLOL 50 MG PO TABS
50.0000 mg | ORAL_TABLET | Freq: Every day | ORAL | Status: DC
  Administered 2022-08-20 – 2022-08-29 (×10): 50 mg via ORAL
  Filled 2022-08-20 (×11): qty 1

## 2022-08-20 NOTE — Plan of Care (Signed)

## 2022-08-20 NOTE — Progress Notes (Signed)
Date and time results received: 08/20/22 5:59 AM    Test: Plt Critical Value: 22  Name of Provider Notified: Anthoney Harada, NP  Orders Received? Or Actions Taken?: Message received, informed no bleeding noted.

## 2022-08-20 NOTE — TOC Progression Note (Addendum)
Transition of Care [facility]) - Progression Note    Patient Details  Name: Craig Freeman MRN: [id number] Date of Birth: Feb 25, 1924  Transition of Care [facility]) CM/SW Contact  Harriett Sine, RN Phone Number: [phone]  08/20/2022, 1:53 PM  Clinical Narrative:    Spoke with pt at bedside about the need to work with PT as it relates to SNF placement. FL2 and PASRR obtained. TOC will follow.  Spoke with daughter [name] and pt at bedside about SNF placement, gave list. Daughter states PT will return today for service.      Expected Discharge Plan and Services                                               Social Determinants of Health (SDOH) Interventions SDOH Screenings   Food Insecurity: No Food Insecurity (08/17/2022)  Housing: Low Risk  (08/17/2022)  Transportation Needs: No Transportation Needs (08/17/2022)  Utilities: Not At Risk (08/17/2022)  Depression (PHQ2-9): Low Risk  (02/12/2022)  Tobacco Use: Low Risk  (08/17/2022)    Readmission Risk Interventions     No data to display

## 2022-08-20 NOTE — Progress Notes (Signed)
PT Note  Patient Details Name: Craig Freeman MRN: [id number] DOB: [birth year]   Cancelled Treatment:    Reason Eval/Treat Not Completed: Fatigue/lethargy limiting ability to participate. PT nor daughter unable to rouse pt midmorning.  Will return later in the day to complete intervention if time allows.   Johnny Bridge, PT Acute Rehab   Jacqualyn Posey 08/20/2022, 11:54 AM

## 2022-08-20 NOTE — Progress Notes (Signed)
Physical Therapy Treatment Patient Details Name: Craig Freeman MRN: [id number] DOB: 1924/04/22 Today's Date: 08/20/2022   History of Present Illness Pt is a 89 yo male presenting to ED on 08/17/2022 due to generalized malaise, poor PO intake, lightheadedness and dizziness and limited ambulation. Pt was found to have low HgB of 6.7 and pt provided with I unit PRBC 7/23 and 7/24. CXR negative for pleural effusion and edema and focal consolidation however lesion to R humeral shaft is new and suspected scattered metastases to ribs.  Pt admitted to hospital ~ 1 month ago with rectal bleeding/GI bleed. Pt PMH includes but is not limited to: metastatic prostate cancer, thrombocytopenia, anemia, HTN, hx of DVT (warfarin), and HLD    PT Comments   Pt admitted with above diagnosis.  Pt currently with functional limitations due to the deficits listed below (see PT Problem List). PT arrived in am and unable to rouse pt, PT returned in PM and daughter assisting pt with meal and PT returned late PM. Pt in bed and awake and able to participate with PT pt able to engage and increased participation from time of eval. Pt required mod A for supine to sit, min A for sit to stand  from EOB, commode and from recliner at RW level with multimodal cues and A for RW placement, min A for gait tasks 15 feet x 2 with RW and slow cadence. Pt left seated in recliner and all needs in place. Daughter left during tx session. No indication of pain, SOB or dizziness. Discharge plan remains appropriate with continued inpatient follow up therapy, <3 hours/day.  Pt will benefit from acute skilled PT to increase their independence and safety with mobility to allow discharge.       Assistance Recommended at Discharge Frequent or constant Supervision/Assistance  If plan is discharge home, recommend the following:  Can travel by private vehicle    A lot of help with walking and/or transfers;A lot of help with  bathing/dressing/bathroom;Assistance with cooking/housework;Direct supervision/assist for medications management;Direct supervision/assist for financial management;Assist for transportation;Help with stairs or ramp for entrance      Equipment Recommendations  None recommended by PT    Recommendations for Other Services       Precautions / Restrictions Precautions Precautions: Fall Restrictions Weight Bearing Restrictions: No     Mobility  Bed Mobility Overal bed mobility: Needs Assistance Bed Mobility: Supine to Sit     Supine to sit: HOB elevated, Mod assist     General bed mobility comments: multimodal cues with increased time for motor processing and planing, improved inititation, communication and abiltiy to maintain eyes open vs Eval    Transfers Overall transfer level: Needs assistance Equipment used: Rolling walker (2 wheels) Transfers: Sit to/from Stand Sit to Stand: Min assist           General transfer comment: multimodal cues for proper UE and AD placement with bed, recliner and commode transfers    Ambulation/Gait Ambulation/Gait assistance: Min assist Gait Distance (Feet): 15 Feet Assistive device: Rolling walker (2 wheels) Gait Pattern/deviations: Step-to pattern, Trunk flexed, Narrow base of support Gait velocity: decreased     General Gait Details: cues and assist for RW manuvering especailly with turns to approach sitting surfaces   Stairs             Wheelchair Mobility     Tilt Bed    Modified Rankin (Stroke Patients Only)       Balance Overall balance assessment: Needs assistance Sitting-balance  support: Feet supported Sitting balance-Leahy Scale: Fair     Standing balance support: Bilateral upper extremity supported, During functional activity, Reliant on assistive device for balance Standing balance-Leahy Scale: Poor                              Cognition Arousal/Alertness: Awake/alert (More alert when  edge of bed) Behavior During Therapy: Flat affect Overall Cognitive Status: Impaired/Different from baseline Area of Impairment: Attention, Following commands, Awareness, Orientation                 Orientation Level: Disoriented to, Time, Situation Current Attention Level: Sustained   Following Commands: Follows one step commands consistently   Awareness: Emergent            Exercises      General Comments        Pertinent Vitals/Pain      Home Living Family/patient expects to be discharged to:: Private residence Living Arrangements: Alone Available Help at Discharge: Family;Available PRN/intermittently Type of Home: House Home Access: Stairs to enter Entrance Stairs-Rails: Can reach both;Left Entrance Stairs-Number of Steps: 4   Home Layout: One level Home Equipment: Agricultural consultant (2 wheels);Cane - single point      Prior Function            PT Goals (current goals can now be found in the care plan section) Acute Rehab PT Goals PT Goal Formulation: Patient unable to participate in goal setting    Frequency    Min 1X/week      PT Plan      Co-evaluation              AM-PAC PT "6 Clicks" Mobility   Outcome Measure  Help needed turning from your back to your side while in a flat bed without using bedrails?: A Lot Help needed moving from lying on your back to sitting on the side of a flat bed without using bedrails?: A Lot Help needed moving to and from a bed to a chair (including a wheelchair)?: A Little Help needed standing up from a chair using your arms (e.g., wheelchair or bedside chair)?: A Little Help needed to walk in hospital room?: A Little Help needed climbing 3-5 steps with a railing? : Total 6 Click Score: 14    End of Session Equipment Utilized During Treatment: Gait belt Activity Tolerance: Patient limited by fatigue Patient left: in chair;with call bell/phone within reach;with chair alarm set Nurse Communication:  Mobility status PT Visit Diagnosis: Unsteadiness on feet (R26.81);Other abnormalities of gait and mobility (R26.89);Muscle weakness (generalized) (M62.81);Difficulty in walking, not elsewhere classified (R26.2)     Time: 3235-5732 PT Time Calculation (min) (ACUTE ONLY): 29 min  Charges:    $Gait Training: 8-22 mins $Therapeutic Activity: 8-22 mins PT General Charges $$ ACUTE PT VISIT: 1 Visit                     Johnny Bridge, PT Acute Rehab    Jacqualyn Posey 08/20/2022, 5:25 PM

## 2022-08-20 NOTE — Progress Notes (Signed)
  Progress Note   Patient: Craig Freeman FTD:[id number] DOB: [birth year] DOA: 08/17/2022     2 DOS: the patient was seen and examined on 08/20/2022   Brief hospital course: Craig Freeman is a 89 y.o. 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  Assessment and Plan: Symptomatic acute on chronic anemia: Anemia likely multifactorial from slow GI bleed and metastatic disease.  FOBT weakly positive.  No longer on any blood thinners.  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  Seen by GI, no plans for any intervention or scopes.  Patient is s/p transfusion 1 unit PRBC.  Hgb remains stable -Folic acid low at 5.5. Continue folic acid supplementation -Cont to follow hgb trends and transfuse as needed   Acute kidney injury: Resolved. Cont to follow bmet trends   Chronic thrombocytopenia: No indication for transfusion at time of admission. -Plts trending down to 22k   Metastatic prostate cancer to bone: Follows with oncology Dr. Al Pimple.  On active management with Zytiga, prednisone, and Xgeva.  Daughter states that patient is still taking Nubeqa although appears this may have been discontinued by her oncologist on last visit.   Hypertension: Blood pressure controlled.  Resumed atenolol  -Losartan remains on hold   Hyperlipidemia: Continue atorvastatin.   Dehydration:  -Given course of LR   Generalized weakness/debility/deconditioning: Appears to be very very weak to the point that he is unable to hold any conversation.  Consulting PT OT.  Pending SNF  Oral thrush -thrush plaques noted in mouth -continued on nystatin swish/swallow      Subjective: No complaints this AM  Physical Exam: Vitals:   08/19/22 0729 08/19/22 1450 08/20/22  0519 08/20/22 1348  BP:  124/68 (!) 158/77 139/69  Pulse:  97 75 70  Resp:  18 17 16   Temp:  98.5 F (36.9 C) 98.5 F (36.9 C) (!) 97.5 F (36.4 C)  TempSrc:  Oral Oral Oral  SpO2: 97% 98% 100% 100%  Weight:      Height:       General exam: Conversant, in no acute distress, whitish plaques on tongue Respiratory system: normal chest rise, clear, no audible wheezing Cardiovascular system: regular rhythm, s1-s2 Gastrointestinal system: Nondistended, nontender, pos BS Central nervous system: No seizures, no tremors Extremities: No cyanosis, no joint deformities Skin: No rashes, no pallor Psychiatry: Affect normal // no auditory hallucinations   Data Reviewed:  Labs reviewed: WBC 5.3, Hgb 8.2, Plts 22k   Family Communication: Pt in room, family at bedside  Disposition: Status is: Inpatient Remains inpatient appropriate because: Severity of illness  Planned Discharge Destination: SNF     Author: Rickey Barbara, MD 08/20/2022 3:26 PM  For on call review [email or web address].

## 2022-08-21 ENCOUNTER — Inpatient Hospital Stay (HOSPITAL_COMMUNITY): Payer: Medicare Other

## 2022-08-21 DIAGNOSIS — N179 Acute kidney failure, unspecified: Secondary | ICD-10-CM | POA: Diagnosis not present

## 2022-08-21 DIAGNOSIS — D649 Anemia, unspecified: Secondary | ICD-10-CM | POA: Diagnosis not present

## 2022-08-21 LAB — GLUCOSE, CAPILLARY: Glucose-Capillary: 104 mg/dL — ABNORMAL HIGH (ref 70–99)

## 2022-08-21 LAB — PREPARE RBC (CROSSMATCH)

## 2022-08-21 MED ORDER — POTASSIUM CHLORIDE 10 MEQ/100ML IV SOLN
10.0000 meq | INTRAVENOUS | Status: AC
  Administered 2022-08-21 – 2022-08-22 (×6): 10 meq via INTRAVENOUS
  Filled 2022-08-21 (×6): qty 100

## 2022-08-21 MED ORDER — SODIUM CHLORIDE 0.9 % IV SOLN
1.0000 g | INTRAVENOUS | Status: AC
  Administered 2022-08-21 – 2022-08-25 (×5): 1 g via INTRAVENOUS
  Filled 2022-08-21 (×5): qty 10

## 2022-08-21 MED ORDER — SODIUM CHLORIDE 0.9 % IV SOLN
100.0000 mg | Freq: Two times a day (BID) | INTRAVENOUS | Status: AC
  Administered 2022-08-21 – 2022-08-25 (×9): 100 mg via INTRAVENOUS
  Filled 2022-08-21 (×10): qty 100

## 2022-08-21 MED ORDER — POTASSIUM CHLORIDE 20 MEQ PO PACK: 60.0000 meq | PACK | ORAL | Status: DC

## 2022-08-21 MED ORDER — SODIUM CHLORIDE 0.9% IV SOLUTION: Freq: Once | INTRAVENOUS | Status: AC

## 2022-08-21 NOTE — Progress Notes (Signed)
  Progress Note   Patient: Craig Freeman ZOX:[id number] DOB: 1924-06-28 DOA: 08/17/2022     3 DOS: the patient was seen and examined on 08/21/2022   Brief hospital course: HELIOS GELTZ is a 89 y.o. 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  Assessment and Plan: Symptomatic acute on chronic anemia: Anemia likely multifactorial from slow GI bleed and metastatic disease.  FOBT weakly positive.  No longer on any blood thinners.  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  Seen by GI, no plans for any intervention or scopes.  Patient is s/p transfusion 1 unit PRBC.   -Folic acid low at 5.5. Continue folic acid supplementation -Hgb now down to 7.5 -Discussed with Oncology, recommendation to transfuse to keep Hgb >8 -Have ordered 1 unit PRBC   Acute kidney injury: Resolved. Cont to follow bmet trends   Chronic thrombocytopenia: No indication for transfusion at time of admission. -Plts trending down to 19k -Discussed with Oncology. Recommendation to transfuse 1 unit platelets   Metastatic prostate cancer to bone: Follows with oncology Dr. Al Pimple.  Had been on active management with Zytiga, prednisone, and Xgeva.  Daughter recently reported that patient is still taking Nubeqa although appears this may have been discontinued by her oncologist on last visit. -Pt currently on Zytiga. Outpt documentation reviewed. Per 07/28/22 note by Dr. Al Pimple, pt was to continue on Zytiga and if he does not respond to [facility], then he should then proceed with comfort care and hospice -At present, pt noted to have dysphagia. Reportedly unable to tolerate PO Zytiga as a result -Will consult Palliative Care   Hypertension: Blood pressure controlled.  Resumed  atenolol  -Losartan remains on hold   Hyperlipidemia: Continue atorvastatin.   Dehydration:  -Given course of LR   Generalized weakness/debility/deconditioning: Appears to be very very weak to the point that he is unable to hold any conversation.  Consulting PT OT.  Pending SNF  Oral thrush -thrush plaques noted in mouth -continued on nystatin swish/swallow      Subjective: Without complaints this AM  Physical Exam: Vitals:   08/20/22 2037 08/21/22 0500 08/21/22 0859 08/21/22 1344  BP: 129/62 119/64  139/62  Pulse: 70 67  67  Resp: 18 16  18   Temp: 99.4 F (37.4 C) 98.7 F (37.1 C)  97.8 F (36.6 C)  TempSrc: Oral Oral  Axillary  SpO2: 100% 96% 96% 99%  Weight:      Height:       General exam: Awake, laying in bed, in nad Respiratory system: Normal respiratory effort, no wheezing Cardiovascular system: regular rate, s1, s2 Gastrointestinal system: Soft, nondistended, positive BS Central nervous system: CN2-12 grossly intact, strength intact Extremities: Perfused, no clubbing Skin: Normal skin turgor, no notable skin lesions seen Psychiatry: Mood normal // no visual hallucinations   Data Reviewed:  Labs reviewed: Na 141, K 2.9, Cr 1.08, WBC 5.5, Hgb 7.5, plts 19   Family Communication: Pt in room, family not at bedside  Disposition: Status is: Inpatient Remains inpatient appropriate because: Severity of illness  Planned Discharge Destination: SNF     Author: Rickey Barbara, MD 08/21/2022 3:47 PM  For on call review [email or web address].

## 2022-08-21 NOTE — Plan of Care (Signed)

## 2022-08-22 DIAGNOSIS — D649 Anemia, unspecified: Secondary | ICD-10-CM | POA: Diagnosis not present

## 2022-08-22 DIAGNOSIS — N179 Acute kidney failure, unspecified: Secondary | ICD-10-CM | POA: Diagnosis not present

## 2022-08-22 LAB — COMPREHENSIVE METABOLIC PANEL WITH GFR
ALT: 35 U/L (ref 0–44)
AST: 61 U/L — ABNORMAL HIGH (ref 15–41)
Albumin: 2.6 g/dL — ABNORMAL LOW (ref 3.5–5.0)
Alkaline Phosphatase: 50 U/L (ref 38–126)
Anion gap: 10 (ref 5–15)
BUN: 31 mg/dL — ABNORMAL HIGH (ref 8–23)
CO2: 23 mmol/L (ref 22–32)
Calcium: 7.9 mg/dL — ABNORMAL LOW (ref 8.9–10.3)
Chloride: 107 mmol/L (ref 98–111)
Creatinine, Ser: 0.97 mg/dL (ref 0.61–1.24)
GFR, Estimated: >60 mL/min (ref >60)
Glucose, Bld: 83 mg/dL (ref 70–99)
Potassium: 3.5 mmol/L (ref 3.5–5.1)
Sodium: 140 mmol/L (ref 135–145)
Total Bilirubin: 1.3 mg/dL — ABNORMAL HIGH (ref 0.3–1.2)
Total Protein: 5.4 g/dL — ABNORMAL LOW (ref 6.5–8.1)

## 2022-08-22 LAB — URINALYSIS, W/ REFLEX TO CULTURE (INFECTION SUSPECTED)
Bilirubin Urine: NEGATIVE
Glucose, UA: NEGATIVE mg/dL
Ketones, ur: NEGATIVE mg/dL
Leukocytes,Ua: NEGATIVE
Nitrite: NEGATIVE
Protein, ur: NEGATIVE mg/dL
Specific Gravity, Urine: 1.017 (ref 1.005–1.030)
pH: 5 (ref 5.0–8.0)

## 2022-08-22 LAB — CBC
HCT: 26.2 % — ABNORMAL LOW (ref 39.0–52.0)
Hemoglobin: 8.6 g/dL — ABNORMAL LOW (ref 13.0–17.0)
MCH: 30.2 pg (ref 26.0–34.0)
MCHC: 32.8 g/dL (ref 30.0–36.0)
MCV: 91.9 fL (ref 80.0–100.0)
Platelets: 54 10*3/uL — ABNORMAL LOW (ref 150–400)
RBC: 2.85 MIL/uL — ABNORMAL LOW (ref 4.22–5.81)
RDW: 17.6 % — ABNORMAL HIGH (ref 11.5–15.5)
WBC: 4.8 10*3/uL (ref 4.0–10.5)
nRBC: 16.5 % — ABNORMAL HIGH (ref 0.0–0.2)

## 2022-08-22 MED ORDER — ORAL CARE MOUTH RINSE
15.0000 mL | OROMUCOSAL | Status: DC | PRN
  Administered 2022-08-26: 15 mL via OROMUCOSAL

## 2022-08-22 NOTE — Plan of Care (Signed)
  Problem: Safety: Goal: Ability to remain free from injury will improve Outcome: Progressing   Problem: Skin Integrity: Goal: Risk for impaired skin integrity will decrease Outcome: Progressing   Problem: Coping: Goal: Level of anxiety will decrease Outcome: Progressing   

## 2022-08-22 NOTE — Progress Notes (Signed)
  Progress Note   Patient: Craig Freeman ZOX:[id number] DOB: [birth year] DOA: 08/17/2022     4 DOS: the patient was seen and examined on 08/22/2022   Brief hospital course: Craig Freeman is a 89 y.o. 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  Assessment and Plan: Symptomatic acute on chronic anemia: Anemia likely multifactorial from slow GI bleed and metastatic disease.  FOBT weakly positive.  No longer on any blood thinners.  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  Seen by GI, no plans for any intervention or scopes.  -Folic acid low at 5.5. Continue folic acid supplementation -Discussed with Oncology, recommendation to transfuse to keep Hgb >8 -Pt now s/p total 2 units PRBC's this visit -Recheck cbc in AM   Acute kidney injury: Resolved. Cont to follow bmet trends   Chronic thrombocytopenia: -Plts trended down to 19k -Pt given 1 unit plts 7/27 with improvement in platelets   Metastatic prostate cancer to bone: Follows with oncology Dr. Al Pimple.  Had been on active management with Zytiga, prednisone, and Xgeva.  Daughter recently reported that patient is still taking Nubeqa although appears this may have been discontinued by her oncologist on last visit. -Pt currently on Zytiga. Outpt documentation reviewed. Per 07/28/22 note by Dr. Al Pimple, pt was to continue on Zytiga and if he does not respond to [facility], then he should then proceed with comfort care and hospice -At present, pt noted to have dysphagia. Reportedly unable to tolerate PO Zytiga as a result -Have consulted Palliative Care to help reassess pt's goals of care   Hypertension: Blood pressure controlled.  Resumed atenolol  -Losartan remains on hold    Hyperlipidemia: Continue atorvastatin.   Dehydration:  -Given course of LR   Generalized weakness/debility/deconditioning: Appears to be very very weak to the point that he is unable to hold any conversation.   -Therapy recs for SNF noted, although d/c plan may change pending re-addressing pt's goals of care  Oral thrush -thrush plaques noted in mouth -continued on nystatin swish/swallow      Subjective: Without complaints today. Seems more alert  Physical Exam: Vitals:   08/22/22 0137 08/22/22 0215 08/22/22 0350 08/22/22 1352  BP: (!) 148/82 (!) 144/65 (!) 149/77 (!) 141/66  Pulse: 63 66 64 60  Resp: 18 18 18 16   Temp: 98.3 F (36.8 C) 97.7 F (36.5 C) 97.9 F (36.6 C) 97.8 F (36.6 C)  TempSrc: Oral Oral Oral Axillary  SpO2: 100%  100% 98%  Weight:      Height:       General exam: Conversant, in no acute distress Respiratory system: normal chest rise, clear, no audible wheezing Cardiovascular system: regular rhythm, s1-s2 Gastrointestinal system: Nondistended, nontender, pos BS Central nervous system: No seizures, no tremors Extremities: No cyanosis, no joint deformities Skin: No rashes, no pallor Psychiatry: Affect normal // no auditory hallucinations   Data Reviewed:  Labs reviewed: Na 140, K 3.5, Cr 0.97, WBC 4.8, Hgb 8.6, Plts 54   Family Communication: Pt in room, family not at bedside  Disposition: Status is: Inpatient Remains inpatient appropriate because: Severity of illness  Planned Discharge Destination: SNF     Author: Rickey Barbara, MD 08/22/2022 4:46 PM  For on call review [email or web address].

## 2022-08-23 DIAGNOSIS — D649 Anemia, unspecified: Secondary | ICD-10-CM | POA: Diagnosis not present

## 2022-08-23 DIAGNOSIS — N179 Acute kidney failure, unspecified: Secondary | ICD-10-CM | POA: Diagnosis not present

## 2022-08-23 LAB — TYPE AND SCREEN
ABO/RH(D): A NEG
Antibody Screen: NEGATIVE
Unit division: 0

## 2022-08-23 LAB — BPAM PLATELET PHERESIS
Blood Product Expiration Date: 202407292359
ISSUE DATE / TIME: 202407280152
Unit Type and Rh: 6200

## 2022-08-23 LAB — PREPARE PLATELET PHERESIS: Unit division: 0

## 2022-08-23 LAB — BPAM RBC
Blood Product Expiration Date: 202408042359
ISSUE DATE / TIME: 202407272254
Unit Type and Rh: 600

## 2022-08-23 MED ORDER — POTASSIUM CHLORIDE 10 MEQ/100ML IV SOLN
10.0000 meq | INTRAVENOUS | Status: AC
  Administered 2022-08-23 (×4): 10 meq via INTRAVENOUS
  Filled 2022-08-23 (×6): qty 100

## 2022-08-23 MED ORDER — POTASSIUM CHLORIDE 10 MEQ/100ML IV SOLN
10.0000 meq | INTRAVENOUS | Status: AC
  Administered 2022-08-23 (×2): 10 meq via INTRAVENOUS

## 2022-08-23 NOTE — Consult Note (Signed)
Consultation Note Date: 08/23/2022   Patient Name: Craig Freeman  DOB: [birth year]  MRN: [id number]  Age / Sex: 89 y.o., male  PCP: Georgann Housekeeper, MD Referring Physician: Jerald Kief, MD  Reason for Consultation: Establishing goals of care  HPI/Patient Profile: 89 y.o. male    admitted on 08/17/2022    Clinical Assessment and Goals of Care: 89 year old gentleman with metastatic prostate cancer to bone, follows with Dr. Al Pimple, has a history of chronic anemia thrombocytopenia hypertension dyslipidemia remote DVT. Patient admitted with symptomatic uterine acute on chronic anemia. Palliative consult for ongoing goals of care discussions. Chart reviewed, oncologic history reviewed.Per 07/28/22 note by Dr. Al Pimple, pt was to continue on Zytiga and if he does not respond to [facility], then he should then proceed with comfort care and hospice  SLP note reviewed.  Patient seen and examined.  Returned later to bedside when patient's daughter [name] arrived at bedside. Palliative medicine is specialized medical care for people living with serious illness. It focuses on providing relief from the symptoms and stress of a serious illness. The goal is to improve quality of life for both the patient and the family. Goals of care: Broad aims of medical therapy in relation to the patient's values and preferences. Our aim is to provide medical care aimed at enabling patients to achieve the goals that matter most to them, given the circumstances of their particular medical situation and their constraints.  Patient's daughter states that the patient was living by himself before this hospitalization.  He is using a cane and a walker.  Discussed about scope of current hospitalization, SLP recommendations and broad goals of care discussions were also undertaken.  See below   NEXT OF KIN  Daughter Noelle Penner [phone].    SUMMARY OF RECOMMENDATIONS   Agree with DO NOT RESUSCITATE Do recommend palliative services following the patient at skilled nursing facility for rehabilitation attempted Recommend palliative services at [facility] for additional support and for ongoing goals of care discussions. Thank you for the consult.  Code Status/Advance Care Planning: DNR   Symptom Management:     Palliative Prophylaxis:  Bowel Regimen    Psycho-social/Spiritual:  Desire for further Chaplaincy support:yes Additional Recommendations: Caregiving  Support/Resources  Prognosis:  Unable to determine  Discharge Planning: SNF rehab with palliative.       Primary Diagnoses: Present on Admission:  Symptomatic anemia  Hyperlipidemia  Hypertension  Prostate cancer metastatic to bone (HCC)  Thrombocytopenia (HCC)  Anemia   I have reviewed the medical record, interviewed the patient and family, and examined the patient. The following aspects are pertinent.  Past Medical History:  Diagnosis Date   Cancer (HCC)    Constipation    DVT (deep venous thrombosis), right    coumadin   High cholesterol    Hyperlipidemia    Hypertension    Prostate cancer metastatic to bone (HCC) 12/30/2021   Social History   Socioeconomic History   Marital status: Married    Spouse name:  Not on file   Number of children: Not on file   Years of education: Not on file   Highest education level: Not on file  Occupational History   Not on file  Tobacco Use   Smoking status: Never   Smokeless tobacco: Never  Substance and Sexual Activity   Alcohol use: No   Drug use: No   Sexual activity: Not Currently  Other Topics Concern   Not on file  Social History Narrative   Not on file   Social Determinants of Health   Financial Resource Strain: Not on file  Food Insecurity: No Food Insecurity (08/17/2022)   Hunger Vital Sign    Worried About Running Out of Food in the Last Year: Never true    Ran  Out of Food in the Last Year: Never true  Transportation Needs: No Transportation Needs (08/17/2022)   PRAPARE - Administrator, Civil Service (Medical): No    Lack of Transportation (Non-Medical): No  Physical Activity: Not on file  Stress: Not on file  Social Connections: Not on file   History reviewed. No pertinent family history. Scheduled Meds:  abiraterone acetate  250 mg Oral BID   atenolol  50 mg Oral Daily   atorvastatin  40 mg Oral Daily   feeding supplement  237 mL Oral BID BM   ferrous sulfate  325 mg Oral BID WC   folic acid  1 mg Oral Daily   mometasone-formoterol  2 puff Inhalation BID   nystatin  5 mL Mouth/Throat QID   predniSONE  5 mg Oral Q breakfast   Continuous Infusions:  cefTRIAXone (ROCEPHIN)  IV 1 g (08/22/22 1956)   doxycycline (VIBRAMYCIN) IV 100 mg (08/23/22 0900)   lactated ringers 75 mL/hr at 08/23/22 0854   potassium chloride 10 mEq (08/23/22 1248)   PRN Meds:.acetaminophen **OR** acetaminophen, albuterol, fluticasone, megestrol, ondansetron **OR** ondansetron (ZOFRAN) IV, mouth rinse, oxyCODONE, senna-docusate Medications Prior to Admission:  Prior to Admission medications   Medication Sig Start Date End Date Taking? Authorizing Provider  abiraterone acetate (ZYTIGA) 250 MG tablet Take 2 tablets (500 mg total) by mouth daily. Take on an empty stomach 1 hour before or 2 hours after a meal Patient taking differently: Take 250 mg by mouth in the morning and at bedtime. Take on an empty stomach 1 hour before or 2 hours after a meal 07/30/22  Yes Iruku, Burnice Logan, MD  atenolol (TENORMIN) 50 MG tablet Take 50 mg by mouth daily as needed (high blood pressure). 02/17/18  Yes [provider]  atorvastatin (LIPITOR) 40 MG tablet Take 40 mg by mouth in the morning.   Yes [provider]  chlorhexidine (PERIDEX) 0.12 % solution Use as directed 5 mLs in the mouth or throat 2 (two) times daily. 07/27/22  Yes [provider]  [facility]  MELATONIN 5 MG TABS Take 5 mg by mouth at bedtime as needed (for sleep). 09/15/17  Yes [provider]  darolutamide (NUBEQA) 300 MG tablet Take 600 mg by mouth 2 (two) times daily with a meal.   Yes [provider]  denosumab (XGEVA) 120 MG/1.7ML SOLN injection Inject 120 mg into the skin every 30 (thirty) days.   Yes [provider]  ferrous sulfate 325 (65 FE) MG tablet Take 325 mg by mouth 2 (two) times daily with a meal.   Yes [provider]  fluticasone (FLONASE) 50 MCG/ACT nasal spray Place 2 sprays into both nostrils daily as needed for allergies.  12/20/17  Yes [provider]  levocetirizine (XYZAL) 5 MG tablet Take 5 mg by mouth every evening. 10/05/17  Yes [provider]  losartan (COZAAR) 50 MG tablet Take 50 mg by mouth daily as needed (high blood pressure).   Yes [provider]  megestrol (MEGACE) 20 MG tablet Take 20 mg by mouth at bedtime as needed (for hot flashes).   Yes [provider]  ondansetron (ZOFRAN) 4 MG tablet Take 1 tablet (4 mg total) by mouth every 6 (six) hours as needed for nausea. 02/08/22  Yes Rai, Ripudeep K, MD  polyethylene glycol (MIRALAX) 17 g packet Take 17 g by mouth daily as needed for mild constipation or moderate constipation (also available OTC.). 07/18/22  Yes Sharl Ma, Sarina Ill, MD  predniSONE (DELTASONE) 5 MG tablet Take 1 tablet (5 mg total) by mouth daily with breakfast. 07/28/22  Yes Iruku, Burnice Logan, MD  SYSTANE COMPLETE PF 0.6 % SOLN Place 1 drop into both eyes in the morning and at bedtime.   Yes [provider]  triazolam (HALCION) 0.125 MG tablet Take 0.125 mg by mouth at bedtime as needed for sleep.   Yes [provider]  Vibegron (GEMTESA) 75 MG TABS Take 1 tablet by mouth every evening.   Yes [provider]  albuterol (PROVENTIL HFA;VENTOLIN HFA) 108 (90 BASE) MCG/ACT inhaler Inhale 1-2 puffs into the lungs every 6 (six) hours as needed for wheezing or  shortness of breath. Shortness of breath    [provider]   Allergies  Allergen Reactions   Amoxicillin Diarrhea   Augmentin [Amoxicillin-Pot Clavulanate] Diarrhea   Zithromax [Azithromycin] Other (See Comments)    "Blood in the stools"   Review of Systems +weakness  Physical Exam Elderly gentleman sitting up in a chair Regular work of breathing No edema Awake alert oriented Answers all questions appropriately No acute distress  Vital Signs: BP (!) 143/71 (BP Location: Right Arm)   Pulse 68   Temp 98.2 F (36.8 C)   Resp 18   Ht 5\' 4"  (1.626 m)   Wt 59 kg   SpO2 98%   BMI 22.31 kg/m  Pain Scale: 0-10   Pain Score: 0-No pain   SpO2: SpO2: 98 % O2 Device:SpO2: 98 % O2 Flow Rate: .   IO: Intake/output summary:  Intake/Output Summary (Last 24 hours) at 08/23/2022 1417 Last data filed at 08/23/2022 1000 Gross per 24 hour  Intake 100 ml  Output 300 ml  Net -200 ml    LBM: Last BM Date : 08/22/22 Baseline Weight: Weight: 59 kg Most recent weight: Weight: 59 kg     Palliative Assessment/Data:   PPS 50%  Time In:  1320 Time Out:  1420 Time Total:  60  Greater than 50%  of this time was spent counseling and coordinating care related to the above assessment and plan.  Signed by: Rosalin Hawking, MD   Please contact Palliative Medicine Team phone at [phone] for questions and concerns.  For individual provider: See Loretha Stapler

## 2022-08-23 NOTE — TOC Progression Note (Addendum)
Transition of Care [facility]) - Progression Note    Patient Details  Name: Craig Freeman MRN: [id number] Date of Birth: 06/13/1924  Transition of [facility]) CM/SW Contact  Beckie Busing, RN Phone [phone]  08/23/2022, 3:09 PM  Clinical Narrative:    Daughter requesting update on bed offers. CM at bedside to speak to daughter Craig Freeman. Daughters first choice for placement is [facility]. CM spoke with [facility] admissions director for SNF. Per [facility] she will review patients information and determine if she can offer bed. Daughter has been updated.   [id number]  [facility] can offer patient a bed when medically ready for discharge. Daughter has been updated. TOC will continue to follow for discharge readiness.         Expected Discharge Plan and Services                                               Social Determinants of Health (SDOH) Interventions SDOH Screenings   Food Insecurity: No Food Insecurity (08/17/2022)  Housing: Low Risk  (08/17/2022)  Transportation Needs: No Transportation Needs (08/17/2022)  Utilities: Not At Risk (08/17/2022)  Depression (PHQ2-9): Low Risk  (02/12/2022)  Tobacco Use: Low Risk  (08/17/2022)    Readmission Risk Interventions     No data to display

## 2022-08-23 NOTE — Progress Notes (Signed)
Physical Therapy Treatment Patient Details Name: Craig Freeman MRN: [id number] DOB: 10/05/1924 Today's Date: 08/23/2022   History of Present Illness Pt is a 89 yo male presenting to ED on 08/17/2022 due to generalized malaise, poor PO intake, lightheadedness and dizziness and limited ambulation. Pt was found to have low HgB of 6.7 and pt provided with I unit PRBC 7/23 and 7/24. CXR negative for pleural effusion and edema and focal consolidation however lesion to R humeral shaft is new and suspected scattered metastases to ribs.  Pt admitted to hospital ~ 1 month ago with rectal bleeding/GI bleed. Pt PMH includes but is not limited to: metastatic prostate cancer, thrombocytopenia, anemia, HTN, hx of DVT (warfarin), and HLD    PT Comments   Pt admitted with above diagnosis.  Pt currently with functional limitations due to the deficits listed below (see PT Problem List). Pt in bed when PT arrived, pt awake, alert and communicative today. Pt agreeable to therapy and getting OOB. Pt required min A and cues for supine to sit, min guard and cues for transfer tasks, gait in hallway with RW and min guard for 55 feet. Pt left in recliner and all needs in place. Pt is demonstrating progress and goals upgraded. Pt will benefit from acute skilled PT to increase their independence and safety with mobility to allow discharge.      If plan is discharge home, recommend the following: A lot of help with walking and/or transfers;A lot of help with bathing/dressing/bathroom;Assistance with cooking/housework;Direct supervision/assist for medications management;Direct supervision/assist for financial management;Assist for transportation;Help with stairs or ramp for entrance   Can travel by private vehicle        Equipment Recommendations  None recommended by PT    Recommendations for Other Services       Precautions / Restrictions Precautions Precautions: Fall Restrictions Weight Bearing Restrictions: No      Mobility  Bed Mobility Overal bed mobility: Needs Assistance Bed Mobility: Supine to Sit     Supine to sit: HOB elevated, Min assist     General bed mobility comments: pt in bed and awake/atert pt demonstrated improved abiltiy to follow commands and engage wtih therapy pt required min A and increased time for supine to sit with use of bed rails    Transfers Overall transfer level: Needs assistance Equipment used: Rolling walker (2 wheels) Transfers: Sit to/from Stand Sit to Stand: Min guard           General transfer comment: multimodal cues for proper UE and AD placement with bed, recliner and commode transfers    Ambulation/Gait Ambulation/Gait assistance: Min guard Gait Distance (Feet): 55 Feet Assistive device: Rolling walker (2 wheels) Gait Pattern/deviations: Step-to pattern, Trunk flexed, Narrow base of support, Decreased step length - left Gait velocity: decreased     General Gait Details: cues for attention to obstacles with turns and navigating doorways improved ability to perform RW management   Stairs             Wheelchair Mobility     Tilt Bed    Modified Rankin (Stroke Patients Only)       Balance Overall balance assessment: Needs assistance Sitting-balance support: Feet supported Sitting balance-Leahy Scale: Good     Standing balance support: Bilateral upper extremity supported, During functional activity, Reliant on assistive device for balance Standing balance-Leahy Scale: Poor  Cognition Arousal/Alertness: Awake/alert (More alert when edge of bed) Behavior During Therapy: Flat affect Overall Cognitive Status: Impaired/Different from baseline Area of Impairment: Attention, Following commands, Awareness, Orientation                 Orientation Level: Disoriented to, Time, Situation Current Attention Level: Sustained   Following Commands: Follows one step commands consistently    Awareness: Emergent   General Comments: At baseline, pt follows commands well, showed no meory deficits or impaired safety awanress.        Exercises      General Comments        Pertinent Vitals/Pain      Home Living Family/patient expects to be discharged to:: Private residence Living Arrangements: Alone Available Help at Discharge: Family;Available PRN/intermittently Type of Home: House Home Access: Stairs to enter Entrance Stairs-Rails: Can reach both;Left Entrance Stairs-Number of Steps: 4   Home Layout: One level Home Equipment: Agricultural consultant (2 wheels);Cane - single point      Prior Function            PT Goals (current goals can now be found in the care plan section) Acute Rehab PT Goals PT Goal Formulation: Patient unable to participate in goal setting    Frequency    Min 1X/week      PT Plan      Co-evaluation              AM-PAC PT "6 Clicks" Mobility   Outcome Measure  Help needed turning from your back to your side while in a flat bed without using bedrails?: A Little Help needed moving from lying on your back to sitting on the side of a flat bed without using bedrails?: A Little Help needed moving to and from a bed to a chair (including a wheelchair)?: A Little Help needed standing up from a chair using your arms (e.g., wheelchair or bedside chair)?: A Little Help needed to walk in hospital room?: A Little Help needed climbing 3-5 steps with a railing? : Total 6 Click Score: 16    End of Session Equipment Utilized During Treatment: Gait belt Activity Tolerance: Patient limited by fatigue Patient left: in chair;with call bell/phone within reach;with chair alarm set Nurse Communication: Mobility status PT Visit Diagnosis: Unsteadiness on feet (R26.81);Other abnormalities of gait and mobility (R26.89);Muscle weakness (generalized) (M62.81);Difficulty in walking, not elsewhere classified (R26.2)     Time: 0347-4259 PT Time  Calculation (min) (ACUTE ONLY): 31 min  Charges:    $Gait Training: 8-22 mins $Therapeutic Activity: 8-22 mins PT General Charges $$ ACUTE PT VISIT: 1 Visit                     Johnny Bridge, PT Acute Rehab    Jacqualyn Posey 08/23/2022, 10:55 AM

## 2022-08-23 NOTE — Plan of Care (Signed)

## 2022-08-23 NOTE — Progress Notes (Signed)
Speech Language Pathology Treatment: Dysphagia  Patient Details Name: Craig Freeman MRN: [id number] DOB: 09/14/1924 Today's Date: 08/23/2022 Time: 1250-1305 SLP Time Calculation (min) (ACUTE ONLY): 15 min  Assessment / Plan / Recommendation Clinical Impression  Patient seen by SLP for skilled treatment focused on dysphagia goals. He was awake, alert sitting in recliner and daughter in room. Patient was eating some PO's (magic cup supplement, water, etc). When he swallowed while drinking liquids, he grimaced and indicated that he had discomfort in his throat when he swallows (liquids and solids).  RN in room mentioned he was getting treatment for thrush. Likely, patient has thrush in pharynx which would explain his discomfort with swallowing. Cannot r/o structural abnormality such as cervical osteophytes or prominent cricopharyngeal bar as potential causes of patient's swallowing discomfort. Patient did report feeling that "the pipe is getting full", referring to his esophagus. Daughter told SLP that patient has been having this discomfort while swallowing for the past two weeks. In addition, he has been on pureed diet as he declined to go to a scheduled appointment for a dental procedure to repair his dental bridge. She has been able to get him to eat/drink some throughout the day here at the hospital. SLP will briefly follow.    HPI HPI: Craig Freeman is a 89 y.o. male with medical history significant for metastatic prostate cancer to bone on abiraterone, chronic anemia and thrombocytopenia, HTN, HLD, remote DVT off anticoagulation due to GI bleeding who presented to the ED for evaluation of fatigue and generalized weakness. Also has been having occasional dry cough and clear rhinorrhea.  He is no longer on any anticoagulation due to GI bleed last month. Coughing with po PTA  very weak - unable to hold conversation.   in January 2024 and June 2024 for anemia in setting of hematochezia with  anticoagulation, difficulty with secretions , chronic changes at lung base - nothing acute 7/23.  Swallow eval ordered due to pt having issues managing secretions and some coughing with intake.  Prior brain imaging showed "Mild atrophy.  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  Recommendations may be updated based on patient status, additional functional criteria and insurance authorization.    Recommendations  Diet recommendations: Dysphagia 1 (puree);Thin liquid Liquids provided via: Cup;Straw Medication Administration: Crushed with puree Supervision: Patient able to self feed;Trained caregiver to feed patient Compensations: Slow rate;Small sips/bites Postural Changes and/or Swallow Maneuvers: Seated upright 90 degrees;Upright 30-60 min after meal                  Oral care before and after PO   Frequent or constant Supervision/Assistance Dysphagia, unspecified (R13.10);Dysphagia, oropharyngeal phase (R13.12)     Continue with current plan of care    Angela Nevin, MA, CCC-SLP Speech Therapy

## 2022-08-23 NOTE — Progress Notes (Signed)
  Progress Note   Patient: Craig Freeman HYQ:[id number] DOB: 08-11-1924 DOA: 08/17/2022     5 DOS: the patient was seen and examined on 08/23/2022   Brief hospital course: Craig Freeman is a 89 y.o. 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  Assessment and Plan: Symptomatic acute on chronic anemia: Anemia likely multifactorial from slow GI bleed and metastatic disease.  FOBT weakly positive.  No longer on any blood thinners.  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  Seen by GI, no plans for any intervention or scopes.  -Folic acid low at 5.5. Continue folic acid supplementation -Discussed with Oncology, recommendation to transfuse to keep Hgb >8 -Pt now s/p total 2 units PRBC's this visit. Hgb stable   Acute kidney injury: Resolved. Cont to follow bmet trends   Chronic thrombocytopenia: -Plts trended down to 19k -Pt given 1 unit plts 7/27 with improvement in platelets   Metastatic prostate cancer to bone: Follows with oncology Dr. Al Pimple.  Had been on active management with Zytiga, prednisone, and Xgeva.  Daughter recently reported that patient is still taking Nubeqa although appears this may have been discontinued by her oncologist on last visit. -Pt currently on Zytiga. Outpt documentation reviewed. Per 07/28/22 note by Dr. Al Pimple, pt was to continue on Zytiga and if he does not respond to [facility], then he should then proceed with comfort care and hospice -At present, pt noted to have dysphagia. Reportedly unable to tolerate PO Zytiga as a result -Appreciate Palliative Care input. Recs for Palliative Care services to follow pt at SNF   Hypertension: Blood pressure controlled.  Resumed atenolol  -Losartan remains on hold    Hyperlipidemia: Continue atorvastatin.   Dehydration:  -Given course of LR   Generalized weakness/debility/deconditioning: Appears to be very very weak to the point that he is unable to hold any conversation.   -Therapy recs for SNF noted, TOC following  Oral thrush -thrush plaques noted in mouth -continued on nystatin swish/swallow  Hypokalemia -Replaced      Subjective: Seen as pt is receiving meds. Without complaints  Physical Exam: Vitals:   08/22/22 1352 08/22/22 2130 08/23/22 0505 08/23/22 1220  BP: (!) 141/66 (!) 143/60 (!) 162/61 (!) 143/71  Pulse: 60 64 64 68  Resp: 16 17 20 18   Temp: 97.8 F (36.6 C) 98.1 F (36.7 C) 98.7 F (37.1 C) 98.2 F (36.8 C)  TempSrc: Axillary Oral Oral   SpO2: 98% 97% 98% 98%  Weight:      Height:       General exam: Awake, laying in bed, in nad Respiratory system: Normal respiratory effort, no wheezing Cardiovascular system: regular rate, s1, s2 Gastrointestinal system: Soft, nondistended, positive BS Central nervous system: CN2-12 grossly intact, strength intact Extremities: Perfused, no clubbing Skin: Normal skin turgor, no notable skin lesions seen Psychiatry: Mood normal // no visual hallucinations   Data Reviewed:  Labs reviewed: Na 140, K 3.1, Cr 0.80, WBC 5.5, Hgb 9.0, Plts 39   Family Communication: Pt in room, family not at bedside  Disposition: Status is: Inpatient Remains inpatient appropriate because: Severity of illness  Planned Discharge Destination: SNF     Author: Rickey Barbara, MD 08/23/2022 5:00 PM  For on call review [email or web address].

## 2022-08-24 ENCOUNTER — Other Ambulatory Visit: Payer: Self-pay

## 2022-08-24 DIAGNOSIS — N179 Acute kidney failure, unspecified: Secondary | ICD-10-CM | POA: Diagnosis not present

## 2022-08-24 DIAGNOSIS — D649 Anemia, unspecified: Secondary | ICD-10-CM | POA: Diagnosis not present

## 2022-08-24 MED ORDER — POTASSIUM CHLORIDE 10 MEQ/100ML IV SOLN
10.0000 meq | INTRAVENOUS | Status: AC
  Administered 2022-08-24 (×2): 10 meq via INTRAVENOUS
  Filled 2022-08-24 (×2): qty 100

## 2022-08-24 MED ORDER — MELATONIN 5 MG PO TABS
5.0000 mg | ORAL_TABLET | Freq: Once | ORAL | Status: AC
  Administered 2022-08-24: 5 mg via ORAL
  Filled 2022-08-24: qty 1

## 2022-08-24 MED ORDER — POTASSIUM CHLORIDE 10 MEQ/100ML IV SOLN
10.0000 meq | INTRAVENOUS | Status: AC
  Administered 2022-08-24 (×4): 10 meq via INTRAVENOUS
  Filled 2022-08-24: qty 100

## 2022-08-24 NOTE — Progress Notes (Signed)
Progress Note   Patient: Craig Freeman ZHY:[id number] DOB: 09-17-1924 DOA: 08/17/2022     6 DOS: the patient was seen and examined on 08/24/2022   Brief hospital course: Craig Freeman is a 89 y.o. 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  Assessment and Plan: Symptomatic acute on chronic anemia: Anemia likely multifactorial from slow GI bleed and metastatic disease.  FOBT weakly positive.  No longer on any blood thinners.  But has oncologist Craig Freeman, His anemia is likely from bone marrow replacement by carcinoma.  Seen by GI, no plans for any intervention or scopes.  -Folic acid low at 5.5. Continue folic acid supplementation -Discussed with Oncology, recommendation to transfuse to keep Hgb >8 -Pt now s/p total 2 units PRBC's this visit. Hgb remains stable   Acute kidney injury: Resolved. Cont to follow bmet trends   Chronic thrombocytopenia: -Pt did require 1 unit plts for plt of 19k over the weekend -Plts continues to trend down. Currently 25k -Discussed with Oncology, recs to transfuse to keep plts >20k.   Metastatic prostate cancer to bone: Follows with oncology Craig Freeman.  Had been on active management with Zytiga, prednisone, and Xgeva.  Daughter recently reported that patient is still taking Nubeqa although appears this may have been discontinued by her oncologist on last visit. -Pt currently on Zytiga. Outpt documentation reviewed. Per 07/28/22 note by Craig Freeman, pt was to continue on Zytiga and if he does not respond to [facility], then he should then proceed with comfort care and hospice -At present, pt noted to have dysphagia. Reportedly unable to tolerate PO Zytiga as a result -Appreciate Palliative Care input. Recs for Palliative Care services to follow pt at  SNF -Discussed with Oncology on call who will see today   Hypertension: Blood pressure controlled.  Resumed atenolol  -Losartan remains on hold   Hyperlipidemia: Continue atorvastatin.   Dehydration:  -Given course of LR   Generalized weakness/debility/deconditioning: Appears to be very very weak to the point that he is unable to hold any conversation.   -Therapy recs for SNF noted, TOC following  Oral thrush -thrush plaques noted in mouth -continued on nystatin swish/swallow  Hypokalemia -Replaced  Possible PNA -Recent low grade fevers noted -CXR reviewed. Findings suggestive of patchy opacity at left base -Empirically cover with rocephin and doxycycline x 5 days to cover PNA      Subjective: No complaints this AM. Eager to go be discharged soon  Physical Exam: Vitals:   08/23/22 2050 08/24/22 0501 08/24/22 0900 08/24/22 1328  BP: (!) 155/69 (!) 141/65  (!) 180/75  Pulse: 69 62  71  Resp: 14 16  16   Temp: 98.5 F (36.9 C) 98.2 F (36.8 C)  97.8 F (36.6 C)  TempSrc: Oral Oral  Axillary  SpO2: 99% 100% 98% 100%  Weight:      Height:       General exam: Conversant, in no acute distress Respiratory system: normal chest rise, clear, no audible wheezing Cardiovascular system: regular rhythm, s1-s2 Gastrointestinal system: Nondistended, nontender, pos BS Central nervous system: No seizures, no tremors Extremities: No cyanosis, no joint deformities Skin: No rashes, no pallor Psychiatry: Affect normal // no auditory hallucinations   Data Reviewed:  Labs reviewed: Na 137, K 3.5, Cr 0.84, WBC 5.7, Hgb 8.6   Family Communication: Pt in  room, family at bedside  Disposition: Status is: Inpatient Remains inpatient appropriate because: Severity of illness  Planned Discharge Destination: SNF     Author: Rickey Barbara, MD 08/24/2022 3:10 PM  For on call review [email or web address].

## 2022-08-24 NOTE — Progress Notes (Signed)
Craig Freeman   DOB:1924-12-19   XB#:[id number]   ZHY#:[id number]  Hem/onc follow up   Subjective: Pt is known to our service, under Dr. Melchor Amour care for his metastatic prostate cancer.  He was last seen in the office by Dr. Al Pimple on 07/28/22. Due to disease progression on Xofigo, Dr. Al Pimple recommended changing to zytiga and prednisone, but he was not told to stop Nebeqa and he was taking before hospital admission. He was admitted for acute on chronic anemia, and worsening thrombocytopenia.   Objective:  Vitals:   08/24/22 1328 08/24/22 2021  BP: (!) 180/75   Pulse: 71   Resp: 16   Temp: 97.8 F (36.6 C)   SpO2: 100% 99%    Body mass index is 22.31 kg/m.  Intake/Output Summary (Last 24 hours) at 08/24/2022 2059 Last data filed at 08/24/2022 1823 Gross per 24 hour  Intake 150 ml  Output 2000 ml  Net -1850 ml     Sclerae unicteric  Oropharynx clear  No peripheral adenopathy  Lungs clear -- no rales or rhonchi  Heart regular rate and rhythm  Abdomen benign  MSK no focal spinal tenderness, no peripheral edema  Neuro nonfocal    CBG (last 3)  Recent Labs    08/21/22 2100  GLUCAP 104*     Labs:   Urine Studies No results for input(s): "UHGB", "CRYS" in the last 72 hours.  Invalid input(s): "UACOL", "UAPR", "USPG", "UPH", "UTP", "UGL", "UKET", "UBIL", "UNIT", "UROB", "ULEU", "UEPI", "UWBC", "URBC", "UBAC", "CAST", "UCOM", "BILUA"  Basic Metabolic Panel: Recent Labs  Lab 08/19/22 0528 08/21/22 0815 08/22/22 0721 08/23/22 0559 08/24/22 0558  NA 145 141 140 140 137  K 3.5 2.9* 3.5 3.1* 3.5  CL 110 106 107 107 107  CO2 22 25 23  21* 21*  GLUCOSE 88 92 83 89 96  BUN 28* 35* 31* 24* 22  CREATININE 1.00 1.08 0.97 0.80 0.84  CALCIUM 8.8* 8.1* 7.9* 7.8* 7.8*   GFR Estimated Creatinine Clearance: 42.9 mL/min (by C-G formula based on SCr of 0.84 mg/dL). Liver Function Tests: Recent Labs  Lab 08/21/22 0815 08/22/22 0721 08/23/22 0559 08/24/22 0558  AST 65* 61* 62*  77*  ALT 33 35 36 46*  ALKPHOS 48 50 52 59  BILITOT 1.1 1.3* 1.0 1.4*  PROT 5.3* 5.4* 5.4* 5.6*  ALBUMIN 2.6* 2.6* 2.6* 2.7*   No results for input(s): "LIPASE", "AMYLASE" in the last 168 hours. No results for input(s): "AMMONIA" in the last 168 hours. Coagulation profile No results for input(s): "INR", "PROTIME" in the last 168 hours.  CBC: Recent Labs  Lab 08/19/22 0528 08/20/22 0530 08/21/22 0815 08/22/22 0721 08/23/22 0559 08/24/22 0558  WBC 5.3 5.3 5.5 4.8 5.5 5.7  NEUTROABS 3.0  --   --   --   --   --   HGB 8.4* 8.2* 7.5* 8.6* 9.0* 8.6*  HCT 26.5* 26.1* 23.6* 26.2* 27.1* 26.7*  MCV 93.6 92.6 93.3 91.9 92.2 92.4  PLT 31* 22* 19* 54* 39* 25*   Cardiac Enzymes: No results for input(s): "CKTOTAL", "CKMB", "CKMBINDEX", "TROPONINI" in the last 168 hours. BNP: Invalid input(s): "POCBNP" CBG: Recent Labs  Lab 08/21/22 2100  GLUCAP 104*   D-Dimer No results for input(s): "DDIMER" in the last 72 hours. Hgb A1c No results for input(s): "HGBA1C" in the last 72 hours. Lipid Profile No results for input(s): "CHOL", "HDL", "LDLCALC", "TRIG", "CHOLHDL", "LDLDIRECT" in the last 72 hours. Thyroid function studies No results for input(s): "TSH", "T4TOTAL", "T3FREE", "  THYROIDAB" in the last 72 hours.  Invalid input(s): "FREET3" Anemia work up No results for input(s): "VITAMINB12", "FOLATE", "FERRITIN", "TIBC", "IRON", "RETICCTPCT" in the last 72 hours. Microbiology Recent Results (from the past 240 hour(s))  Resp panel by RT-PCR (RSV, Flu A&B, Covid) Anterior Nasal Swab     Status: None   Collection Time: 08/17/22  7:14 PM   Specimen: Anterior Nasal Swab  Result Value Ref Range Status   SARS Coronavirus 2 by RT PCR NEGATIVE NEGATIVE Final    Comment: (NOTE) SARS-CoV-2 target nucleic acids are NOT DETECTED.  The SARS-CoV-2 RNA is generally detectable in upper respiratory specimens during the acute phase of infection. The lowest concentration of SARS-CoV-2 viral copies  this assay can detect is 138 copies/mL. A negative result does not preclude SARS-Cov-2 infection and should not be used as the sole basis for treatment or other patient management decisions. A negative result may occur with  improper specimen collection/handling, submission of specimen other than nasopharyngeal swab, presence of viral mutation(s) within the areas targeted by this assay, and inadequate number of viral copies(<138 copies/mL). A negative result must be combined with clinical observations, patient history, and epidemiological information. The expected result is Negative.  Fact Sheet for Patients:  BloggerCourse.com  Fact Sheet for Healthcare Providers:  SeriousBroker.it  This test is no t yet approved or cleared by the [place] FDA and  has been authorized for detection and/or diagnosis of SARS-CoV-2 by FDA under an Emergency Use Authorization (EUA). This EUA will remain  in effect (meaning this test can be used) for the duration of the COVID-19 declaration under Section 564(b)(1) of the Act, 21 U.S.C.section 360bbb-3(b)(1), unless the authorization is terminated  or revoked sooner.       Influenza A by PCR NEGATIVE NEGATIVE Final   Influenza B by PCR NEGATIVE NEGATIVE Final    Comment: (NOTE) The Xpert Xpress SARS-CoV-2/FLU/RSV plus assay is intended as an aid in the diagnosis of influenza from Nasopharyngeal swab specimens and should not be used as a sole basis for treatment. Nasal washings and aspirates are unacceptable for Xpert Xpress SARS-CoV-2/FLU/RSV testing.  Fact Sheet for Patients: BloggerCourse.com  Fact Sheet for Healthcare Providers: SeriousBroker.it  This test is not yet approved or cleared by the [place] FDA and has been authorized for detection and/or diagnosis of SARS-CoV-2 by FDA under an Emergency Use Authorization (EUA). This EUA  will remain in effect (meaning this test can be used) for the duration of the COVID-19 declaration under Section 564(b)(1) of the Act, 21 U.S.C. section 360bbb-3(b)(1), unless the authorization is terminated or revoked.     Resp Syncytial Virus by PCR NEGATIVE NEGATIVE Final    Comment: (NOTE) Fact Sheet for Patients: BloggerCourse.com  Fact Sheet for Healthcare Providers: SeriousBroker.it  This test is not yet approved or cleared by the [place] FDA and has been authorized for detection and/or diagnosis of SARS-CoV-2 by FDA under an Emergency Use Authorization (EUA). This EUA will remain in effect (meaning this test can be used) for the duration of the COVID-19 declaration under Section 564(b)(1) of the Act, 21 U.S.C. section 360bbb-3(b)(1), unless the authorization is terminated or revoked.  Performed at [facility], [address], [place], [place] [address]       Studies:  No results found.  Assessment: 89 y.o. male   Symptomatic acute on chronic anemia, secondary to bone metastasis from prostate cancer, and GI bleeding.  He also has a mildly low folate, on folic acid  now. AKI, resolved Chronic severe thrombocytopenia, secondary to bone metastasis History of breast cancer to bone Hypertension Generalized weakness and deconditioning    Plan:  -Agree with blood transfusion to keep hemoglobin above 8.5, and platelet transfusion to keep platelets above 20 K -I spoke with patient and his daughter, they are not ready for hospice -Continue Zytiga at the low-dose to 50 mg twice daily, and prednisone 5 mg daily.  I told the patient he does not need Nebeqa when he is on Zytiga. -Unfortunately patient has no additional treatment options, his prognosis is very poor, he has been on Zytiga for months, and no significant clinical response.  He is hospice appropriate. -He has agreed with DNR -If he goes to  rehab, will continue monitoring his blood counts, and give blood transfusion as needed.  He has appointment with [place] on August 31, 2022, I will add labs and blood transfusion appointments.  I will see him in clinic. -I will f/u as needed.    Malachy Mood, MD 08/24/2022

## 2022-08-24 NOTE — TOC Progression Note (Signed)
Transition of Care [facility]) - Progression Note    Patient Details  Name: Craig Freeman MRN: [id number] Date of Birth: 12-May-1924  [facility] CM/SW Contact  Beckie Busing, RN Phone [phone]  08/24/2022, 10:49 AM  Clinical Narrative:    CM followed up with MD to update on bed offer from [facility]. Per MD patient is not ready today but could possibly be ready tomorrow. [facility] admissions director with [facility] has been updated. TOC will continue to follow for discharge appropriateness.         Expected Discharge Plan and Services                                               Social Determinants of Health (SDOH) Interventions SDOH Screenings   Food Insecurity: No Food Insecurity (08/17/2022)  Housing: Low Risk  (08/17/2022)  Transportation Needs: No Transportation Needs (08/17/2022)  Utilities: Not At Risk (08/17/2022)  Depression (PHQ2-9): Low Risk  (02/12/2022)  Tobacco Use: Low Risk  (08/17/2022)    Readmission Risk Interventions     No data to display

## 2022-08-24 NOTE — Care Management Important Message (Signed)
Important Message  Patient Details IM Letter placed in room for Patients Daughter Noelle Penner. Name: KJELL KIELAR MRN: [id number] Date of Birth: 07-20-1924   Medicare Important Message Given:  Yes     [name] 08/24/2022, 9:12 AM

## 2022-08-25 ENCOUNTER — Other Ambulatory Visit: Payer: Self-pay

## 2022-08-25 DIAGNOSIS — D649 Anemia, unspecified: Secondary | ICD-10-CM | POA: Diagnosis not present

## 2022-08-25 DIAGNOSIS — C61 Malignant neoplasm of prostate: Secondary | ICD-10-CM

## 2022-08-25 LAB — BPAM PLATELET PHERESIS
Blood Product Expiration Date: 202408012359
Blood Product Expiration Date: 202408032359
Unit Type and Rh: 5100
Unit Type and Rh: 6200

## 2022-08-25 LAB — PREPARE PLATELET PHERESIS
Unit division: 0
Unit division: 0

## 2022-08-25 LAB — CBC
HCT: 24.6 % — ABNORMAL LOW (ref 39.0–52.0)
Hemoglobin: 8 g/dL — ABNORMAL LOW (ref 13.0–17.0)
MCH: 30.3 pg (ref 26.0–34.0)
MCHC: 32.5 g/dL (ref 30.0–36.0)
MCV: 93.2 fL (ref 80.0–100.0)
Platelets: 13 10*3/uL — CL (ref 150–400)
RBC: 2.64 MIL/uL — ABNORMAL LOW (ref 4.22–5.81)
RDW: 17.8 % — ABNORMAL HIGH (ref 11.5–15.5)
WBC: 6 10*3/uL (ref 4.0–10.5)
nRBC: 15.2 % — ABNORMAL HIGH (ref 0.0–0.2)

## 2022-08-25 MED ORDER — SODIUM CHLORIDE 0.9% IV SOLUTION: Freq: Once | INTRAVENOUS | Status: DC

## 2022-08-25 MED ORDER — TRIAZOLAM 0.125 MG PO TABS: 0.1250 mg | ORAL_TABLET | Freq: Every evening | ORAL | Status: DC | PRN

## 2022-08-25 MED ORDER — CARMEX CLASSIC LIP BALM EX OINT
TOPICAL_OINTMENT | CUTANEOUS | Status: DC | PRN
  Filled 2022-08-25 (×2): qty 10

## 2022-08-25 MED ORDER — TRAZODONE HCL 50 MG PO TABS
25.0000 mg | ORAL_TABLET | Freq: Every evening | ORAL | Status: DC | PRN
  Administered 2022-08-25: 25 mg via ORAL
  Filled 2022-08-25: qty 1

## 2022-08-25 NOTE — Progress Notes (Signed)
Physical Therapy Treatment Patient Details Name: Craig Freeman MRN: [id number] DOB: 12-03-1924 Today's Date: 08/25/2022   History of Present Illness Pt is a 89 yo male presenting to ED on 08/17/2022 due to generalized malaise, poor PO intake, lightheadedness and dizziness and limited ambulation. Pt was found to have low HgB of 6.7 and pt provided with I unit PRBC 7/23 and 7/24. CXR negative for pleural effusion and edema and focal consolidation however lesion to R humeral shaft is new and suspected scattered metastases to ribs.  Pt admitted to hospital ~ 1 month ago with rectal bleeding/GI bleed. Pt PMH includes but is not limited to: metastatic prostate cancer, thrombocytopenia, anemia, HTN, hx of DVT (warfarin), and HLD    PT Comments  Pt received in recliner, lethargic due to being up during the night. Pt required ModA for sit<>stand transfers. Once standing, pt struggled to side step with therapist to bed due to difficulty following simple commands. Sitting EOB with feet supported x 2 minutes, no LOB. MaxA sit to supine due to fatigue and overall weakness. Session modified due to pt's fatigue level and concerns for bleeding risk with trending down platelet levels and lack of completed labs this date.  Secure chat to full medical team with concerns for  weight bearing/activity level restrictions with presence of progressive bone mets throughout axillary and appendicular skeleton with potential risk for fractures. Awaiting f/u. Family states pt has a high pain tolerance and may not accurately verbalize to staff.    If plan is discharge home, recommend the following: A lot of help with walking and/or transfers;A lot of help with bathing/dressing/bathroom;Assistance with cooking/housework;Direct supervision/assist for medications management;Direct supervision/assist for financial management;Assist for transportation;Help with stairs or ramp for entrance   Can travel by private vehicle         Equipment Recommendations  None recommended by PT    Recommendations for Other Services       Precautions / Restrictions Precautions Precautions: Fall;Other (comment) (Risk for bleeding due to low platelets) Precaution Comments:  (Limit activity, awaiting response from Oncology) Restrictions Weight Bearing Restrictions: No Other Position/Activity Restrictions:  (Currently no wt bearing restrictions, awaiting Oncology to respond to secure chat from this date)     Mobility  Bed Mobility Overal bed mobility: Needs Assistance Bed Mobility: Sit to Supine       Sit to supine: Max assist   General bed mobility comments: Increased assistance needed for bed mobilty    Transfers Overall transfer level: Needs assistance Equipment used: None Transfers: Sit to/from Stand Sit to Stand: Mod assist Stand pivot transfers: Mod assist         General transfer comment: multimodal cues to safely side step with therapist from chair to bed, no AD    Ambulation/Gait               General Gait Details: deferred   Stairs             Wheelchair Mobility     Tilt Bed    Modified Rankin (Stroke Patients Only)       Balance Overall balance assessment: Needs assistance Sitting-balance support: Feet supported Sitting balance-Leahy Scale: Fair     Standing balance support: Bilateral upper extremity supported, During functional activity Standing balance-Leahy Scale: Poor                              Cognition Arousal/Alertness: Lethargic Behavior During Therapy: Flat affect Overall  Cognitive Status: Impaired/Different from baseline Area of Impairment: Attention, Following commands, Awareness, Orientation                 Orientation Level: Disoriented to, Time, Situation Current Attention Level: Sustained   Following Commands: Follows one step commands inconsistently   Awareness: Emergent   General Comments: Pt required increased time to  transfer chair to bed, difficulty with following simple step verbal and visual commands        Exercises      General Comments General comments (skin integrity, edema, etc.):  (Discussed role of PT with pt's daughter (pt lethargic).Education provided at length regarding current concerns with progressive bone mets and downward trending platelet level. Secure chat to multidisciplinary team regarding concerns for mobility/ fx risk)      Pertinent Vitals/Pain Pain Assessment Pain Assessment: PAINAD Breathing: occasional labored breathing, short period of hyperventilation Negative Vocalization: none Facial Expression: facial grimacing Body Language: relaxed Consolability: no need to console PAINAD Score: 3 Pain Location:  (Unable to verbalize) Pain Descriptors / Indicators: Discomfort    Home Living                          Prior Function            PT Goals (current goals can now be found in the care plan section) Progress towards PT goals: PT to reassess next treatment    Frequency    Min 1X/week      PT Plan      Co-evaluation              AM-PAC PT "6 Clicks" Mobility   Outcome Measure  Help needed turning from your back to your side while in a flat bed without using bedrails?: A Little Help needed moving from lying on your back to sitting on the side of a flat bed without using bedrails?: A Little Help needed moving to and from a bed to a chair (including a wheelchair)?: A Little Help needed standing up from a chair using your arms (e.g., wheelchair or bedside chair)?: A Little Help needed to walk in hospital room?: A Little Help needed climbing 3-5 steps with a railing? : Total 6 Click Score: 16    End of Session Equipment Utilized During Treatment: Gait belt Activity Tolerance: Patient limited by fatigue Patient left: in bed;with call bell/phone within reach;with bed alarm set;with family/visitor present Nurse Communication: Mobility  status;Other (comment) (Concern for progressive bone mets and risk for fractures.) PT Visit Diagnosis: Unsteadiness on feet (R26.81);Other abnormalities of gait and mobility (R26.89);Muscle weakness (generalized) (M62.81);Difficulty in walking, not elsewhere classified (R26.2)     Time: 1430-1505 PT Time Calculation (min) (ACUTE ONLY): 35 min  Charges:    $Therapeutic Activity: 23-37 mins PT General Charges $$ ACUTE PT VISIT: 1 Visit                    Zadie Cleverly, PTA  Jannet Askew 08/25/2022, 5:13 PM

## 2022-08-25 NOTE — Progress Notes (Signed)
Occupational Therapy Treatment Patient Details Name: Craig Freeman MRN: [id number] DOB: 1924/04/19 Today's Date: 08/25/2022   History of present illness Pt is a 89 yo male presenting to ED on 08/17/2022 due to generalized malaise, poor PO intake, lightheadedness and dizziness and limited ambulation. Pt was found to have low HgB of 6.7 and pt provided with I unit PRBC 7/23 and 7/24. CXR negative for pleural effusion and edema and focal consolidation however lesion to R humeral shaft is new and suspected scattered metastases to ribs.  Pt admitted to hospital ~ 1 month ago with rectal bleeding/GI bleed. Pt PMH includes but is not limited to: metastatic prostate cancer, thrombocytopenia, anemia, HTN, hx of DVT (warfarin), and HLD   OT comments  Patient continues to make progress towards goals. Patient was able to complete transfers with reduced A compared to previous session. Patient continues to have poor functional activity tolerance, decreased standing balance, and decreased safety awareness impacting participation in ADLs. Patient was noted to have bleeding lips at end of session after taking one bite of food. Nurse made aware. NT in room to assist patient with self feeding supervision at end of session. Patient will benefit from continued inpatient follow up therapy, <3 hours/day    Recommendations for follow up therapy are one component of a multi-disciplinary discharge planning process, led by the attending physician.  Recommendations may be updated based on patient status, additional functional criteria and insurance authorization.    Assistance Recommended at Discharge Frequent or constant Supervision/Assistance  Patient can return home with the following  A little help with bathing/dressing/bathroom;Two people to help with walking and/or transfers;A little help with walking and/or transfers;Direct supervision/assist for medications management;Direct supervision/assist for financial  management;Assist for transportation;Assistance with cooking/housework;Assistance with feeding;Help with stairs or ramp for entrance   Equipment Recommendations  None recommended by OT       Precautions / Restrictions Precautions Precautions: Fall Restrictions Weight Bearing Restrictions: No       Mobility Bed Mobility Overal bed mobility: Needs Assistance Bed Mobility: Supine to Sit     Supine to sit: HOB elevated, Min assist                   Balance Overall balance assessment: Needs assistance Sitting-balance support: Feet supported Sitting balance-Leahy Scale: Good     Standing balance support: Bilateral upper extremity supported, During functional activity, Reliant on assistive device for balance Standing balance-Leahy Scale: Poor         ADL either performed or assessed with clinical judgement   ADL Overall ADL's : Needs assistance/impaired Eating/Feeding: Cueing for sequencing;Cueing for safety;Set up;Supervision/ safety Eating/Feeding Details (indicate cue type and reason): sitting in recliner in room.  patient was noed to have red on spoon and bottle of water after small sip with cues to slow down. patient noted to have bleeding on upper and lower lip on L side of lips. patient reported it started last night. no bleeding was noted prior to using spoon for beginning of meal. nurse made aware. nurse to communicate with MD. NT in room to assist patient with finishing meal at end of session.                 Lower Body Dressing: Maximal assistance;Sitting/lateral leans Lower Body Dressing Details (indicate cue type and reason): patient was max A for donning socks with patient able to cross legs bilaterally but not lift ankle up to lap Toilet Transfer: Minimal assistance;Ambulation;Cueing for safety;Cueing for sequencing;Rolling walker (2  wheels) Toilet Transfer Details (indicate cue type and reason): to recliner in room with incresed time.                   Cognition Arousal/Alertness: Awake/alert Behavior During Therapy: Flat affect Overall Cognitive Status: Impaired/Different from baseline     General Comments: patient able to follow commands. atempted to tell story but unable to complete thought.                   Pertinent Vitals/ Pain       Pain Assessment Pain Assessment: Faces Faces Pain Scale: No hurt         Frequency  Min 1X/week        Progress Toward Goals  OT Goals(current goals can now be found in the care plan section)  Progress towards OT goals: Progressing toward goals     Plan Discharge plan remains appropriate       AM-PAC OT "6 Clicks" Daily Activity     Outcome Measure   Help from another person eating meals?: A Little Help from another person taking care of personal grooming?: A Little Help from another person toileting, which includes using toliet, bedpan, or urinal?: A Lot Help from another person bathing (including washing, rinsing, drying)?: A Lot Help from another person to put on and taking off regular upper body clothing?: A Little Help from another person to put on and taking off regular lower body clothing?: Total 6 Click Score: 14    End of Session Equipment Utilized During Treatment: Gait belt;Rolling walker (2 wheels)  OT Visit Diagnosis: Unsteadiness on feet (R26.81);Other symptoms and signs involving cognitive function;Muscle weakness (generalized) (M62.81)   Activity Tolerance Patient limited by fatigue   Patient Left in chair;with call bell/phone within reach;with chair alarm set;with nursing/sitter in room (NT in room)   Nurse Communication Other (comment) (nurse in room during session)        Time: [phone] OT Time Calculation (min): 19 min  Charges: OT General Charges $OT Visit: 1 Visit OT Treatments $Self Care/Home Management : 8-22 mins  Rosalio Loud, MS Acute Rehabilitation Department Office# [phone]   Selinda Flavin 08/25/2022, 9:07  AM

## 2022-08-25 NOTE — Progress Notes (Signed)
PROGRESS NOTE    Craig Freeman  ZOX:[id number] DOB: May 31, 1924 DOA: 08/17/2022 PCP: Georgann Housekeeper, MD   Brief Narrative:    Assessment and Plan:  Acute on chronic anemia Symptomatic anemia Concern for multifactorial etiology related to slow GI bleeding and metastatic disease causing bone marrow replacement.  Patient is currently not on anticoagulation.  Patient with fecal occult positive blood test.  Patient has required a total of 2 units of PRBC via blood transfusion this admission.  GI was consulted and recommended no plan for intervention, including no endoscopy procedure. -Continue conservative management -Continue folic acid -Transfuse to keep hemoglobin greater than 8 g/dL per oncology recommendation  AKI Present on admission.  Creatinine of 1.59 on admission.  Resolved with IV fluids.  Acute on chronic thrombocytopenia Oncology on board.  Recommendation is for platelet transfusion for platelets less than 20,000 -Transfuse 2 units of platelets for platelet count of 13,000 -CBC in a.m.  Possible pneumonia Patient with patchy opacity in left base with elevated temperature without true fever.  Patient started on empiric ceftriaxone and doxycycline with plan for 5-day treatment course. -Continue ceftriaxone and doxycycline  Metastatic prostate cancer Bone metastasis Patient follows with medical oncology Dr. Al Pimple.  Patient is currently on Zytiga which has been restarted this admission.  Primary hypertension -Continue atenolol  Hyperlipidemia -Continue Lipitor  Dehydration Managed with IV fluids.  Oral thrush -Continue nystatin  Hypokalemia Resolved with supplementation.  DVT prophylaxis: SCDs Code Status:   Code Status: DNR Family Communication: Daughter via telephone Disposition Plan: Discharge to SNF not likely before 2 days   Consultants:  Medical oncology Gastroenterology  Procedures:    Antimicrobials:     Subjective: Patient reports  no issues this morning, although had an episode of emesis.  He reports that he thinks the Ensure did not agree with him today.  Per nursing, concern patient may be ate too much this morning causing the emesis.  No other concerns from overnight.  Objective: BP (!) 169/83 (BP Location: Left Arm)   Pulse 61   Temp (!) 97.4 F (36.3 C) (Axillary)   Resp 20   Ht 5\' 4"  (1.626 m)   Wt 59 kg   SpO2 98%   BMI 22.31 kg/m   Examination:  General exam: Appears calm and comfortable Respiratory system: Clear to auscultation. Respiratory effort normal. Cardiovascular system: S1 & S2 heard, RRR.  2 out of 6 systolic murmur. Gastrointestinal system: Abdomen is nondistended, soft and nontender. Normal bowel sounds heard. Central nervous system: Alert. No focal neurological deficits.   Data Reviewed: I have personally reviewed following labs and imaging studies  CBC Lab Results  Component Value Date   WBC 5.7 08/24/2022   RBC 2.89 (L) 08/24/2022   HGB 8.6 (L) 08/24/2022   HCT 26.7 (L) 08/24/2022   MCV 92.4 08/24/2022   MCH 29.8 08/24/2022   PLT 25 (LL) 08/24/2022   MCHC 32.2 08/24/2022   RDW 17.6 (H) 08/24/2022   LYMPHSABS 1.3 08/19/2022   MONOABS 0.6 08/19/2022   EOSABS 0.1 08/19/2022   BASOSABS 0.0 08/19/2022     Last metabolic panel Lab Results  Component Value Date   NA 137 08/24/2022   K 3.5 08/24/2022   CL 107 08/24/2022   CO2 21 (L) 08/24/2022   BUN 22 08/24/2022   CREATININE 0.84 08/24/2022   GLUCOSE 96 08/24/2022   GFRNONAA >60 08/24/2022   GFRAA 71 (L) 06/16/2011   CALCIUM 7.8 (L) 08/24/2022   PROT 5.6 (L) 08/24/2022  ALBUMIN 2.7 (L) 08/24/2022   BILITOT 1.4 (H) 08/24/2022   ALKPHOS 59 08/24/2022   AST 77 (H) 08/24/2022   ALT 46 (H) 08/24/2022   ANIONGAP 9 08/24/2022    GFR: Estimated Creatinine Clearance: 42.9 mL/min (by C-G formula based on SCr of 0.84 mg/dL).  Recent Results (from the past 240 hour(s))  Resp panel by RT-PCR (RSV, Flu A&B, Covid)  Anterior Nasal Swab     Status: None   Collection Time: 08/17/22  7:14 PM   Specimen: Anterior Nasal Swab  Result Value Ref Range Status   SARS Coronavirus 2 by RT PCR NEGATIVE NEGATIVE Final    Comment: (NOTE) SARS-CoV-2 target nucleic acids are NOT DETECTED.  The SARS-CoV-2 RNA is generally detectable in upper respiratory specimens during the acute phase of infection. The lowest concentration of SARS-CoV-2 viral copies this assay can detect is 138 copies/mL. A negative result does not preclude SARS-Cov-2 infection and should not be used as the sole basis for treatment or other patient management decisions. A negative result may occur with  improper specimen collection/handling, submission of specimen other than nasopharyngeal swab, presence of viral mutation(s) within the areas targeted by this assay, and inadequate number of viral copies(<138 copies/mL). A negative result must be combined with clinical observations, patient history, and epidemiological information. The expected result is Negative.  Fact Sheet for Patients:  BloggerCourse.com  Fact Sheet for Healthcare Providers:  SeriousBroker.it  This test is no t yet approved or cleared by the [place] FDA and  has been authorized for detection and/or diagnosis of SARS-CoV-2 by FDA under an Emergency Use Authorization (EUA). This EUA will remain  in effect (meaning this test can be used) for the duration of the COVID-19 declaration under Section 564(b)(1) of the Act, 21 U.S.C.section 360bbb-3(b)(1), unless the authorization is terminated  or revoked sooner.       Influenza A by PCR NEGATIVE NEGATIVE Final   Influenza B by PCR NEGATIVE NEGATIVE Final    Comment: (NOTE) The Xpert Xpress SARS-CoV-2/FLU/RSV plus assay is intended as an aid in the diagnosis of influenza from Nasopharyngeal swab specimens and should not be used as a sole basis for treatment. Nasal washings  and aspirates are unacceptable for Xpert Xpress SARS-CoV-2/FLU/RSV testing.  Fact Sheet for Patients: BloggerCourse.com  Fact Sheet for Healthcare Providers: SeriousBroker.it  This test is not yet approved or cleared by the [place] FDA and has been authorized for detection and/or diagnosis of SARS-CoV-2 by FDA under an Emergency Use Authorization (EUA). This EUA will remain in effect (meaning this test can be used) for the duration of the COVID-19 declaration under Section 564(b)(1) of the Act, 21 U.S.C. section 360bbb-3(b)(1), unless the authorization is terminated or revoked.     Resp Syncytial Virus by PCR NEGATIVE NEGATIVE Final    Comment: (NOTE) Fact Sheet for Patients: BloggerCourse.com  Fact Sheet for Healthcare Providers: SeriousBroker.it  This test is not yet approved or cleared by the [place] FDA and has been authorized for detection and/or diagnosis of SARS-CoV-2 by FDA under an Emergency Use Authorization (EUA). This EUA will remain in effect (meaning this test can be used) for the duration of the COVID-19 declaration under Section 564(b)(1) of the Act, 21 U.S.C. section 360bbb-3(b)(1), unless the authorization is terminated or revoked.  Performed at [facility], [address] [place], [place] [address]       Radiology Studies: No results found.    LOS: 7 days    Jacquelin Hawking,  MD [facility] 08/25/2022, 6:17 PM   If 7PM-7AM, please contact night-coverage [email or web address]

## 2022-08-26 ENCOUNTER — Other Ambulatory Visit: Payer: Self-pay

## 2022-08-26 DIAGNOSIS — D649 Anemia, unspecified: Secondary | ICD-10-CM | POA: Diagnosis not present

## 2022-08-26 LAB — TYPE AND SCREEN
ABO/RH(D): A NEG
Antibody Screen: NEGATIVE
Unit division: 0

## 2022-08-26 LAB — CBC
HCT: 24.9 % — ABNORMAL LOW (ref 39.0–52.0)
Hemoglobin: 8.1 g/dL — ABNORMAL LOW (ref 13.0–17.0)
MCH: 29.9 pg (ref 26.0–34.0)
MCHC: 32.5 g/dL (ref 30.0–36.0)
MCV: 91.9 fL (ref 80.0–100.0)
Platelets: 91 10*3/uL — ABNORMAL LOW (ref 150–400)
RBC: 2.71 MIL/uL — ABNORMAL LOW (ref 4.22–5.81)
RDW: 16.8 % — ABNORMAL HIGH (ref 11.5–15.5)
WBC: 6.1 10*3/uL (ref 4.0–10.5)
nRBC: 22.8 % — ABNORMAL HIGH (ref 0.0–0.2)

## 2022-08-26 LAB — BPAM RBC
Blood Product Expiration Date: 202408252359
ISSUE DATE / TIME: 202408011351
Unit Type and Rh: 600

## 2022-08-26 LAB — PREPARE RBC (CROSSMATCH)

## 2022-08-26 MED ORDER — SODIUM CHLORIDE 0.9% IV SOLUTION: Freq: Once | INTRAVENOUS | Status: AC

## 2022-08-26 MED ORDER — POLYVINYL ALCOHOL 1.4 % OP SOLN
1.0000 [drp] | Freq: Two times a day (BID) | OPHTHALMIC | Status: DC
  Administered 2022-08-26 – 2022-09-03 (×16): 1 [drp] via OPHTHALMIC
  Filled 2022-08-26 (×2): qty 15

## 2022-08-26 MED ORDER — POLYETHYLENE GLYCOL 3350 17 G PO PACK
17.0000 g | PACK | Freq: Every day | ORAL | Status: DC
  Administered 2022-08-26 – 2022-08-29 (×4): 17 g via ORAL
  Filled 2022-08-26 (×5): qty 1

## 2022-08-26 NOTE — Plan of Care (Signed)

## 2022-08-26 NOTE — Progress Notes (Signed)
PROGRESS NOTE    Craig Freeman  ZOX:[id number] DOB: [birth year] DOA: 08/17/2022 PCP: Georgann Housekeeper, MD   Brief Narrative: Craig Freeman is a 89 y.o. male with a history of metastatic prostate cancer to bone, chronic anemia, chronic thrombocytopenia, hypertension, hyperlipidemia, DVT.  Patient presented secondary to weakness and fatigue and was found to have evidence of symptomatic anemia requiring multiple blood transfusions.   Assessment and Plan:  Acute on chronic anemia Symptomatic anemia Concern for multifactorial etiology related to slow GI bleeding and metastatic disease causing bone marrow replacement.  Patient is currently not on anticoagulation.  Patient with fecal occult positive blood test.  Patient has required a total of 2 units of PRBC via blood transfusion this admission.  GI was consulted and recommended no plan for intervention, including no endoscopy procedure.  Hemoglobin of 7.1 this morning. -Continue conservative management -Continue folic acid -Transfuse 1 unit of PRBC for hemoglobin of 7.1; post-transfusion CBC -Transfuse to keep hemoglobin greater than 8 g/dL per oncology recommendation  AKI Present on admission.  Creatinine of 1.59 on admission.  Resolved with IV fluids.  Acute on chronic thrombocytopenia Oncology on board.  Recommendation is for platelet transfusion for platelets less than 20,000. Patient transfused a total of 3 units of platelets this admission. Platelets up to 135,000 but unsure if this is accurate. -Daily CBC  Possible pneumonia Patient with patchy opacity in left base with elevated temperature without true fever.  Patient started on empiric ceftriaxone and doxycycline with plan for 5-day treatment course. -Continue ceftriaxone and doxycycline  Metastatic prostate cancer Bone metastasis Patient follows with medical oncology Dr. Al Pimple.  Patient is currently on Zytiga which has been restarted this admission.  Primary  hypertension -Continue atenolol  Hyperlipidemia -Continue Lipitor  Dehydration Managed with IV fluids.  Oral thrush -Continue nystatin  Hypokalemia Resolved with supplementation.  DVT prophylaxis: SCDs Code Status:   Code Status: DNR Family Communication: Daughter via telephone Disposition Plan: Discharge to SNF not likely before 1-2 days   Consultants:  Medical oncology Gastroenterology  Procedures:    Antimicrobials:     Subjective: No concerns this morning. Doing well.  Objective: BP (!) 132/59   Pulse 70   Temp 98.2 F (36.8 C) (Oral)   Resp 16   Ht 5\' 4"  (1.626 m)   Wt 59 kg   SpO2 100%   BMI 22.31 kg/m   Examination:  General exam: Appears calm and comfortable Respiratory system: Clear to auscultation. Respiratory effort normal. Cardiovascular system: S1 & S2 heard, RRR. 2/6 systolic murmur Gastrointestinal system: Abdomen is nondistended, soft and nontender. Normal bowel sounds heard. Central nervous system: Alert   Data Reviewed: I have personally reviewed following labs and imaging studies  CBC Lab Results  Component Value Date   WBC 7.3 08/26/2022   RBC 2.35 (L) 08/26/2022   HGB 7.1 (L) 08/26/2022   HCT 22.6 (L) 08/26/2022   MCV 96.2 08/26/2022   MCH 30.2 08/26/2022   PLT 135 (L) 08/26/2022   MCHC 31.4 08/26/2022   RDW 17.9 (H) 08/26/2022   LYMPHSABS 1.3 08/19/2022   MONOABS 0.6 08/19/2022   EOSABS 0.1 08/19/2022   BASOSABS 0.0 08/19/2022     Last metabolic panel Lab Results  Component Value Date   NA 140 08/26/2022   K 3.1 (L) 08/26/2022   CL 106 08/26/2022   CO2 22 08/26/2022   BUN 21 08/26/2022   CREATININE 0.91 08/26/2022   GLUCOSE 91 08/26/2022   GFRNONAA >60 08/26/2022  GFRAA 71 (L) 06/16/2011   CALCIUM 8.1 (L) 08/26/2022   PROT 5.9 (L) 08/26/2022   ALBUMIN 2.9 (L) 08/26/2022   BILITOT 1.2 08/26/2022   ALKPHOS 62 08/26/2022   AST 74 (H) 08/26/2022   ALT 44 08/26/2022   ANIONGAP 12 08/26/2022     GFR: Estimated Creatinine Clearance: 39.6 mL/min (by C-G formula based on SCr of 0.91 mg/dL).  Recent Results (from the past 240 hour(s))  Resp panel by RT-PCR (RSV, Flu A&B, Covid) Anterior Nasal Swab     Status: None   Collection Time: 08/17/22  7:14 PM   Specimen: Anterior Nasal Swab  Result Value Ref Range Status   SARS Coronavirus 2 by RT PCR NEGATIVE NEGATIVE Final    Comment: (NOTE) SARS-CoV-2 target nucleic acids are NOT DETECTED.  The SARS-CoV-2 RNA is generally detectable in upper respiratory specimens during the acute phase of infection. The lowest concentration of SARS-CoV-2 viral copies this assay can detect is 138 copies/mL. A negative result does not preclude SARS-Cov-2 infection and should not be used as the sole basis for treatment or other patient management decisions. A negative result may occur with  improper specimen collection/handling, submission of specimen other than nasopharyngeal swab, presence of viral mutation(s) within the areas targeted by this assay, and inadequate number of viral copies(<138 copies/mL). A negative result must be combined with clinical observations, patient history, and epidemiological information. The expected result is Negative.  Fact Sheet for Patients:  BloggerCourse.com  Fact Sheet for Healthcare Providers:  SeriousBroker.it  This test is no t yet approved or cleared by the [place] FDA and  has been authorized for detection and/or diagnosis of SARS-CoV-2 by FDA under an Emergency Use Authorization (EUA). This EUA will remain  in effect (meaning this test can be used) for the duration of the COVID-19 declaration under Section 564(b)(1) of the Act, 21 U.S.C.section 360bbb-3(b)(1), unless the authorization is terminated  or revoked sooner.       Influenza A by PCR NEGATIVE NEGATIVE Final   Influenza B by PCR NEGATIVE NEGATIVE Final    Comment: (NOTE) The Xpert  Xpress SARS-CoV-2/FLU/RSV plus assay is intended as an aid in the diagnosis of influenza from Nasopharyngeal swab specimens and should not be used as a sole basis for treatment. Nasal washings and aspirates are unacceptable for Xpert Xpress SARS-CoV-2/FLU/RSV testing.  Fact Sheet for Patients: BloggerCourse.com  Fact Sheet for Healthcare Providers: SeriousBroker.it  This test is not yet approved or cleared by the [place] FDA and has been authorized for detection and/or diagnosis of SARS-CoV-2 by FDA under an Emergency Use Authorization (EUA). This EUA will remain in effect (meaning this test can be used) for the duration of the COVID-19 declaration under Section 564(b)(1) of the Act, 21 U.S.C. section 360bbb-3(b)(1), unless the authorization is terminated or revoked.     Resp Syncytial Virus by PCR NEGATIVE NEGATIVE Final    Comment: (NOTE) Fact Sheet for Patients: BloggerCourse.com  Fact Sheet for Healthcare Providers: SeriousBroker.it  This test is not yet approved or cleared by the [place] FDA and has been authorized for detection and/or diagnosis of SARS-CoV-2 by FDA under an Emergency Use Authorization (EUA). This EUA will remain in effect (meaning this test can be used) for the duration of the COVID-19 declaration under Section 564(b)(1) of the Act, 21 U.S.C. section 360bbb-3(b)(1), unless the authorization is terminated or revoked.  Performed at [facility], [address] [place], [place] [address]  Radiology Studies: No results found.    LOS: 8 days    Jacquelin Hawking, MD [facility] 08/26/2022, 4:12 PM   If 7PM-7AM, please contact night-coverage [email or web address]

## 2022-08-26 DEATH — deceased

## 2022-08-27 ENCOUNTER — Other Ambulatory Visit: Payer: Self-pay

## 2022-08-27 DIAGNOSIS — D649 Anemia, unspecified: Secondary | ICD-10-CM | POA: Diagnosis not present

## 2022-08-27 NOTE — Care Management Important Message (Signed)
Important Message  Patient Details IM Letter given Name: Craig Freeman MRN: [id number] Date of Birth: 1925-01-03   Medicare Important Message Given:  Yes     [name] 08/27/2022, 2:55 PM

## 2022-08-27 NOTE — Progress Notes (Signed)
SLP Cancellation Note  Patient Details Name: Craig Freeman MRN: [id number] DOB: 1924/06/07   Cancelled treatment:       Reason Eval/Treat Not Completed: Other (comment) (Patient has been having bouts of agitation therefore deferred follow-up for now, note palliative care will reengage with patient and family per MD notes)   Chales Abrahams 08/27/2022, 6:51 PM Rolena Infante, MS [facility] SLP Acute Rehab Services Office [phone]

## 2022-08-27 NOTE — Progress Notes (Signed)
Craig Freeman   DOB:[birth year]   BJ#:[id number]   HYW#:[id number]  Hem/onc follow up   Subjective: Pt is clinically stable, last plt transfusion was 2 days ago, no bleeding.  He is still very fatigued, able to sit in the chair, daughter at the bedside.   Objective:  Vitals:   08/27/22 0951 08/27/22 1401  BP: (!) 142/58 (!) 171/64  Pulse: 65 65  Resp:  18  Temp:  98.3 F (36.8 C)  SpO2:  95%    Body mass index is 22.31 kg/m.  Intake/Output Summary (Last 24 hours) at 08/27/2022 1459 Last data filed at 08/27/2022 1000 Gross per 24 hour  Intake 4013.91 ml  Output 700 ml  Net 3313.91 ml     Sclerae unicteric  Oropharynx clear  No peripheral adenopathy  Lungs clear -- no rales or rhonchi  Heart regular rate and rhythm  Abdomen benign  MSK no focal spinal tenderness, no peripheral edema  Neuro nonfocal    CBG (last 3)  No results for input(s): "GLUCAP" in the last 72 hours.    Labs:   Urine Studies No results for input(s): "UHGB", "CRYS" in the last 72 hours.  Invalid input(s): "UACOL", "UAPR", "USPG", "UPH", "UTP", "UGL", "UKET", "UBIL", "UNIT", "UROB", "ULEU", "UEPI", "UWBC", "URBC", "UBAC", "CAST", "UCOM", "BILUA"  Basic Metabolic Panel: Recent Labs  Lab 08/21/22 0815 08/22/22 0721 08/23/22 0559 08/24/22 0558 08/26/22 0538  NA 141 140 140 137 140  K 2.9* 3.5 3.1* 3.5 3.1*  CL 106 107 107 107 106  CO2 25 23 21* 21* 22  GLUCOSE 92 83 89 96 91  BUN 35* 31* 24* 22 21  CREATININE 1.08 0.97 0.80 0.84 0.91  CALCIUM 8.1* 7.9* 7.8* 7.8* 8.1*   GFR Estimated Creatinine Clearance: 39.6 mL/min (by C-G formula based on SCr of 0.91 mg/dL). Liver Function Tests: Recent Labs  Lab 08/21/22 0815 08/22/22 0721 08/23/22 0559 08/24/22 0558 08/26/22 0538  AST 65* 61* 62* 77* 74*  ALT 33 35 36 46* 44  ALKPHOS 48 50 52 59 62  BILITOT 1.1 1.3* 1.0 1.4* 1.2  PROT 5.3* 5.4* 5.4* 5.6* 5.9*  ALBUMIN 2.6* 2.6* 2.6* 2.7* 2.9*   No results for input(s): "LIPASE", "AMYLASE"  in the last 168 hours. No results for input(s): "AMMONIA" in the last 168 hours. Coagulation profile No results for input(s): "INR", "PROTIME" in the last 168 hours.  CBC: Recent Labs  Lab 08/24/22 0558 08/25/22 1839 08/26/22 0538 08/26/22 1915 08/27/22 0528  WBC 5.7 6.0 7.3 6.1 6.9  HGB 8.6* 8.0* 7.1* 8.1* 8.7*  HCT 26.7* 24.6* 22.6* 24.9* 27.1*  MCV 92.4 93.2 96.2 91.9 93.8  PLT 25* 13* 135* 91* 66*   Cardiac Enzymes: No results for input(s): "CKTOTAL", "CKMB", "CKMBINDEX", "TROPONINI" in the last 168 hours. BNP: Invalid input(s): "POCBNP" CBG: Recent Labs  Lab 08/21/22 2100  GLUCAP 104*   D-Dimer No results for input(s): "DDIMER" in the last 72 hours. Hgb A1c No results for input(s): "HGBA1C" in the last 72 hours. Lipid Profile No results for input(s): "CHOL", "HDL", "LDLCALC", "TRIG", "CHOLHDL", "LDLDIRECT" in the last 72 hours. Thyroid function studies No results for input(s): "TSH", "T4TOTAL", "T3FREE", "THYROIDAB" in the last 72 hours.  Invalid input(s): "FREET3" Anemia work up No results for input(s): "VITAMINB12", "FOLATE", "FERRITIN", "TIBC", "IRON", "RETICCTPCT" in the last 72 hours. Microbiology Recent Results (from the past 240 hour(s))  Resp panel by RT-PCR (RSV, Flu A&B, Covid) Anterior Nasal Swab     Status: None  Collection Time: 08/17/22  7:14 PM   Specimen: Anterior Nasal Swab  Result Value Ref Range Status   SARS Coronavirus 2 by RT PCR NEGATIVE NEGATIVE Final    Comment: (NOTE) SARS-CoV-2 target nucleic acids are NOT DETECTED.  The SARS-CoV-2 RNA is generally detectable in upper respiratory specimens during the acute phase of infection. The lowest concentration of SARS-CoV-2 viral copies this assay can detect is 138 copies/mL. A negative result does not preclude SARS-Cov-2 infection and should not be used as the sole basis for treatment or other patient management decisions. A negative result may occur with  improper specimen  collection/handling, submission of specimen other than nasopharyngeal swab, presence of viral mutation(s) within the areas targeted by this assay, and inadequate number of viral copies(<138 copies/mL). A negative result must be combined with clinical observations, patient history, and epidemiological information. The expected result is Negative.  Fact Sheet for Patients:  BloggerCourse.com  Fact Sheet for Healthcare Providers:  SeriousBroker.it  This test is no t yet approved or cleared by the [place] FDA and  has been authorized for detection and/or diagnosis of SARS-CoV-2 by FDA under an Emergency Use Authorization (EUA). This EUA will remain  in effect (meaning this test can be used) for the duration of the COVID-19 declaration under Section 564(b)(1) of the Act, 21 U.S.C.section 360bbb-3(b)(1), unless the authorization is terminated  or revoked sooner.       Influenza A by PCR NEGATIVE NEGATIVE Final   Influenza B by PCR NEGATIVE NEGATIVE Final    Comment: (NOTE) The Xpert Xpress SARS-CoV-2/FLU/RSV plus assay is intended as an aid in the diagnosis of influenza from Nasopharyngeal swab specimens and should not be used as a sole basis for treatment. Nasal washings and aspirates are unacceptable for Xpert Xpress SARS-CoV-2/FLU/RSV testing.  Fact Sheet for Patients: BloggerCourse.com  Fact Sheet for Healthcare Providers: SeriousBroker.it  This test is not yet approved or cleared by the [place] FDA and has been authorized for detection and/or diagnosis of SARS-CoV-2 by FDA under an Emergency Use Authorization (EUA). This EUA will remain in effect (meaning this test can be used) for the duration of the COVID-19 declaration under Section 564(b)(1) of the Act, 21 U.S.C. section 360bbb-3(b)(1), unless the authorization is terminated or revoked.     Resp Syncytial  Virus by PCR NEGATIVE NEGATIVE Final    Comment: (NOTE) Fact Sheet for Patients: BloggerCourse.com  Fact Sheet for Healthcare Providers: SeriousBroker.it  This test is not yet approved or cleared by the [place] FDA and has been authorized for detection and/or diagnosis of SARS-CoV-2 by FDA under an Emergency Use Authorization (EUA). This EUA will remain in effect (meaning this test can be used) for the duration of the COVID-19 declaration under Section 564(b)(1) of the Act, 21 U.S.C. section 360bbb-3(b)(1), unless the authorization is terminated or revoked.  Performed at [facility], [address], [place], [place] [address]       Studies:  No results found.  Assessment: 89 y.o. male   Symptomatic acute on chronic anemia, secondary to bone metastasis from prostate cancer, and GI bleeding.  He also has a mildly low folate, on folic acid now. AKI, resolved Chronic severe thrombocytopenia, secondary to bone metastasis Possible pneumonia Hypertension Generalized weakness and deconditioning    Plan:  -continue blood transfusion to keep Hg>8.5 and plt>20K --I spoke with patient and his daughter, they are not ready for hospice -Continue Zytiga at the low-dose to 50 mg twice daily, and prednisone 5  mg daily.  -Unfortunately patient has no additional treatment options, his prognosis is very poor, he has been on Zytiga for months, and no significant clinical response.  He is hospice appropriate. -He has agreed with DNR -If he goes to rehab, will continue monitoring his blood counts, and give blood transfusion as needed.  He has appointment with [place] on August 31, 2022, will likely postpone since he is still in hospital.  -answered her daughter's questions.   Malachy Mood, MD 08/27/2022

## 2022-08-27 NOTE — Progress Notes (Addendum)
PROGRESS NOTE    [name]  ZOX:[id number] DOB: 03-Jul-1924 DOA: 08/17/2022 PCP: Georgann Housekeeper, MD   Brief Narrative: Craig Freeman is a 89 y.o. male with a history of metastatic prostate cancer to bone, chronic anemia, chronic thrombocytopenia, hypertension, hyperlipidemia, DVT.  Patient presented secondary to weakness and fatigue and was found to have evidence of symptomatic anemia requiring multiple blood transfusions.   Assessment and Plan:  Acute on chronic anemia Symptomatic anemia Concern for multifactorial etiology related to slow GI bleeding and metastatic disease causing bone marrow replacement.  Patient is currently not on anticoagulation.  Patient with fecal occult positive blood test.  Patient has required a total of 3 units of PRBC via blood transfusion this admission.  GI was consulted and recommended no plan for intervention, including no endoscopy procedure.  -Continue conservative management -Continue folic acid -Transfuse to keep hemoglobin greater than 8 g/dL per oncology recommendation  AKI Present on admission.  Creatinine of 1.59 on admission.  Resolved with IV fluids.  Acute on chronic thrombocytopenia Oncology on board.  Recommendation is for platelet transfusion for platelets less than 20,000. Patient transfused a total of 3 units of platelets this admission. Platelets up to 135,000 and downtrending again. -Daily CBC  Possible pneumonia Patient with patchy opacity in left base with elevated temperature without true fever.  Patient started on empiric ceftriaxone and doxycycline and completed a 5-day treatment course.  Metastatic prostate cancer Bone metastasis Patient follows with medical oncology Dr. Al Pimple.  Patient is currently on Zytiga which has been restarted this admission. Discussed with patient and she is interested in having discussions with her father with palliative care present. -Will ask palliative care to have further goals of care  discussions with patient/daughter  Primary hypertension -Continue atenolol  Hyperlipidemia -Continue Lipitor  Dehydration Managed with IV fluids.  Oral thrush -Continue nystatin  Hypokalemia Resolved with supplementation.  DVT prophylaxis: SCDs Code Status:   Code Status: DNR Family Communication: Daughter via telephone (8/2) Disposition Plan: Discharge to SNF not likely before 1-2 days   Consultants:  Medical oncology Gastroenterology  Procedures:    Antimicrobials:     Subjective: No issues per patient. Per daughter, oncology discussed recommending she speak with her father regarding goals of care. Daughter is unsure if she can have that conversation.  Objective: BP (!) 142/58 (BP Location: Left Arm)   Pulse 65   Temp 98.4 F (36.9 C) (Oral)   Resp 15   Ht 5\' 4"  (1.626 m)   Wt 59 kg   SpO2 93%   BMI 22.31 kg/m   Examination:  General exam: Appears calm and comfortable Respiratory system: Clear to auscultation. Respiratory effort normal. Cardiovascular system: S1 & S2 heard, RRR. Systolic murmur Gastrointestinal system: Abdomen is nondistended, soft and nontender. No organomegaly or masses felt. Normal bowel sounds heard. Central nervous system: Alert  Data Reviewed: I have personally reviewed following labs and imaging studies  CBC Lab Results  Component Value Date   WBC 6.9 08/27/2022   RBC 2.89 (L) 08/27/2022   HGB 8.7 (L) 08/27/2022   HCT 27.1 (L) 08/27/2022   MCV 93.8 08/27/2022   MCH 30.1 08/27/2022   PLT 66 (L) 08/27/2022   MCHC 32.1 08/27/2022   RDW 17.3 (H) 08/27/2022   LYMPHSABS 1.3 08/19/2022   MONOABS 0.6 08/19/2022   EOSABS 0.1 08/19/2022   BASOSABS 0.0 08/19/2022     Last metabolic panel Lab Results  Component Value Date   NA 140 08/26/2022  K 3.1 (L) 08/26/2022   CL 106 08/26/2022   CO2 22 08/26/2022   BUN 21 08/26/2022   CREATININE 0.91 08/26/2022   GLUCOSE 91 08/26/2022   GFRNONAA >60 08/26/2022   GFRAA 71 (L)  06/16/2011   CALCIUM 8.1 (L) 08/26/2022   PROT 5.9 (L) 08/26/2022   ALBUMIN 2.9 (L) 08/26/2022   BILITOT 1.2 08/26/2022   ALKPHOS 62 08/26/2022   AST 74 (H) 08/26/2022   ALT 44 08/26/2022   ANIONGAP 12 08/26/2022    GFR: Estimated Creatinine Clearance: 39.6 mL/min (by C-G formula based on SCr of 0.91 mg/dL).  Recent Results (from the past 240 hour(s))  Resp panel by RT-PCR (RSV, Flu A&B, Covid) Anterior Nasal Swab     Status: None   Collection Time: 08/17/22  7:14 PM   Specimen: Anterior Nasal Swab  Result Value Ref Range Status   SARS Coronavirus 2 by RT PCR NEGATIVE NEGATIVE Final    Comment: (NOTE) SARS-CoV-2 target nucleic acids are NOT DETECTED.  The SARS-CoV-2 RNA is generally detectable in upper respiratory specimens during the acute phase of infection. The lowest concentration of SARS-CoV-2 viral copies this assay can detect is 138 copies/mL. A negative result does not preclude SARS-Cov-2 infection and should not be used as the sole basis for treatment or other patient management decisions. A negative result may occur with  improper specimen collection/handling, submission of specimen other than nasopharyngeal swab, presence of viral mutation(s) within the areas targeted by this assay, and inadequate number of viral copies(<138 copies/mL). A negative result must be combined with clinical observations, patient history, and epidemiological information. The expected result is Negative.  Fact Sheet for Patients:  BloggerCourse.com  Fact Sheet for Healthcare Providers:  SeriousBroker.it  This test is no t yet approved or cleared by the [place] FDA and  has been authorized for detection and/or diagnosis of SARS-CoV-2 by FDA under an Emergency Use Authorization (EUA). This EUA will remain  in effect (meaning this test can be used) for the duration of the COVID-19 declaration under Section 564(b)(1) of the Act,  21 U.S.C.section 360bbb-3(b)(1), unless the authorization is terminated  or revoked sooner.       Influenza A by PCR NEGATIVE NEGATIVE Final   Influenza B by PCR NEGATIVE NEGATIVE Final    Comment: (NOTE) The Xpert Xpress SARS-CoV-2/FLU/RSV plus assay is intended as an aid in the diagnosis of influenza from Nasopharyngeal swab specimens and should not be used as a sole basis for treatment. Nasal washings and aspirates are unacceptable for Xpert Xpress SARS-CoV-2/FLU/RSV testing.  Fact Sheet for Patients: BloggerCourse.com  Fact Sheet for Healthcare Providers: SeriousBroker.it  This test is not yet approved or cleared by the [place] FDA and has been authorized for detection and/or diagnosis of SARS-CoV-2 by FDA under an Emergency Use Authorization (EUA). This EUA will remain in effect (meaning this test can be used) for the duration of the COVID-19 declaration under Section 564(b)(1) of the Act, 21 U.S.C. section 360bbb-3(b)(1), unless the authorization is terminated or revoked.     Resp Syncytial Virus by PCR NEGATIVE NEGATIVE Final    Comment: (NOTE) Fact Sheet for Patients: BloggerCourse.com  Fact Sheet for Healthcare Providers: SeriousBroker.it  This test is not yet approved or cleared by the [place] FDA and has been authorized for detection and/or diagnosis of SARS-CoV-2 by FDA under an Emergency Use Authorization (EUA). This EUA will remain in effect (meaning this test can be used) for the duration of the COVID-19 declaration under  Section 564(b)(1) of the Act, 21 U.S.C. section 360bbb-3(b)(1), unless the authorization is terminated or revoked.  Performed at [facility], [address] [place], [place] [address]       Radiology Studies: No results found.    LOS: 9 days    Jacquelin Hawking, MD [facility] 08/27/2022, 1:52  PM   If 7PM-7AM, please contact night-coverage [email or web address]

## 2022-08-27 NOTE — Progress Notes (Signed)
PT Cancellation Note  Patient Details Name: RYLE BUSCEMI MRN: [id number] DOB: 13-Jul-1924   Cancelled Treatment:    Reason Eval/Treat Not Completed: Patient declined, no reason specified  Made several attempts to mobilize  patient, became agitated so did not attempt OOB. Blanchard Kelch PT Acute Rehabilitation Services Office [phone] Weekend [phone]  Rada Hay 08/27/2022, 11:32 AM

## 2022-08-28 DIAGNOSIS — C61 Malignant neoplasm of prostate: Secondary | ICD-10-CM

## 2022-08-28 DIAGNOSIS — D696 Thrombocytopenia, unspecified: Secondary | ICD-10-CM | POA: Diagnosis not present

## 2022-08-28 DIAGNOSIS — Z515 Encounter for palliative care: Secondary | ICD-10-CM | POA: Diagnosis not present

## 2022-08-28 DIAGNOSIS — Z66 Do not resuscitate: Secondary | ICD-10-CM

## 2022-08-28 DIAGNOSIS — D649 Anemia, unspecified: Secondary | ICD-10-CM | POA: Diagnosis not present

## 2022-08-28 DIAGNOSIS — C7951 Secondary malignant neoplasm of bone: Secondary | ICD-10-CM

## 2022-08-28 DIAGNOSIS — R4589 Other symptoms and signs involving emotional state: Secondary | ICD-10-CM

## 2022-08-28 DIAGNOSIS — Z7189 Other specified counseling: Secondary | ICD-10-CM

## 2022-08-28 LAB — PREPARE RBC (CROSSMATCH)

## 2022-08-28 MED ORDER — POTASSIUM CHLORIDE CRYS ER 20 MEQ PO TBCR
40.0000 meq | EXTENDED_RELEASE_TABLET | ORAL | Status: AC
  Administered 2022-08-28 (×2): 40 meq via ORAL
  Filled 2022-08-28 (×2): qty 2

## 2022-08-28 MED ORDER — HYDRALAZINE HCL 20 MG/ML IJ SOLN
5.0000 mg | Freq: Once | INTRAMUSCULAR | Status: AC
  Administered 2022-08-28: 5 mg via INTRAVENOUS
  Filled 2022-08-28: qty 1

## 2022-08-28 MED ORDER — SODIUM CHLORIDE 0.9% IV SOLUTION: Freq: Once | INTRAVENOUS | Status: AC

## 2022-08-28 MED ORDER — LOSARTAN POTASSIUM 50 MG PO TABS
25.0000 mg | ORAL_TABLET | Freq: Every day | ORAL | Status: DC
  Administered 2022-08-28 – 2022-08-29 (×2): 25 mg via ORAL
  Filled 2022-08-28 (×2): qty 1

## 2022-08-28 NOTE — Progress Notes (Addendum)
PROGRESS NOTE    Craig Freeman  ZHY:[id number] DOB: 21-Aug-1924 DOA: 08/17/2022 PCP: Georgann Housekeeper, MD   Brief Narrative: Craig Freeman is a 89 y.o. male with a history of metastatic prostate cancer to bone, chronic anemia, chronic thrombocytopenia, hypertension, hyperlipidemia, DVT.  Patient presented secondary to weakness and fatigue and was found to have evidence of symptomatic anemia requiring multiple blood transfusions.   Assessment and Plan:  Acute on chronic anemia Symptomatic anemia Concern for multifactorial etiology related to slow GI bleeding and metastatic disease causing bone marrow replacement.  Patient is currently not on anticoagulation.  Patient with fecal occult positive blood test.  Patient has required a total of 3 units of PRBC via blood transfusion this admission.  GI was consulted and recommended no plan for intervention, including no endoscopy procedure. Patient continues to have recurrent anemia -Continue conservative management -Continue folic acid -Transfuse to keep hemoglobin greater than 8.5 g/dL per oncology recommendation -Will hold transfusion today for hemoglobin of 8.1 until after goals of care discussions with palliative care  Addendum: Transfusion ordered for hemoglobin of 8.1. Went to bedside to discuss goals of care with patient and daughter. Decision made to transition home with hospice as patient is unable to keep his blood counts stable and is requiring frequent transfusions. Patient does not wish to remain in the hospital indefinitely. Family planning to arrange for patient to discharge home with his son and are interested in hospice services. TOC consult placed for hospice and will work towards this disposition. Will continue previously ordered transfusion. No changes to medications at this time, but will discuss What to continue and what to discontinue.  AKI Present on admission.  Creatinine of 1.59 on admission.  Resolved with IV  fluids.  Acute on chronic thrombocytopenia Oncology on board.  Recommendation is for platelet transfusion for platelets less than 20,000. Patient transfused a total of 3 units of platelets this admission. Platelets up to 135,000 after most recent transfusion and downtrending again. -Daily CBC  Possible pneumonia Patient with patchy opacity in left base with elevated temperature without true fever.  Patient started on empiric ceftriaxone and doxycycline and completed a 5-day treatment course.  Metastatic prostate cancer Bone metastasis Patient follows with medical oncology Dr. Al Pimple.  Patient is currently on Zytiga which has been restarted this admission. Discussed with patient and she is interested in having discussions with her father with palliative care present. -Follow-up palliative care recommendations  Primary hypertension -Continue atenolol -Continue losartan   Hyperlipidemia -Continue Lipitor  Dehydration Managed with IV fluids.  Oral thrush -Continue nystatin  Hypokalemia Resolved with supplementation.  DVT prophylaxis: SCDs Code Status:   Code Status: DNR Family Communication: Daughter via telephone (8/3) Disposition Plan: Discharge to SNF not likely before 2-4 days   Consultants:  Medical oncology Gastroenterology  Procedures:    Antimicrobials:     Subjective: No issues noted from overnight.  Objective: BP (!) 141/66 (BP Location: Left Arm)   Pulse 66   Temp 98 F (36.7 C) (Oral)   Resp 16   Ht 5\' 4"  (1.626 m)   Wt 59 kg   SpO2 94%   BMI 22.31 kg/m   Examination:  General exam: Appears calm and comfortable Respiratory system: Respiratory effort normal.    Data Reviewed: I have personally reviewed following labs and imaging studies  CBC Lab Results  Component Value Date   WBC 7.0 08/28/2022   RBC 2.66 (L) 08/28/2022   HGB 8.1 (L) 08/28/2022  HCT 24.3 (L) 08/28/2022   MCV 91.4 08/28/2022   MCH 30.5 08/28/2022   PLT 33 (L)  08/28/2022   MCHC 33.3 08/28/2022   RDW 17.1 (H) 08/28/2022   LYMPHSABS 1.3 08/19/2022   MONOABS 0.6 08/19/2022   EOSABS 0.1 08/19/2022   BASOSABS 0.0 08/19/2022     Last metabolic panel Lab Results  Component Value Date   NA 140 08/26/2022   K 3.1 (L) 08/26/2022   CL 106 08/26/2022   CO2 22 08/26/2022   BUN 21 08/26/2022   CREATININE 0.91 08/26/2022   GLUCOSE 91 08/26/2022   GFRNONAA >60 08/26/2022   GFRAA 71 (L) 06/16/2011   CALCIUM 8.1 (L) 08/26/2022   PROT 5.9 (L) 08/26/2022   ALBUMIN 2.9 (L) 08/26/2022   BILITOT 1.2 08/26/2022   ALKPHOS 62 08/26/2022   AST 74 (H) 08/26/2022   ALT 44 08/26/2022   ANIONGAP 12 08/26/2022    GFR: Estimated Creatinine Clearance: 39.6 mL/min (by C-G formula based on SCr of 0.91 mg/dL).  No results found for this or any previous visit (from the past 240 hour(s)).     Radiology Studies: No results found.    LOS: 10 days    Jacquelin Hawking, MD [facility] 08/28/2022, 12:22 PM   If 7PM-7AM, please contact night-coverage [email or web address]

## 2022-08-28 NOTE — Progress Notes (Signed)
Daily Progress Note   Patient Name: Craig Freeman       Date: 08/28/2022 DOB: 30-Jul-1924  Age: 89 y.o. MRN#: [id number] Attending Physician: Narda Bonds, MD Primary Care Physician: Georgann Housekeeper, MD Admit Date: 08/17/2022 Length of Stay: 10 days  Reason for Consultation/Follow-up: Establishing goals of care  Subjective:   CC: Patient sitting up in chair at bedside, appears uncomfortable.  Following up with patient and daughter regarding complex medical decision making.  Subjective:  Reviewed EMR prior to presenting to bedside.  Seen by oncologist on 8/2 noting that patient does not have any additional treatment options for his metastatic prostate cancer which is causing acute on chronic anemia secondary to bone metastasis for which she has a very poor prognosis.  Patient is infusion dependent at this point.  Discussed care with hospitalist as well for medical updates.  Presented to bedside in the afternoon once informed patient's daughter, [name], was present.  With permission, introduced myself to patient and his daughter at bedside.  Patient seen sitting up in chair.  Patient appeared uncomfortable and grimacing at times.  Inquired about pain and patient noted that his back was uncomfortable.  Reviewed EMR noted patient was able to receive as needed Tylenol or oxycodone so discussed this with patient though he refused both.   Then with permission, engaged in conversation regarding patient's medical care.  Able to discuss updates from oncology that patient is not a candidate for any further cancer directed therapies and that his bone marrow is shutting down in the setting of his metastatic prostate cancer.  Patient is infusion dependent at this time because of this.  Tried to explore wishes for medical care moving forward knowing patient's body is shutting down.  Tried multiple times to discuss with patient that he is reaching the end of his life and exploring how would he want to spend  that time.  Patient states that he is not worried about his cancer as this has been going on for a long time.  Patient also stated I am not his primary physician and so he did not want to engage in, patient with me.  Patient also made statements at times that alluded to patient having delirium since he stated he was hospitalized for a crime.  At 1 point patient noted that I could speak with his daughter though he no longer wanted to discuss care with me.  Acknowledged this and tried to speak with daughter about plans for medical care moving forward.  Daughter noted that patient is still able to engage and she would like him to make decisions about his medical care so did not want to discuss this further.  Patient was sliding down in chair at this time anyway so had to get nurses for assistance with moving back to bed.  Excuse myself at that time since patient and daughter not wanting to engage in conversation.  Later informed by RN that patient's daughter wanted to speak with me.  Presented to bedside again.  Daughter willing to speak with this provider outside of the room.  With permission, able to discuss medical care moving forward.  Again discussed that patient is transfusion dependent and that his body is shutting down due to his metastatic cancer burden.  Daughter inquiring about hospice so explained philosophy of hospice.  Discussed home hospice versus long-term care facility with hospice versus inpatient hospice.  Explained the patient would still need 24/7 support from family and friends to remain at home  since hospice is not present there which daughter noted she would not be able to continue doing.  Then discussed that patient could be evaluated for inpatient hospice though you have to be excepted to go there, you cannot just go there because she would like to.  Also explained that inpatient hospice would not continue lab work and not provide transfusions.  Hospice is focused on comfort and symptom  management at the end of life.  Daughter noted she would consider these information and try to discuss with her father if he was willing.  Daughter also admits that patient having episodes of confusion though she is reluctant to make a medical decision on his behalf as his HCPOA since she wants him to make the decision. Spent time providing emotional support via active listening.  Thank daughter for allowing me to meet with her and her father today.  Updated IDT after visit with patient and daughter.  Review of Systems Back pain  Objective:   Vital Signs:  BP (!) 176/65   Pulse (!) 57   Temp 98 F (36.7 C) (Oral)   Resp 16   Ht 5\' 4"  (1.626 m)   Wt 59 kg   SpO2 94%   BMI 22.31 kg/m   Physical Exam: General: grimacing at times, chronically ill appearing, frail, cachectic  Eyes: No drainage noted HENT: Dry mucous membranes Cardiovascular: RRR Respiratory: no increased work of breathing noted, not in respiratory distress Abdomen: not distended Neuro: awake Psych: appears tired and confused at times, avoidant of discussions  Imaging: I personally reviewed recent imaging.   Assessment & Plan:   Assessment: Patient is a 89 year old gentleman with metastatic prostate cancer to bone, follows with Dr. Al Pimple, has a history of chronic anemia thrombocytopenia hypertension dyslipidemia remote DVT. Palliative medicine team consulted to assist with complex medical decision making.   Recommendations/Plan: # Complex medical decision making/goals of care:  - Attempted conversation with patient and daughter as detailed above in HPI.  Continue to express concern about patient's cancer progression and transfusion dependent state at this time.  Patient is reaching the end of life so tried to explore how he wanted to spend that time.  Patient not willing to engage in this conversation.  Discussed care with daughter, [name], as able.  Daughter notes reluctance he to make a decision on her father's  behalf despite the fact that he appears to have delirium.  Daughter going to attempt discussing hospice focused care with patient if he is willing to engage.  -  Code Status: DNR  # Symptom management:  -As per hospitalist   # Psychosocial Support:  -Daughter-Cynthia, son  # Discharge Planning: To Be Determined  Discussed with: patient, daughterAram Beecham, RN, hospitalist   Thank you for allowing the palliative care team to participate in the care Burton Apley.  Alvester Morin, DO Palliative Care Provider PMT # [phone]  If patient remains symptomatic despite maximum doses, please call PMT at [phone] between 0700 and 1900. Outside of these hours, please call attending, as PMT does not have night coverage.  This provider spent a total of 52 minutes providing patient's care.  Includes review of EMR, discussing care with other staff members involved in patient's medical care, obtaining relevant history and information from patient and/or patient's family, and personal review of imaging and lab work. Greater than 50% of the time was spent counseling and coordinating care related to the above assessment and plan.    *Please note that this is  a verbal dictation therefore any spelling or grammatical errors are due to the "[employer] One" system interpretation.

## 2022-08-28 NOTE — Plan of Care (Signed)

## 2022-08-29 ENCOUNTER — Inpatient Hospital Stay (HOSPITAL_COMMUNITY): Payer: Medicare Other

## 2022-08-29 DIAGNOSIS — D649 Anemia, unspecified: Secondary | ICD-10-CM | POA: Diagnosis not present

## 2022-08-29 LAB — CBC
HCT: 30.7 % — ABNORMAL LOW (ref 39.0–52.0)
Hemoglobin: 9.8 g/dL — ABNORMAL LOW (ref 13.0–17.0)
MCH: 29 pg (ref 26.0–34.0)
MCHC: 31.9 g/dL (ref 30.0–36.0)
MCV: 90.8 fL (ref 80.0–100.0)
Platelets: 16 10*3/uL — CL (ref 150–400)
RBC: 3.38 MIL/uL — ABNORMAL LOW (ref 4.22–5.81)
RDW: 17.8 % — ABNORMAL HIGH (ref 11.5–15.5)
WBC: 6 10*3/uL (ref 4.0–10.5)
nRBC: 19 % — ABNORMAL HIGH (ref 0.0–0.2)

## 2022-08-29 LAB — POTASSIUM: Potassium: 2.8 mmol/L — ABNORMAL LOW (ref 3.5–5.1)

## 2022-08-29 LAB — BPAM PLATELET PHERESIS
Blood Product Expiration Date: 202408052359
ISSUE DATE / TIME: 202408041400
Unit Type and Rh: 5100

## 2022-08-29 LAB — PREPARE PLATELET PHERESIS: Unit division: 0

## 2022-08-29 MED ORDER — SODIUM CHLORIDE 0.9% IV SOLUTION: Freq: Once | INTRAVENOUS | Status: AC

## 2022-08-29 MED ORDER — LOSARTAN POTASSIUM 50 MG PO TABS
50.0000 mg | ORAL_TABLET | Freq: Every day | ORAL | Status: DC
  Filled 2022-08-29: qty 1

## 2022-08-29 MED ORDER — LOSARTAN POTASSIUM 50 MG PO TABS: 50.0000 mg | ORAL_TABLET | Freq: Every day | ORAL | Status: DC

## 2022-08-29 NOTE — Progress Notes (Signed)
PT Cancellation Note  Patient Details Name: [name] MRN: [id number] DOB: [birth year]   Cancelled Treatment:    Reason Eval/Treat Not Completed: Pain limiting ability to participate  PT contacted to work with pt prior to discharge per family request.  Upon checking on pt, pt in pain and daughter preferred for PT not to work with pt at this time.  RN notified of request for pain medication.   Janan Halter Payson 08/29/2022, 4:24 PM Paulino Door, DPT Physical Therapist Acute Rehabilitation Services Office: [phone]

## 2022-08-29 NOTE — Plan of Care (Signed)

## 2022-08-29 NOTE — Progress Notes (Signed)
PROGRESS NOTE    Craig Freeman  NWG:[id number] DOB: 1924/04/27 DOA: 08/17/2022 PCP: Georgann Housekeeper, MD   Brief Narrative: Craig Freeman is a 89 y.o. male with a history of metastatic prostate cancer to bone, chronic anemia, chronic thrombocytopenia, hypertension, hyperlipidemia, DVT.  Patient presented secondary to weakness and fatigue and was found to have evidence of symptomatic anemia requiring multiple blood transfusions. Now plan for home with hospice.   Assessment and Plan:  Acute on chronic anemia Symptomatic anemia Concern for multifactorial etiology related to slow GI bleeding and metastatic disease causing bone marrow replacement.  Patient is currently not on anticoagulation.  Patient with fecal occult positive blood test.  Patient has required a total of 3 units of PRBC via blood transfusion this admission.  GI was consulted and recommended no plan for intervention, including no endoscopy procedure. Patient continues to have recurrent anemia -Continue conservative management -Continue folic acid -Transfuse to keep hemoglobin greater than 8.5 g/dL per oncology recommendation -Plan to discharge home with hospice. TOC consulted.  AKI Present on admission.  Creatinine of 1.59 on admission.  Resolved with IV fluids.  Acute on chronic thrombocytopenia Oncology on board.  Recommendation is for platelet transfusion for platelets less than 20,000. Patient transfused a total of 3 units of platelets this admission. Platelets up to 135,000 after most recent transfusion and downtrending again. Platelets down to 16,000 today. -Transfuse 1 unit of platelets. No post-transfusion CBC.  Possible pneumonia Patient with patchy opacity in left base with elevated temperature without true fever.  Patient started on empiric ceftriaxone and doxycycline and completed a 5-day treatment course.  Metastatic prostate cancer Bone metastasis Patient follows with medical oncology Dr. Al Pimple.   Patient is currently on Zytiga which has been restarted this admission. Discussed with patient and she is interested in having discussions with her father with palliative care present.  Primary hypertension -Continue atenolol -increase to losartan 50 mg daily  Hyperlipidemia -Continue Lipitor  Dehydration Managed with IV fluids.  Oral thrush -Continue nystatin  Hypokalemia Resolved with supplementation.  DVT prophylaxis: SCDs Code Status:   Code Status: DNR Family Communication: Daughter via telephone (8/4) Disposition Plan: Discharge to home with hospice likely in 1 day. Medically stable for discharge.   Consultants:  Medical oncology Gastroenterology  Procedures:    Antimicrobials:     Subjective: Patient reports no concerns this morning.  Objective: BP (!) 170/71 (BP Location: Left Arm)   Pulse 64   Temp 98.3 F (36.8 C) (Oral)   Resp 18   Ht 5\' 4"  (1.626 m)   Wt 59 kg   SpO2 96%   BMI 22.31 kg/m   Examination:  General exam: Appears calm and comfortable Respiratory system: Clear to auscultation. Respiratory effort normal. Cardiovascular system: S1 & S2 heard, RRR. Gastrointestinal system: Abdomen is nondistended, soft and nontender. Normal bowel sounds heard. Central nervous system: Alert and oriented. Musculoskeletal: No edema. No calf tenderness   Data Reviewed: I have personally reviewed following labs and imaging studies  CBC Lab Results  Component Value Date   WBC 7.0 08/28/2022   RBC 2.66 (L) 08/28/2022   HGB 8.1 (L) 08/28/2022   HCT 24.3 (L) 08/28/2022   MCV 91.4 08/28/2022   MCH 30.5 08/28/2022   PLT 33 (L) 08/28/2022   MCHC 33.3 08/28/2022   RDW 17.1 (H) 08/28/2022   LYMPHSABS 1.3 08/19/2022   MONOABS 0.6 08/19/2022   EOSABS 0.1 08/19/2022   BASOSABS 0.0 08/19/2022     Last metabolic  panel Lab Results  Component Value Date   NA 143 08/28/2022   K 2.5 (LL) 08/28/2022   CL 106 08/28/2022   CO2 26 08/28/2022   BUN 22  08/28/2022   CREATININE 0.79 08/28/2022   GLUCOSE 92 08/28/2022   GFRNONAA >60 08/28/2022   GFRAA 71 (L) 06/16/2011   CALCIUM 8.4 (L) 08/28/2022   PROT 5.9 (L) 08/26/2022   ALBUMIN 2.9 (L) 08/26/2022   BILITOT 1.2 08/26/2022   ALKPHOS 62 08/26/2022   AST 74 (H) 08/26/2022   ALT 44 08/26/2022   ANIONGAP 11 08/28/2022    GFR: Estimated Creatinine Clearance: 45.1 mL/min (by C-G formula based on SCr of 0.79 mg/dL).  No results found for this or any previous visit (from the past 240 hour(s)).     Radiology Studies: No results found.    LOS: 11 days    Jacquelin Hawking, MD [facility] 08/29/2022, 9:25 AM   If 7PM-7AM, please contact night-coverage [email or web address]

## 2022-08-29 NOTE — Progress Notes (Signed)
[facility] Liaison Note  Received request from [facility], Jaynie Collins, for hospice services in home after discharge.  Spoke with patient's daughter, Noelle Penner via phone to initiate education on hospice philosophy, services and team approach to care.  Per discussion, tentative plan for discharge tomorrow.  Patient currently has walker and rollater in home.  DME needs deferred by daughter until initial hospice visit so RN can eval home.  Address confirmed as correct in chart.  Please send signed and completed DNR with patient/family at discharge.  Please provide prescriptions at discharge to ensure ongoing symptom management.  [facility] information give to daughter, Noelle Penner.  TOC updated.  Please call with any questions or concerns.  Thank you for the opportunity to participate in this patient's care.  Doreatha Martin, RN [facility] Liaison [phone]

## 2022-08-30 DIAGNOSIS — D649 Anemia, unspecified: Secondary | ICD-10-CM | POA: Diagnosis not present

## 2022-08-30 LAB — MAGNESIUM: Magnesium: 1.5 mg/dL — ABNORMAL LOW (ref 1.7–2.4)

## 2022-08-30 LAB — POTASSIUM: Potassium: 2.9 mmol/L — ABNORMAL LOW (ref 3.5–5.1)

## 2022-08-30 MED ORDER — POTASSIUM CHLORIDE CRYS ER 20 MEQ PO TBCR
40.0000 meq | EXTENDED_RELEASE_TABLET | Freq: Once | ORAL | Status: DC
  Filled 2022-08-30: qty 2

## 2022-08-30 MED ORDER — MAGNESIUM SULFATE 2 GM/50ML IV SOLN
2.0000 g | Freq: Once | INTRAVENOUS | Status: AC
  Administered 2022-08-30: 2 g via INTRAVENOUS
  Filled 2022-08-30: qty 50

## 2022-08-30 MED ORDER — POTASSIUM CHLORIDE 10 MEQ/100ML IV SOLN
10.0000 meq | INTRAVENOUS | Status: AC
  Administered 2022-08-30 (×4): 10 meq via INTRAVENOUS
  Filled 2022-08-30 (×4): qty 100

## 2022-08-30 MED ORDER — FUROSEMIDE 10 MG/ML IJ SOLN
20.0000 mg | Freq: Once | INTRAMUSCULAR | Status: AC
  Administered 2022-08-30: 20 mg via INTRAVENOUS
  Filled 2022-08-30: qty 2

## 2022-08-30 NOTE — Progress Notes (Addendum)
  Daily Progress Note   Patient Name: Craig Freeman       Date: 08/30/2022 DOB: October 20, 1924  Age: 89 y.o. MRN#: [id number] Attending Physician: Narda Bonds, MD Primary Care Physician: Georgann Housekeeper, MD Admit Date: 08/17/2022 Length of Stay: 12 days  Patient last seen by this palliative medicine team provider on 8/3. As per EMR review, daughter discussed going home with [facility] hospice support. [facility] hospice liaison involved to assist with coordination of care. Informed later in day daughter considering best support and location for patient's medical care.  Please reach out if any acute PMT needs develop. Thank you.    Alvester Morin, DO Palliative Care Provider PMT # [phone]

## 2022-08-30 NOTE — Care Management Important Message (Signed)
Important Message  Patient Details No IM Letter given due to [facility] following. Name: Craig Freeman MRN: [id number] Date of Birth: Sep 04, 1924   Medicare Important Message Given:  No     [name] 08/30/2022, 4:00 PM

## 2022-08-30 NOTE — Progress Notes (Signed)
[facility] Liaison Note  [facility] will continue to follow for disposition planning. Please call with any questions or concerns. Thank you  Dionicio Stall, [facility] Liaison [phone]

## 2022-08-30 NOTE — Progress Notes (Signed)
[facility] Liaison Note   [facility] continuing to following for discharge disposition for hospice services at discharge.    Dionicio Stall, [facility] Liaison [phone]

## 2022-08-30 NOTE — TOC Progression Note (Signed)
Transition of Care [facility]) - Progression Note    Patient Details  Name: Craig Freeman MRN: [id number] Date of Birth: Oct 25, 1924  Transition of Care [facility] CM/SW Contact  Beckie Busing, RN Phone [phone]  08/30/2022, 3:41 PM  Clinical Narrative:    TOC following for disposition planning. Awaiting decision about disposition per family.          Expected Discharge Plan and Services                                               Social Determinants of Health (SDOH) Interventions SDOH Screenings   Food Insecurity: No Food Insecurity (08/17/2022)  Housing: Low Risk  (08/17/2022)  Transportation Needs: No Transportation Needs (08/17/2022)  Utilities: Not At Risk (08/17/2022)  Depression (PHQ2-9): Low Risk  (02/12/2022)  Tobacco Use: Low Risk  (08/17/2022)    Readmission Risk Interventions     No data to display

## 2022-08-30 NOTE — Progress Notes (Signed)
Craig Freeman   DOB:[birth year]   YT#:[id number]   TFT#:[id number]  Hem/onc follow up   Subjective: Pt appears to be more drowsy to me today, he had multiple family members in the room, he did not engage at all when I talked to his family members.  Patient and his family has met palliative care Dr. Patterson Hammersmith and hospice liaison over the weekend, they are open to hospice care, but his family is not able to bring him home due to the lack of enough support at home.   Objective:  Vitals:   08/30/22 1124 08/30/22 1323  BP: (!) 145/76 (!) 151/60  Pulse: 73 72  Resp:  18  Temp:  98.3 F (36.8 C)  SpO2:  92%    Body mass index is 22.31 kg/m.  Intake/Output Summary (Last 24 hours) at 08/30/2022 2100 Last data filed at 08/30/2022 1700 Gross per 24 hour  Intake 49.83 ml  Output 2400 ml  Net -2350.17 ml     Sclerae unicteric  Neuro nonfocal    CBG (last 3)  No results for input(s): "GLUCAP" in the last 72 hours.    Labs:   Urine Studies No results for input(s): "UHGB", "CRYS" in the last 72 hours.  Invalid input(s): "UACOL", "UAPR", "USPG", "UPH", "UTP", "UGL", "UKET", "UBIL", "UNIT", "UROB", "ULEU", "UEPI", "UWBC", "URBC", "UBAC", "CAST", "UCOM", "BILUA"  Basic Metabolic Panel: Recent Labs  Lab 08/24/22 0558 08/26/22 0538 08/28/22 0833 08/29/22 1240 08/30/22 0521  NA 137 140 143  --   --   K 3.5 3.1* 2.5* 2.8* 2.9*  CL 107 106 106  --   --   CO2 21* 22 26  --   --   GLUCOSE 96 91 92  --   --   BUN 22 21 22   --   --   CREATININE 0.84 0.91 0.79  --   --   CALCIUM 7.8* 8.1* 8.4*  --   --   MG  --   --   --   --  1.5*   GFR Estimated Creatinine Clearance: 45.1 mL/min (by C-G formula based on SCr of 0.79 mg/dL). Liver Function Tests: Recent Labs  Lab 08/24/22 0558 08/26/22 0538  AST 77* 74*  ALT 46* 44  ALKPHOS 59 62  BILITOT 1.4* 1.2  PROT 5.6* 5.9*  ALBUMIN 2.7* 2.9*   No results for input(s): "LIPASE", "AMYLASE" in the last 168 hours. No results for input(s):  "AMMONIA" in the last 168 hours. Coagulation profile No results for input(s): "INR", "PROTIME" in the last 168 hours.  CBC: Recent Labs  Lab 08/26/22 1915 08/27/22 0528 08/28/22 0833 08/29/22 0924 08/30/22 0521  WBC 6.1 6.9 7.0 6.0 7.1  HGB 8.1* 8.7* 8.1* 9.8* 8.7*  HCT 24.9* 27.1* 24.3* 30.7* 27.2*  MCV 91.9 93.8 91.4 90.8 92.5  PLT 91* 66* 33* 16* 51*   Cardiac Enzymes: No results for input(s): "CKTOTAL", "CKMB", "CKMBINDEX", "TROPONINI" in the last 168 hours. BNP: Invalid input(s): "POCBNP" CBG: No results for input(s): "GLUCAP" in the last 168 hours.  D-Dimer No results for input(s): "DDIMER" in the last 72 hours. Hgb A1c No results for input(s): "HGBA1C" in the last 72 hours. Lipid Profile No results for input(s): "CHOL", "HDL", "LDLCALC", "TRIG", "CHOLHDL", "LDLDIRECT" in the last 72 hours. Thyroid function studies No results for input(s): "TSH", "T4TOTAL", "T3FREE", "THYROIDAB" in the last 72 hours.  Invalid input(s): "FREET3" Anemia work up No results for input(s): "VITAMINB12", "FOLATE", "FERRITIN", "TIBC", "IRON", "RETICCTPCT" in the last 72  hours. Microbiology No results found for this or any previous visit (from the past 240 hour(s)).     Studies:  DG CHEST PORT 1 VIEW  Result Date: 08/29/2022 CLINICAL DATA:  Wheezing. EXAM: PORTABLE CHEST 1 VIEW COMPARISON:  Chest radiograph dated 08/21/2022. FINDINGS: The heart size and mediastinal contours are within normal limits. Vascular calcifications are seen in the aortic arch. There are small bilateral pleural effusions with associated atelectasis/airspace disease, slightly increased compared to 08/21/2022. There is no pneumothorax. Degenerative changes are seen in the spine. IMPRESSION: Small bilateral pleural effusions with associated atelectasis/airspace disease, slightly increased compared to 08/21/2022. Electronically Signed   By: Romona Curls M.D.   On: 08/29/2022 19:54    Assessment: 89 y.o. male    Symptomatic acute on chronic anemia, secondary to bone metastasis from prostate cancer, and GI bleeding.  He also has a mildly low folate, on folic acid now. AKI, resolved Chronic severe thrombocytopenia, secondary to bone metastasis Possible pneumonia Hypertension Generalized weakness and deconditioning    Plan:  -I agree that patient is hospice appropriate, due to lack of cancer treatment options, and his overall rapid decline of his health and functional status. -I spoke with his daughter [name] and rest of family.  They are struggling about the discharge location.  Family is not able to offer 24/7 care at home, but they are not very happy about residential hospice care at [facility] they previously had with other family member.  I discussed option of hospice of [place], they will think about it.  They asked if patient can stay in the hospital until he passed away, which is not idea solution if pt is not actively dying.  -I discuss changing to comfort care and stopping lab and blood transfusion, they will think about it. They want to see how things go in next few days.  -[name] and the rest of family member understand the patient is terminal, there are excepting that but still struggling about where pt can stay until he passes away peacefully. -I offered counseling and emotional support. I will f/u as needed.   Malachy Mood, MD 08/30/2022

## 2022-08-30 NOTE — Plan of Care (Signed)

## 2022-08-30 NOTE — Progress Notes (Addendum)
PROGRESS NOTE    Craig Freeman  WJX:[id number] DOB: Sep 19, 1924 DOA: 08/17/2022 PCP: Georgann Housekeeper, MD   Brief Narrative: Craig Freeman is a 89 y.o. male with a history of metastatic prostate cancer to bone, chronic anemia, chronic thrombocytopenia, hypertension, hyperlipidemia, DVT.  Patient presented secondary to weakness and fatigue and was found to have evidence of symptomatic anemia requiring multiple blood transfusions. Now plan for home with hospice.   Assessment and Plan:  Acute on chronic anemia Symptomatic anemia Concern for multifactorial etiology related to slow GI bleeding and metastatic disease causing bone marrow replacement.  Patient is currently not on anticoagulation.  Patient with fecal occult positive blood test.  Patient has required a total of 3 units of PRBC via blood transfusion this admission.  GI was consulted and recommended no plan for intervention, including no endoscopy procedure. Patient continues to have recurrent anemia -Continue conservative management -Continue folic acid -Transfuse to keep hemoglobin greater than 8.5 g/dL per oncology recommendation -Plan to discharge home with hospice. TOC consulted.  AKI Present on admission.  Creatinine of 1.59 on admission.  Resolved with IV fluids.  Acute on chronic thrombocytopenia Oncology on board.  Recommendation is for platelet transfusion for platelets less than 20,000. Patient transfused a total of 4 units of platelets this admission.  Possible pneumonia Patient with patchy opacity in left base with elevated temperature without true fever.  Patient started on empiric ceftriaxone and doxycycline and completed a 5-day treatment course.  Wheezing Chest x-ray with mild pleural effusion and airspace disease, which is likely related to prior pneumonia. -Lasix IV x1  Metastatic prostate cancer Bone metastasis Patient follows with medical oncology Dr. Al Pimple.  Patient is currently on Zytiga which has  been restarted this admission. Discussed with patient and she is interested in having discussions with her father with palliative care present.  Primary hypertension -Continue atenolol -Continue losartan 50 mg daily  Hyperlipidemia -Continue Lipitor  Dehydration Managed with IV fluids. Resolved.  Oral thrush -Continue nystatin  Hypokalemia Supplementation as needed  DVT prophylaxis: SCDs Code Status:   Code Status: DNR Family Communication: Daughter via telephone (8/5) Disposition Plan: Discharge to home with hospice vs hospice facility. Barrier includes no 24 hour care at home and family hesitancy in sending him to a hospice facility since patient's desire is to be home.   Consultants:  Medical oncology Gastroenterology  Procedures:    Antimicrobials:     Subjective: No issues today per patient. Breathing well.  Objective: BP (!) 142/77 (BP Location: Left Arm)   Pulse 72   Temp 98.9 F (37.2 C) (Oral)   Resp 20   Ht 5\' 4"  (1.626 m)   Wt 59 kg   SpO2 90%   BMI 22.31 kg/m   Examination:  General exam: Appears calm and comfortable. Ill appearing. Appears fatigued. Respiratory system: Clear to auscultation. Mild tachypnea. Cardiovascular system: S1 & S2 heard, RRR. 2/6 systolic murmur Gastrointestinal system: Abdomen is nondistended, soft and non-tender. Normal bowel sounds heard. Central nervous system: Alert.   Data Reviewed: I have personally reviewed following labs and imaging studies  CBC Lab Results  Component Value Date   WBC 7.1 08/30/2022   RBC 2.94 (L) 08/30/2022   HGB 8.7 (L) 08/30/2022   HCT 27.2 (L) 08/30/2022   MCV 92.5 08/30/2022   MCH 29.6 08/30/2022   PLT 51 (L) 08/30/2022   MCHC 32.0 08/30/2022   RDW 17.9 (H) 08/30/2022   LYMPHSABS 1.3 08/19/2022   MONOABS 0.6 08/19/2022  EOSABS 0.1 08/19/2022   BASOSABS 0.0 08/19/2022     Last metabolic panel Lab Results  Component Value Date   NA 143 08/28/2022   K 2.8 (L)  08/29/2022   CL 106 08/28/2022   CO2 26 08/28/2022   BUN 22 08/28/2022   CREATININE 0.79 08/28/2022   GLUCOSE 92 08/28/2022   GFRNONAA >60 08/28/2022   GFRAA 71 (L) 06/16/2011   CALCIUM 8.4 (L) 08/28/2022   PROT 5.9 (L) 08/26/2022   ALBUMIN 2.9 (L) 08/26/2022   BILITOT 1.2 08/26/2022   ALKPHOS 62 08/26/2022   AST 74 (H) 08/26/2022   ALT 44 08/26/2022   ANIONGAP 11 08/28/2022    GFR: Estimated Creatinine Clearance: 45.1 mL/min (by C-G formula based on SCr of 0.79 mg/dL).  No results found for this or any previous visit (from the past 240 hour(s)).     Radiology Studies: DG CHEST PORT 1 VIEW  Result Date: 08/29/2022 CLINICAL DATA:  Wheezing. EXAM: PORTABLE CHEST 1 VIEW COMPARISON:  Chest radiograph dated 08/21/2022. FINDINGS: The heart size and mediastinal contours are within normal limits. Vascular calcifications are seen in the aortic arch. There are small bilateral pleural effusions with associated atelectasis/airspace disease, slightly increased compared to 08/21/2022. There is no pneumothorax. Degenerative changes are seen in the spine. IMPRESSION: Small bilateral pleural effusions with associated atelectasis/airspace disease, slightly increased compared to 08/21/2022. Electronically Signed   By: Romona Curls M.D.   On: 08/29/2022 19:54      LOS: 12 days    Jacquelin Hawking, MD [facility] 08/30/2022, 9:49 AM   If 7PM-7AM, please contact night-coverage [email or web address]

## 2022-08-30 NOTE — Progress Notes (Signed)
Physical Therapy Treatment Patient Details Name: Craig Freeman MRN: [id number] DOB: 10-12-1924 Today's Date: 08/30/2022   History of Present Illness Pt is a 89 yo male presenting to ED on 08/17/2022 due to generalized malaise, poor PO intake, lightheadedness and dizziness and limited ambulation. Pt was found to have low HgB of 6.7 and pt provided with I unit PRBC 7/23 and 7/24. CXR negative for pleural effusion and edema and focal consolidation however lesion to R humeral shaft is new and suspected scattered metastases to ribs.  Pt admitted to hospital ~ 1 month ago with rectal bleeding/GI bleed. Pt PMH includes but is not limited to: metastatic prostate cancer, thrombocytopenia, anemia, HTN, hx of DVT (warfarin), and HLD    PT Comments   Pt admitted with above diagnosis.  Pt currently with functional limitations due to the deficits listed below (see PT Problem List). Pt in bed when PT arrived. Pt resting and easily roused. Pt required increased physical assist for all functional mobility tasks. PT noted no change in mobility restrictions at this time. Pt required mod A for supine to sit, mod A for sit to stand  from elevated EOB, mod A for standing balance and limited with 3 trails < 10s. Max A for sit to supine and repositioning in bed. Pts family arrived s/p pt returned to bed. PT made daughter aware of apparent decline in safe and IND with bed mobility and transfer tasks and inquired per d/c plan with PT ongoing recommendation for continued inpatient follow up therapy, <3 hours/day. Hospice consult indicating home with hospice services. Pt will benefit from acute skilled PT to increase their independence and safety with mobility to allow discharge.      If plan is discharge home, recommend the following: A lot of help with walking and/or transfers;A lot of help with bathing/dressing/bathroom;Assistance with cooking/housework;Direct supervision/assist for medications management;Direct  supervision/assist for financial management;Assist for transportation;Help with stairs or ramp for entrance   Can travel by private vehicle        Equipment Recommendations  None recommended by PT    Recommendations for Other Services       Precautions / Restrictions Precautions Precautions: Fall Restrictions Weight Bearing Restrictions: No     Mobility  Bed Mobility Overal bed mobility: Needs Assistance Bed Mobility: Supine to Sit, Sit to Supine     Supine to sit: HOB elevated, Mod assist Sit to supine: Max assist   General bed mobility comments: Increased assistance needed for bed mobilty    Transfers Overall transfer level: Needs assistance Equipment used: Rolling walker (2 wheels) Transfers: Sit to/from Stand Sit to Stand: Mod assist           General transfer comment: mod A from elevated surface with strong posterior lean and limited WB thorough anterior portion of feet, trunk flexion and mod A to maintain standing at RW with pt demonstrating decreased standing tolerance with 3 trials and < 10s each.    Ambulation/Gait               General Gait Details: NT due to required assist for sit to stand from EOB, standing balance and limited activity tolerance   Stairs             Wheelchair Mobility     Tilt Bed    Modified Rankin (Stroke Patients Only)       Balance Overall balance assessment: Needs assistance Sitting-balance support: Feet supported Sitting balance-Leahy Scale: Fair     Standing balance support:  Bilateral upper extremity supported, During functional activity Standing balance-Leahy Scale: Zero                              Cognition Arousal/Alertness: Lethargic Behavior During Therapy: Flat affect Overall Cognitive Status: Impaired/Different from baseline Area of Impairment: Attention, Following commands, Awareness, Orientation                 Orientation Level: Disoriented to, Time,  Situation Current Attention Level: Sustained   Following Commands: Follows one step commands inconsistently   Awareness: Emergent   General Comments: pt in bed and easily roused, pt able to egage with theraputic activity with increased time for motor processing and planning, pt able to maintain eyes open with cues and limited verbal communication        Exercises      General Comments        Pertinent Vitals/Pain Pain Assessment Pain Assessment: Faces Faces Pain Scale: Hurts little more Breathing: normal Negative Vocalization: none Pain Descriptors / Indicators: Discomfort Pain Intervention(s): Limited activity within patient's tolerance, Monitored during session, Repositioned    Home Living Family/patient expects to be discharged to:: Private residence Living Arrangements: Alone Available Help at Discharge: Family;Available PRN/intermittently Type of Home: House Home Access: Stairs to enter Entrance Stairs-Rails: Can reach both;Left Entrance Stairs-Number of Steps: 4   Home Layout: One level Home Equipment: Agricultural consultant (2 wheels);Cane - single point      Prior Function            PT Goals (current goals can now be found in the care plan section) Acute Rehab PT Goals PT Goal Formulation: Patient unable to participate in goal setting Progress towards PT goals: Not progressing toward goals - comment    Frequency    Min 1X/week      PT Plan Current plan remains appropriate    Co-evaluation              AM-PAC PT "6 Clicks" Mobility   Outcome Measure  Help needed turning from your back to your side while in a flat bed without using bedrails?: A Lot Help needed moving from lying on your back to sitting on the side of a flat bed without using bedrails?: A Lot Help needed moving to and from a bed to a chair (including a wheelchair)?: A Lot Help needed standing up from a chair using your arms (e.g., wheelchair or bedside chair)?: A Lot Help needed  to walk in hospital room?: Total Help needed climbing 3-5 steps with a railing? : Total 6 Click Score: 10    End of Session Equipment Utilized During Treatment: Gait belt Activity Tolerance: Patient limited by fatigue Patient left: in bed;with call bell/phone within reach;with bed alarm set;with family/visitor present Nurse Communication: Mobility status;Other (comment) PT Visit Diagnosis: Unsteadiness on feet (R26.81);Other abnormalities of gait and mobility (R26.89);Muscle weakness (generalized) (M62.81);Difficulty in walking, not elsewhere classified (R26.2)     Time: 1610-9604 PT Time Calculation (min) (ACUTE ONLY): 19 min  Charges:    $Therapeutic Activity: 8-22 mins PT General Charges $$ ACUTE PT VISIT: 1 Visit                     Johnny Bridge, PT Acute Rehab    Jacqualyn Posey 08/30/2022, 4:54 PM

## 2022-08-31 ENCOUNTER — Ambulatory Visit: Payer: Medicare Other

## 2022-08-31 ENCOUNTER — Other Ambulatory Visit: Payer: Medicare Other

## 2022-08-31 ENCOUNTER — Ambulatory Visit: Payer: Medicare Other | Admitting: Nurse Practitioner

## 2022-08-31 ENCOUNTER — Inpatient Hospital Stay: Payer: Medicare Other | Admitting: Adult Health

## 2022-08-31 DIAGNOSIS — Z515 Encounter for palliative care: Secondary | ICD-10-CM

## 2022-08-31 DIAGNOSIS — D649 Anemia, unspecified: Secondary | ICD-10-CM | POA: Diagnosis not present

## 2022-08-31 LAB — CBC
HCT: 27.6 % — ABNORMAL LOW (ref 39.0–52.0)
Hemoglobin: 8.5 g/dL — ABNORMAL LOW (ref 13.0–17.0)
MCH: 28.6 pg (ref 26.0–34.0)
MCHC: 30.8 g/dL (ref 30.0–36.0)
MCV: 92.9 fL (ref 80.0–100.0)
Platelets: 24 10*3/uL — CL (ref 150–400)
RBC: 2.97 MIL/uL — ABNORMAL LOW (ref 4.22–5.81)
RDW: 18.4 % — ABNORMAL HIGH (ref 11.5–15.5)
WBC: 7.2 10*3/uL (ref 4.0–10.5)
nRBC: 13.5 % — ABNORMAL HIGH (ref 0.0–0.2)

## 2022-08-31 LAB — MAGNESIUM: Magnesium: 1.8 mg/dL (ref 1.7–2.4)

## 2022-08-31 LAB — BASIC METABOLIC PANEL
Anion gap: 17 — ABNORMAL HIGH (ref 5–15)
BUN: 22 mg/dL (ref 8–23)
CO2: 25 mmol/L (ref 22–32)
Calcium: 8.3 mg/dL — ABNORMAL LOW (ref 8.9–10.3)
Chloride: 102 mmol/L (ref 98–111)
Creatinine, Ser: 0.94 mg/dL (ref 0.61–1.24)
GFR, Estimated: >60 mL/min (ref >60)
Glucose, Bld: 77 mg/dL (ref 70–99)
Potassium: 2.2 mmol/L — CL (ref 3.5–5.1)
Sodium: 144 mmol/L (ref 135–145)

## 2022-08-31 MED ORDER — POTASSIUM CHLORIDE 10 MEQ/100ML IV SOLN
10.0000 meq | INTRAVENOUS | Status: AC
  Administered 2022-08-31 (×6): 10 meq via INTRAVENOUS
  Filled 2022-08-31 (×6): qty 100

## 2022-08-31 MED ORDER — HYDROMORPHONE HCL 1 MG/ML IJ SOLN
0.5000 mg | INTRAMUSCULAR | Status: DC | PRN
  Administered 2022-09-01 – 2022-09-02 (×3): 0.5 mg via INTRAVENOUS
  Filled 2022-08-31 (×3): qty 0.5

## 2022-08-31 MED ORDER — ACETAMINOPHEN 650 MG RE SUPP
650.0000 mg | Freq: Four times a day (QID) | RECTAL | Status: DC | PRN
  Filled 2022-08-31: qty 1

## 2022-08-31 MED ORDER — HALOPERIDOL LACTATE 2 MG/ML PO CONC: 0.5000 mg | ORAL | Status: DC | PRN

## 2022-08-31 MED ORDER — HALOPERIDOL 0.5 MG PO TABS: 0.5000 mg | ORAL_TABLET | ORAL | Status: DC | PRN

## 2022-08-31 MED ORDER — GLYCOPYRROLATE 0.2 MG/ML IJ SOLN: 0.2000 mg | INTRAMUSCULAR | Status: DC | PRN

## 2022-08-31 MED ORDER — POLYVINYL ALCOHOL 1.4 % OP SOLN: 1.0000 [drp] | Freq: Four times a day (QID) | OPHTHALMIC | Status: DC | PRN

## 2022-08-31 MED ORDER — HALOPERIDOL LACTATE 5 MG/ML IJ SOLN: 0.5000 mg | INTRAMUSCULAR | Status: DC | PRN

## 2022-08-31 MED ORDER — ONDANSETRON 4 MG PO TBDP: 4.0000 mg | ORAL_TABLET | Freq: Four times a day (QID) | ORAL | Status: DC | PRN

## 2022-08-31 MED ORDER — ALBUTEROL SULFATE (2.5 MG/3ML) 0.083% IN NEBU: 2.5000 mg | INHALATION_SOLUTION | RESPIRATORY_TRACT | Status: DC | PRN

## 2022-08-31 MED ORDER — MAGNESIUM SULFATE 2 GM/50ML IV SOLN
2.0000 g | Freq: Once | INTRAVENOUS | Status: AC
  Administered 2022-08-31: 2 g via INTRAVENOUS
  Filled 2022-08-31: qty 50

## 2022-08-31 MED ORDER — ACETAMINOPHEN 325 MG PO TABS: 650.0000 mg | ORAL_TABLET | Freq: Four times a day (QID) | ORAL | Status: DC | PRN

## 2022-08-31 MED ORDER — GLYCOPYRROLATE 1 MG PO TABS: 1.0000 mg | ORAL_TABLET | ORAL | Status: DC | PRN

## 2022-08-31 MED ORDER — BIOTENE DRY MOUTH MT LIQD: 15.0000 mL | OROMUCOSAL | Status: DC | PRN

## 2022-08-31 MED ORDER — ONDANSETRON HCL 4 MG/2ML IJ SOLN: 4.0000 mg | Freq: Four times a day (QID) | INTRAMUSCULAR | Status: DC | PRN

## 2022-08-31 NOTE — Progress Notes (Signed)
PROGRESS NOTE    Craig Freeman  UJW:[id number] DOB: 1925-01-04 DOA: 08/17/2022 PCP: Georgann Housekeeper, MD   Brief Narrative: Craig Freeman is a 89 y.o. male with a history of metastatic prostate cancer to bone, chronic anemia, chronic thrombocytopenia, hypertension, hyperlipidemia, DVT.  Patient presented secondary to weakness and fatigue and was found to have evidence of symptomatic anemia requiring multiple blood transfusions. Now plan for in-hospital death.   Assessment and Plan:  Acute on chronic anemia Symptomatic anemia Concern for multifactorial etiology related to slow GI bleeding and metastatic disease causing bone marrow replacement.  Patient is currently not on anticoagulation.  Patient with fecal occult positive blood test.  Patient has required a total of 3 units of PRBC via blood transfusion this admission.  GI was consulted and recommended no plan for intervention, including no endoscopy procedure. Patient continues to have recurrent anemia. Decision made to transition to full comfort measures. Will stop blood draws and transfusions.  AKI Present on admission.  Creatinine of 1.59 on admission.  Resolved with IV fluids.  Acute on chronic thrombocytopenia Oncology on board.  Recommendation is for platelet transfusion for platelets less than 20,000. Patient transfused a total of 4 units of platelets this admission. Comfort measures.  Possible pneumonia Patient with patchy opacity in left base with elevated temperature without true fever.  Patient started on empiric ceftriaxone and doxycycline and completed a 5-day treatment course.  Wheezing Chest x-ray with mild pleural effusion and airspace disease, which is likely related to prior pneumonia. Improved with Lasix IV x1.  Metastatic prostate cancer Bone metastasis Patient follows with medical oncology Dr. Al Pimple.  Patient is currently on Zytiga which has been restarted this admission. After ongoing goals of care  discussion, decision to transition to full comfort measures. Discussed discontinuing blood draws and blood transfusions. Discussed not starting IV fluids. Will discontinue medications that have no short term benefit. Patient unable to discharge home and family does not want discharge to hospice facility. Patient has shown decline within the last two days, so in-patient death is not an unreasonable outcome. Orders transitioned to comfort measures.  Primary hypertension -Continue atenolol if able -Discontinue losartan 50 mg daily  Hyperlipidemia -Discontinue Lipitor  Dehydration Managed with IV fluids. Resolved.  Oral thrush -Discontinue nystatin  Hypokalemia Supplementation as needed   DVT prophylaxis: SCDs Code Status:   Code Status: DNR Family Communication: Daughter at bedside Disposition Plan: Anticipate in-hospital death. Full comfort measures.   Consultants:  Medical oncology Gastroenterology  Procedures:    Antimicrobials:     Subjective: Patient does not voice concerns.  Objective: BP (!) 170/68 (BP Location: Right Arm)   Pulse 60   Temp 99.4 F (37.4 C) (Oral)   Resp 14   Ht 5\' 4"  (1.626 m)   Wt 59 kg   SpO2 92%   BMI 22.31 kg/m   Examination:  General exam: Appears calm and comfortable. Ill appearing. Respiratory system: Diminished/clear to auscultation. Respiratory effort normal. Cardiovascular system: S1 & S2 heard, RRR. Gastrointestinal system: Abdomen is nondistended, soft and nontender. Normal bowel sounds heard. Central nervous system: Somnolent but arouses somewhat.  Musculoskeletal: No edema. No calf tenderness   Data Reviewed: I have personally reviewed following labs and imaging studies  CBC Lab Results  Component Value Date   WBC 7.2 08/31/2022   RBC 2.97 (L) 08/31/2022   HGB 8.5 (L) 08/31/2022   HCT 27.6 (L) 08/31/2022   MCV 92.9 08/31/2022   MCH 28.6 08/31/2022   PLT  24 (LL) 08/31/2022   MCHC 30.8 08/31/2022   RDW 18.4  (H) 08/31/2022   LYMPHSABS 1.3 08/19/2022   MONOABS 0.6 08/19/2022   EOSABS 0.1 08/19/2022   BASOSABS 0.0 08/19/2022     Last metabolic panel Lab Results  Component Value Date   NA 144 08/31/2022   K 2.2 (LL) 08/31/2022   CL 102 08/31/2022   CO2 25 08/31/2022   BUN 22 08/31/2022   CREATININE 0.94 08/31/2022   GLUCOSE 77 08/31/2022   GFRNONAA >60 08/31/2022   GFRAA 71 (L) 06/16/2011   CALCIUM 8.3 (L) 08/31/2022   PROT 5.9 (L) 08/26/2022   ALBUMIN 2.9 (L) 08/26/2022   BILITOT 1.2 08/26/2022   ALKPHOS 62 08/26/2022   AST 74 (H) 08/26/2022   ALT 44 08/26/2022   ANIONGAP 17 (H) 08/31/2022    GFR: Estimated Creatinine Clearance: 38.4 mL/min (by C-G formula based on SCr of 0.94 mg/dL).  No results found for this or any previous visit (from the past 240 hour(s)).     Radiology Studies: DG CHEST PORT 1 VIEW  Result Date: 08/29/2022 CLINICAL DATA:  Wheezing. EXAM: PORTABLE CHEST 1 VIEW COMPARISON:  Chest radiograph dated 08/21/2022. FINDINGS: The heart size and mediastinal contours are within normal limits. Vascular calcifications are seen in the aortic arch. There are small bilateral pleural effusions with associated atelectasis/airspace disease, slightly increased compared to 08/21/2022. There is no pneumothorax. Degenerative changes are seen in the spine. IMPRESSION: Small bilateral pleural effusions with associated atelectasis/airspace disease, slightly increased compared to 08/21/2022. Electronically Signed   By: Romona Curls M.D.   On: 08/29/2022 19:54      LOS: 13 days    Jacquelin Hawking, MD [facility] 08/31/2022, 9:28 AM   If 7PM-7AM, please contact night-coverage [email or web address]

## 2022-08-31 NOTE — Progress Notes (Signed)
OT Cancellation Note  Patient Details Name: ARSENIO PAO MRN: [id number] DOB: 06/08/1924   Cancelled Treatment:    Reason Eval/Treat Not Completed: Other (comment) Patient to d/c to residential hospice or 24/7 care with hospice services. OT to sign off at this time. Please see las OT note for recommendations.  Rosalio Loud, MS Acute Rehabilitation Department Office# [phone]  08/31/2022, 2:26 PM

## 2022-09-01 DIAGNOSIS — D649 Anemia, unspecified: Secondary | ICD-10-CM | POA: Diagnosis not present

## 2022-09-01 NOTE — Progress Notes (Signed)
PROGRESS NOTE    Craig Freeman  AOZ:[id number] DOB: 1924-03-02 DOA: 08/17/2022 PCP: Georgann Housekeeper, MD   Brief Narrative: Craig Freeman is a 88 y.o. male with a history of metastatic prostate cancer to bone, chronic anemia, chronic thrombocytopenia, hypertension, hyperlipidemia, DVT. Patient presented secondary to weakness and fatigue and was found to have evidence of symptomatic anemia requiring multiple blood transfusions. Now plan for in-hospital death.    Assessment and Plan:  Acute on chronic anemia Symptomatic anemia Concern for multifactorial etiology related to slow GI bleeding and metastatic disease causing bone marrow replacement Pt required a total of 3 units of PRBC via blood transfusion this admission GI was consulted and recommended no plan for intervention, including no endoscopy procedure. Patient continues to have recurrent anemia. Decision made to transition to full comfort measures Patient actively dying  AKI Present on admission.  Creatinine of 1.59 on admission.  Resolved with IV fluids.  Acute on chronic thrombocytopenia Oncology consulted Patient transfused a total of 4 units of platelets this admission. Comfort measures.  Possible pneumonia Patient with patchy opacity in left base with elevated temperature without true fever Patient completed 5 days of empiric ceftriaxone and doxycycline  Metastatic prostate cancer Bone metastasis Patient follows with medical oncology Dr. Al Pimple After ongoing goals of care discussion, decision to transition to full comfort measures Patient unable to discharge home and family does not want discharge to hospice facility Comfort measures, in-hospital death anticipated  Hypertension  Hyperlipidemia  Oral thrush  Hypokalemia    DVT prophylaxis: SCDs Code Status:   Code Status: DNR Family Communication: None at bedside Disposition Plan: Anticipate in-hospital death. Full comfort measures.   Consultants:   Medical oncology Gastroenterology  Procedures:    Antimicrobials:     Subjective: Patient appears to be actively dying, not arousable, noted to be moaning intermittently.  Objective: BP 139/84 (BP Location: Left Arm)   Pulse 82   Temp 99.7 F (37.6 C) (Oral)   Resp (!) 32   Ht 5\' 4"  (1.626 m)   Wt 59 kg   SpO2 94%   BMI 22.31 kg/m   Examination: General: Appears to be actively dying, not arousable  Cardiovascular: S1, S2 present Respiratory: CTAB Abdomen: Soft, nontender, nondistended, bowel sounds present Musculoskeletal: No bilateral pedal edema noted Skin: Normal Psychiatry: Unable to assess    Data Reviewed: I have personally reviewed following labs and imaging studies  CBC Lab Results  Component Value Date   WBC 7.2 08/31/2022   RBC 2.97 (L) 08/31/2022   HGB 8.5 (L) 08/31/2022   HCT 27.6 (L) 08/31/2022   MCV 92.9 08/31/2022   MCH 28.6 08/31/2022   PLT 24 (LL) 08/31/2022   MCHC 30.8 08/31/2022   RDW 18.4 (H) 08/31/2022   LYMPHSABS 1.3 08/19/2022   MONOABS 0.6 08/19/2022   EOSABS 0.1 08/19/2022   BASOSABS 0.0 08/19/2022     Last metabolic panel Lab Results  Component Value Date   NA 144 08/31/2022   K 2.2 (LL) 08/31/2022   CL 102 08/31/2022   CO2 25 08/31/2022   BUN 22 08/31/2022   CREATININE 0.94 08/31/2022   GLUCOSE 77 08/31/2022   GFRNONAA >60 08/31/2022   GFRAA 71 (L) 06/16/2011   CALCIUM 8.3 (L) 08/31/2022   PROT 5.9 (L) 08/26/2022   ALBUMIN 2.9 (L) 08/26/2022   BILITOT 1.2 08/26/2022   ALKPHOS 62 08/26/2022   AST 74 (H) 08/26/2022   ALT 44 08/26/2022   ANIONGAP 17 (H) 08/31/2022  GFR: Estimated Creatinine Clearance: 38.4 mL/min (by C-G formula based on SCr of 0.94 mg/dL).  No results found for this or any previous visit (from the past 240 hour(s)).     Radiology Studies: No results found.    LOS: 14 days    Briant Cedar, MD [facility] 09/01/2022, 5:57 PM   If 7PM-7AM, please contact  night-coverage [email or web address]

## 2022-09-01 NOTE — TOC Progression Note (Signed)
Transition of Care [facility]) - Progression Note    Patient Details  Name: Craig Freeman MRN: [id number] Date of Birth: July 04, 1924  Transition of Care [facility] CM/SW Contact  Beckie Busing, RN Phone Number:[phone]  09/01/2022, 11:18 AM  Clinical Narrative:    CM followed up with MD for disposition plan. Per MD expected hospital death.        Expected Discharge Plan and Services                                               Social Determinants of Health (SDOH) Interventions SDOH Screenings   Food Insecurity: No Food Insecurity (08/17/2022)  Housing: Low Risk  (08/17/2022)  Transportation Needs: No Transportation Needs (08/17/2022)  Utilities: Not At Risk (08/17/2022)  Depression (PHQ2-9): Low Risk  (02/12/2022)  Tobacco Use: Low Risk  (08/17/2022)    Readmission Risk Interventions     No data to display

## 2022-09-01 NOTE — Progress Notes (Signed)
Daily Progress Note   Patient Name: Craig Freeman       Date: 09/01/2022 DOB: 1924/12/01  Age: 89 y.o. MRN#: [id number] Attending Physician: Briant Cedar, MD Primary Care Physician: Georgann Housekeeper, MD Admit Date: 08/17/2022  Reason for Consultation/Follow-up: Terminal Care  Subjective: Appears to be actively dying, does not arouse to voice command, moans and has nonverbal gestures of distress/discomfort at times.  No family at bedside.  Chart reviewed.  Length of Stay: 14  Current Medications: Scheduled Meds:   atenolol  50 mg Oral Daily   mometasone-formoterol  2 puff Inhalation BID   polyethylene glycol  17 g Oral Daily   polyvinyl alcohol  1 drop Both Eyes BID AC & HS   predniSONE  5 mg Oral Q breakfast    Continuous Infusions:   PRN Meds: acetaminophen **OR** acetaminophen, albuterol, antiseptic oral rinse, fluticasone, glycopyrrolate **OR** glycopyrrolate **OR** glycopyrrolate, haloperidol **OR** haloperidol **OR** haloperidol lactate, HYDROmorphone (DILAUDID) injection, lip balm, megestrol, ondansetron **OR** ondansetron (ZOFRAN) IV, mouth rinse, polyvinyl alcohol, senna-docusate, traZODone  Physical Exam         Does appear to be in mild-moderate distress Does have some nonverbal gestures of distress/discomfort-moans, winces and grimaces when his name is called. Shallow breathing Coarse breath sounds, increased secretions Abdomen not distended Does not arouse  Vital Signs: BP 139/84 (BP Location: Left Arm)   Pulse 82   Temp 99.7 F (37.6 C) (Oral)   Resp (!) 32   Ht 5\' 4"  (1.626 m)   Wt 59 kg   SpO2 94%   BMI 22.31 kg/m  SpO2: SpO2: 94 % O2 Device: O2 Device: Nasal Cannula O2 Flow Rate:    Intake/output summary:  Intake/Output Summary (Last 24  hours) at 09/01/2022 1603 Last data filed at 09/01/2022 1000 Gross per 24 hour  Intake 629.59 ml  Output 650 ml  Net -20.41 ml   LBM: Last BM Date : 08/30/22 Baseline Weight: Weight: 59 kg Most recent weight: Weight: 59 kg       Palliative Assessment/Data:      Patient Active Problem List   Diagnosis Date Noted   Palliative care encounter 08/28/2022   Need for emotional support 08/28/2022   Goals of care, counseling/discussion 08/28/2022   Counseling and coordination of care 08/28/2022   DNR (do not resuscitate) 08/28/2022  Anemia 08/18/2022   Symptomatic anemia 08/17/2022   AKI (acute kidney injury) (HCC) 08/17/2022   History of DVT (deep vein thrombosis) 07/16/2022   Transaminitis 07/16/2022   Anemia due to chemotherapy 07/16/2022   Thrombocytopenia (HCC) 07/16/2022   GI bleed 02/07/2022   Rectal bleeding 02/06/2022   Hyperlipidemia 02/06/2022   Prostate cancer metastatic to bone (HCC) 12/30/2021   Ceruminosis, bilateral 09/18/2021   Abscess 06/14/2011   Fever 06/14/2011   Hypertension 06/14/2011   Hypokalemia 06/14/2011   Insomnia 06/14/2011    Palliative Care Assessment & Plan   Patient Profile:    Assessment: Metastatic prostate cancer with bone metastasis, acute kidney injury, possible pneumonia, acute on chronic thrombocytopenia, acute on chronic symptomatic anemia.  Recommendations/Plan: Remains on full comfort measures Anticipated hospital death Medication history reviewed, continue current scope of comfort measures.  Goals of Care and Additional Recommendations: Limitations on Scope of Treatment: Full Comfort Care  Code Status:    Code Status Orders  (From admission, onward)           Start     Ordered   08/31/22 1438  Do not attempt resuscitation (DNR)  Continuous       Question Answer Comment  If patient has no pulse and is not breathing Do Not Attempt Resuscitation   If patient has a pulse and/or is breathing: Medical Treatment Goals  COMFORT MEASURES: Keep clean/warm/dry, use medication by any route; positioning, wound care and other measures to relieve pain/suffering; use oxygen, suction/manual treatment of airway obstruction for comfort; do not transfer unless for comfort needs.   Consent: Discussion documented in EHR or advanced directives reviewed      08/31/22 1438           Code Status History     Date Active Date Inactive Code Status Order ID Comments User Context   08/17/2022 2237 08/31/2022 1438 DNR [id number]  Charlsie Quest, MD ED   07/16/2022 1556 07/18/2022 1912 Full Code [id number]  Alberteen Sam, MD Inpatient   02/06/2022 1410 02/08/2022 2102 Full Code [id number]  Bobette Mo, MD ED   06/14/2011 0431 06/16/2011 2051 Full Code [id number]  Lanney Gins, RN Inpatient       Prognosis:  Hours - Days  Discharge Planning: Anticipated Hospital Death  Care plan was discussed with IDT  Thank you for allowing the Palliative Medicine Team to assist in the care of this patient.  Low MDM.      Greater than 50%  of this time was spent counseling and coordinating care related to the above assessment and plan.  Rosalin Hawking, MD  Please contact Palliative Medicine Team phone at [phone] for questions and concerns.

## 2022-09-02 DIAGNOSIS — D649 Anemia, unspecified: Secondary | ICD-10-CM | POA: Diagnosis not present

## 2022-09-02 NOTE — TOC Progression Note (Signed)
Transition of Care [facility]) - Progression Note    Patient Details  Name: Craig Freeman MRN: [id number] Date of Birth: 1924/09/21  Transition of Care [facility]) CM/SW Contact  Beckie Busing, RN Phone Number:[phone]  09/02/2022, 10:22 AM  Clinical Narrative:    TOC following per MD note , in-hospital death anticipated         Expected Discharge Plan and Services                                               Social Determinants of Health (SDOH) Interventions SDOH Screenings   Food Insecurity: No Food Insecurity (08/17/2022)  Housing: Low Risk  (08/17/2022)  Transportation Needs: No Transportation Needs (08/17/2022)  Utilities: Not At Risk (08/17/2022)  Depression (PHQ2-9): Low Risk  (02/12/2022)  Tobacco Use: Low Risk  (08/17/2022)    Readmission Risk Interventions     No data to display

## 2022-09-02 NOTE — Progress Notes (Signed)
Daily Progress Note   Patient Name: Craig Freeman       Date: 09/02/2022 DOB: 1924/11/04  Age: 89 y.o. MRN#: [id number] Attending Physician: Briant Cedar, MD Primary Care Physician: Georgann Housekeeper, MD Admit Date: 08/17/2022  Reason for Consultation/Follow-up: Terminal Care  Subjective: Appears to be actively dying, does not arouse to voice command, appears comfortable this am, has his hands in a prayer gesture, no distress.   No family at bedside.  Chart reviewed.  Length of Stay: 15  Current Medications: Scheduled Meds:   mometasone-formoterol  2 puff Inhalation BID   polyvinyl alcohol  1 drop Both Eyes BID AC & HS    Continuous Infusions:   PRN Meds: acetaminophen **OR** acetaminophen, albuterol, antiseptic oral rinse, fluticasone, glycopyrrolate **OR** glycopyrrolate **OR** glycopyrrolate, haloperidol **OR** haloperidol **OR** haloperidol lactate, HYDROmorphone (DILAUDID) injection, lip balm, megestrol, ondansetron **OR** ondansetron (ZOFRAN) IV, mouth rinse, polyvinyl alcohol  Physical Exam         no distress no nonverbal gestures of distress/discomfort  Shallow breathing apneic spells Some noisy breathing evident Abdomen not distended Does not arouse  Vital Signs: BP (!) 147/56 (BP Location: Left Arm)   Pulse 81   Temp (!) 102.5 F (39.2 C) (Oral)   Resp 18   Ht 5\' 4"  (1.626 m)   Wt 59 kg   SpO2 (!) 88%   BMI 22.31 kg/m  SpO2: SpO2: (!) 88 % O2 Device: O2 Device: Nasal Cannula O2 Flow Rate: O2 Flow Rate (L/min): 2 L/min  Intake/output summary:  Intake/Output Summary (Last 24 hours) at 09/02/2022 0958 Last data filed at 09/01/2022 1000 Gross per 24 hour  Intake --  Output 50 ml  Net -50 ml   LBM: Last BM Date : 08/31/22 Baseline Weight: Weight: 59  kg Most recent weight: Weight: 59 kg       Palliative Assessment/Data:      Patient Active Problem List   Diagnosis Date Noted   Palliative care encounter 08/28/2022   Need for emotional support 08/28/2022   Goals of care, counseling/discussion 08/28/2022   Counseling and coordination of care 08/28/2022   DNR (do not resuscitate) 08/28/2022   Anemia 08/18/2022   Symptomatic anemia 08/17/2022   AKI (acute kidney injury) (HCC) 08/17/2022   History of DVT (deep vein thrombosis) 07/16/2022   Transaminitis 07/16/2022  Anemia due to chemotherapy 07/16/2022   Thrombocytopenia (HCC) 07/16/2022   GI bleed 02/07/2022   Rectal bleeding 02/06/2022   Hyperlipidemia 02/06/2022   Prostate cancer metastatic to bone (HCC) 12/30/2021   Ceruminosis, bilateral 09/18/2021   Abscess 06/14/2011   Fever 06/14/2011   Hypertension 06/14/2011   Hypokalemia 06/14/2011   Insomnia 06/14/2011    Palliative Care Assessment & Plan   Patient Profile:    Assessment: Metastatic prostate cancer with bone metastasis, acute kidney injury, possible pneumonia, acute on chronic thrombocytopenia, acute on chronic symptomatic anemia.  Recommendations/Plan: Remains on full comfort measures Anticipated hospital death Medication history reviewed, continue current scope of comfort measures.  Goals of Care and Additional Recommendations: Limitations on Scope of Treatment: Full Comfort Care  Code Status:    Code Status Orders  (From admission, onward)           Start     Ordered   08/31/22 1438  Do not attempt resuscitation (DNR)  Continuous       Question Answer Comment  If patient has no pulse and is not breathing Do Not Attempt Resuscitation   If patient has a pulse and/or is breathing: Medical Treatment Goals COMFORT MEASURES: Keep clean/warm/dry, use medication by any route; positioning, wound care and other measures to relieve pain/suffering; use oxygen, suction/manual treatment of airway  obstruction for comfort; do not transfer unless for comfort needs.   Consent: Discussion documented in EHR or advanced directives reviewed      08/31/22 1438           Code Status History     Date Active Date Inactive Code Status Order ID Comments User Context   08/17/2022 2237 08/31/2022 1438 DNR [id number]  Charlsie Quest, MD ED   07/16/2022 1556 07/18/2022 1912 Full Code [id number]  Alberteen Sam, MD Inpatient   02/06/2022 1410 02/08/2022 2102 Full Code [id number]  Bobette Mo, MD ED   06/14/2011 0431 06/16/2011 2051 Full Code [id number]  Lanney Gins, RN Inpatient       Prognosis:  Hours - Days  Discharge Planning: Anticipated Hospital Death  Care plan was discussed with IDT  Thank you for allowing the Palliative Medicine Team to assist in the care of this patient.  Low MDM.      Greater than 50%  of this time was spent counseling and coordinating care related to the above assessment and plan.  Rosalin Hawking, MD  Please contact Palliative Medicine Team phone at [phone] for questions and concerns.

## 2022-09-02 NOTE — Plan of Care (Signed)
  Problem: Pain Managment: Goal: General experience of comfort will improve Outcome: Progressing   Problem: Safety: Goal: Ability to remain free from injury will improve Outcome: Progressing   

## 2022-09-02 NOTE — Progress Notes (Signed)
PROGRESS NOTE    Craig Freeman  VHQ:[id number] DOB: [birth year] DOA: 08/17/2022 PCP: Georgann Housekeeper, MD   Brief Narrative: Craig Freeman is a 89 y.o. male with a history of metastatic prostate cancer to bone, chronic anemia, chronic thrombocytopenia, hypertension, hyperlipidemia, DVT. Patient presented secondary to weakness and fatigue and was found to have evidence of symptomatic anemia requiring multiple blood transfusions. Now plan for in-hospital death.    Assessment and Plan:  Acute on chronic anemia Symptomatic anemia Concern for multifactorial etiology related to slow GI bleeding and metastatic disease causing bone marrow replacement Pt required a total of 3 units of PRBC via blood transfusion this admission GI was consulted and recommended no plan for intervention, including no endoscopy procedure. Patient continues to have recurrent anemia. Decision made to transition to full comfort measures Patient actively dying  AKI Present on admission.  Creatinine of 1.59 on admission.  Resolved with IV fluids.  Acute on chronic thrombocytopenia Oncology consulted Patient transfused a total of 4 units of platelets this admission. Comfort measures.  Possible pneumonia Patient with patchy opacity in left base with elevated temperature without true fever Patient completed 5 days of empiric ceftriaxone and doxycycline  Metastatic prostate cancer Bone metastasis Patient follows with medical oncology Dr. Al Pimple After ongoing goals of care discussion, decision to transition to full comfort measures Patient unable to discharge home and family does not want discharge to hospice facility Comfort measures, in-hospital death anticipated  Hypertension  Hyperlipidemia  Oral thrush  Hypokalemia    DVT prophylaxis: SCDs Code Status:   Code Status: DNR Family Communication: None at bedside Disposition Plan: Anticipate in-hospital death. Full comfort measures.   Consultants:   Medical oncology Gastroenterology  Procedures:    Antimicrobials:     Subjective: Patient not easily arousable. Noted some tachypnea. Pt medicated    Objective: BP (!) 147/56 (BP Location: Left Arm)   Pulse 81   Temp (!) 102.5 F (39.2 C) (Oral)   Resp (!) 26 Comment: Will push dilaudid IV  Ht 5\' 4"  (1.626 m)   Wt 59 kg   SpO2 (!) 88%   BMI 22.31 kg/m   Examination: General: Appears to be actively dying, not arousable  Cardiovascular: S1, S2 present Respiratory: CTAB Abdomen: Soft, nontender, nondistended, bowel sounds present Musculoskeletal: No bilateral pedal edema noted Skin: Normal Psychiatry: Unable to assess    Data Reviewed: I have personally reviewed following labs and imaging studies  CBC Lab Results  Component Value Date   WBC 7.2 08/31/2022   RBC 2.97 (L) 08/31/2022   HGB 8.5 (L) 08/31/2022   HCT 27.6 (L) 08/31/2022   MCV 92.9 08/31/2022   MCH 28.6 08/31/2022   PLT 24 (LL) 08/31/2022   MCHC 30.8 08/31/2022   RDW 18.4 (H) 08/31/2022   LYMPHSABS 1.3 08/19/2022   MONOABS 0.6 08/19/2022   EOSABS 0.1 08/19/2022   BASOSABS 0.0 08/19/2022     Last metabolic panel Lab Results  Component Value Date   NA 144 08/31/2022   K 2.2 (LL) 08/31/2022   CL 102 08/31/2022   CO2 25 08/31/2022   BUN 22 08/31/2022   CREATININE 0.94 08/31/2022   GLUCOSE 77 08/31/2022   GFRNONAA >60 08/31/2022   GFRAA 71 (L) 06/16/2011   CALCIUM 8.3 (L) 08/31/2022   PROT 5.9 (L) 08/26/2022   ALBUMIN 2.9 (L) 08/26/2022   BILITOT 1.2 08/26/2022   ALKPHOS 62 08/26/2022   AST 74 (H) 08/26/2022   ALT 44 08/26/2022   ANIONGAP  17 (H) 08/31/2022    GFR: Estimated Creatinine Clearance: 38.4 mL/min (by C-G formula based on SCr of 0.94 mg/dL).  No results found for this or any previous visit (from the past 240 hour(s)).     Radiology Studies: No results found.    LOS: 15 days    Briant Cedar, MD [facility] 09/02/2022, 3:25 PM   If 7PM-7AM,  please contact night-coverage [email or web address]

## 2022-09-03 DIAGNOSIS — D649 Anemia, unspecified: Secondary | ICD-10-CM | POA: Diagnosis not present

## 2022-09-03 NOTE — Progress Notes (Signed)
Daily Progress Note   Patient Name: Craig Freeman       Date: 09/03/2022 DOB: 1924-12-13  Age: 89 y.o. MRN#: [id number] Attending Physician: Briant Cedar, MD Primary Care Physician: Georgann Housekeeper, MD Admit Date: 08/17/2022  Reason for Consultation/Follow-up: Terminal Care  Subjective: Patient continues in the dying process. no distress.   No family at bedside.  Chart reviewed.  Length of Stay: 16  Current Medications: Scheduled Meds:   mometasone-formoterol  2 puff Inhalation BID   polyvinyl alcohol  1 drop Both Eyes BID AC & HS    Continuous Infusions:   PRN Meds: acetaminophen **OR** acetaminophen, albuterol, antiseptic oral rinse, fluticasone, glycopyrrolate **OR** glycopyrrolate **OR** glycopyrrolate, haloperidol **OR** haloperidol **OR** haloperidol lactate, HYDROmorphone (DILAUDID) injection, lip balm, megestrol, ondansetron **OR** ondansetron (ZOFRAN) IV, mouth rinse, polyvinyl alcohol  Physical Exam         no distress no nonverbal gestures of distress/discomfort  Shallow breathing apneic spells Some noisy breathing evident Abdomen not distended Does not arouse  Vital Signs: BP (!) 113/49 (BP Location: Left Arm)   Pulse 96   Temp 99.5 F (37.5 C) (Oral)   Resp 18   Ht 5\' 4"  (1.626 m)   Wt 59 kg   SpO2 100%   BMI 22.31 kg/m  SpO2: SpO2: 100 % O2 Device: O2 Device: Nasal Cannula O2 Flow Rate: O2 Flow Rate (L/min): 2 L/min  Intake/output summary: No intake or output data in the 24 hours ending 09/03/22 1310  LBM: Last BM Date : 08/31/22 Baseline Weight: Weight: 59 kg Most recent weight: Weight: 59 kg       Palliative Assessment/Data:      Patient Active Problem List   Diagnosis Date Noted   Palliative care encounter 08/28/2022   Need  for emotional support 08/28/2022   Goals of care, counseling/discussion 08/28/2022   Counseling and coordination of care 08/28/2022   DNR (do not resuscitate) 08/28/2022   Anemia 08/18/2022   Symptomatic anemia 08/17/2022   AKI (acute kidney injury) (HCC) 08/17/2022   History of DVT (deep vein thrombosis) 07/16/2022   Transaminitis 07/16/2022   Anemia due to chemotherapy 07/16/2022   Thrombocytopenia (HCC) 07/16/2022   GI bleed 02/07/2022   Rectal bleeding 02/06/2022   Hyperlipidemia 02/06/2022   Prostate cancer metastatic to bone (HCC) 12/30/2021   Ceruminosis,  bilateral 09/18/2021   Abscess 06/14/2011   Fever 06/14/2011   Hypertension 06/14/2011   Hypokalemia 06/14/2011   Insomnia 06/14/2011    Palliative Care Assessment & Plan   Patient Profile:    Assessment: Metastatic prostate cancer with bone metastasis, acute kidney injury, possible pneumonia, acute on chronic thrombocytopenia, acute on chronic symptomatic anemia.  Recommendations/Plan: Remains on full comfort measures Anticipated hospital death Medication history reviewed, continue current scope of comfort measures.  Goals of Care and Additional Recommendations: Limitations on Scope of Treatment: Full Comfort Care  Code Status:    Code Status Orders  (From admission, onward)           Start     Ordered   08/31/22 1438  Do not attempt resuscitation (DNR)  Continuous       Question Answer Comment  If patient has no pulse and is not breathing Do Not Attempt Resuscitation   If patient has a pulse and/or is breathing: Medical Treatment Goals COMFORT MEASURES: Keep clean/warm/dry, use medication by any route; positioning, wound care and other measures to relieve pain/suffering; use oxygen, suction/manual treatment of airway obstruction for comfort; do not transfer unless for comfort needs.   Consent: Discussion documented in EHR or advanced directives reviewed      08/31/22 1438           Code Status  History     Date Active Date Inactive Code Status Order ID Comments User Context   08/17/2022 2237 08/31/2022 1438 DNR [id number]  Charlsie Quest, MD ED   07/16/2022 1556 07/18/2022 1912 Full Code [id number]  Alberteen Sam, MD Inpatient   02/06/2022 1410 02/08/2022 2102 Full Code [id number]  Bobette Mo, MD ED   06/14/2011 0431 06/16/2011 2051 Full Code [id number]  Lanney Gins, RN Inpatient       Prognosis:  Hours - Days  Discharge Planning: Anticipated Hospital Death  Care plan was discussed with IDT  Thank you for allowing the Palliative Medicine Team to assist in the care of this patient.  Low MDM.      Greater than 50%  of this time was spent counseling and coordinating care related to the above assessment and plan.  Rosalin Hawking, MD  Please contact Palliative Medicine Team phone at [phone] for questions and concerns.

## 2022-09-03 NOTE — Progress Notes (Signed)
PROGRESS NOTE    Craig Freeman  NWG:[id number] DOB: [birth year] DOA: 08/17/2022 PCP: Georgann Housekeeper, MD   Brief Narrative: Craig Freeman is a 89 y.o. male with a history of metastatic prostate cancer to bone, chronic anemia, chronic thrombocytopenia, hypertension, hyperlipidemia, DVT. Patient presented secondary to weakness and fatigue and was found to have evidence of symptomatic anemia requiring multiple blood transfusions. Now plan for in-hospital death.    Assessment and Plan:  Acute on chronic anemia Symptomatic anemia Concern for multifactorial etiology related to slow GI bleeding and metastatic disease causing bone marrow replacement Pt required a total of 3 units of PRBC via blood transfusion this admission GI was consulted and recommended no plan for intervention, including no endoscopy procedure. Patient continues to have recurrent anemia. Decision made to transition to full comfort measures Patient actively dying  AKI Present on admission.  Creatinine of 1.59 on admission.  Resolved with IV fluids.  Acute on chronic thrombocytopenia Oncology consulted Patient transfused a total of 4 units of platelets this admission. Comfort measures.  Possible pneumonia Patient with patchy opacity in left base with elevated temperature without true fever Patient completed 5 days of empiric ceftriaxone and doxycycline  Metastatic prostate cancer Bone metastasis Patient follows with medical oncology Dr. Al Pimple After ongoing goals of care discussion, decision to transition to full comfort measures Patient unable to discharge home and family does not want discharge to hospice facility Comfort measures, in-hospital death anticipated  Hypertension  Hyperlipidemia  Oral thrush  Hypokalemia    DVT prophylaxis: SCDs Code Status:   Code Status: DNR Family Communication: None at bedside Disposition Plan: Anticipate in-hospital death. Full comfort measures.   Consultants:   Medical oncology Gastroenterology  Procedures:    Antimicrobials:     Subjective: Patient not easily arousable    Objective: BP (!) 113/49 (BP Location: Left Arm)   Pulse 96   Temp 99.5 F (37.5 C) (Oral)   Resp 18   Ht 5\' 4"  (1.626 m)   Wt 59 kg   SpO2 100%   BMI 22.31 kg/m   Examination: General: Appears to be actively dying, not arousable  Cardiovascular: S1, S2 present Respiratory: CTAB Abdomen: Soft, nontender, nondistended, bowel sounds present Musculoskeletal: No bilateral pedal edema noted Skin: Normal Psychiatry: Unable to assess    Data Reviewed: I have personally reviewed following labs and imaging studies  CBC Lab Results  Component Value Date   WBC 7.2 08/31/2022   RBC 2.97 (L) 08/31/2022   HGB 8.5 (L) 08/31/2022   HCT 27.6 (L) 08/31/2022   MCV 92.9 08/31/2022   MCH 28.6 08/31/2022   PLT 24 (LL) 08/31/2022   MCHC 30.8 08/31/2022   RDW 18.4 (H) 08/31/2022   LYMPHSABS 1.3 08/19/2022   MONOABS 0.6 08/19/2022   EOSABS 0.1 08/19/2022   BASOSABS 0.0 08/19/2022     Last metabolic panel Lab Results  Component Value Date   NA 144 08/31/2022   K 2.2 (LL) 08/31/2022   CL 102 08/31/2022   CO2 25 08/31/2022   BUN 22 08/31/2022   CREATININE 0.94 08/31/2022   GLUCOSE 77 08/31/2022   GFRNONAA >60 08/31/2022   GFRAA 71 (L) 06/16/2011   CALCIUM 8.3 (L) 08/31/2022   PROT 5.9 (L) 08/26/2022   ALBUMIN 2.9 (L) 08/26/2022   BILITOT 1.2 08/26/2022   ALKPHOS 62 08/26/2022   AST 74 (H) 08/26/2022   ALT 44 08/26/2022   ANIONGAP 17 (H) 08/31/2022    GFR: Estimated Creatinine Clearance: 38.4 mL/min (  by C-G formula based on SCr of 0.94 mg/dL).  No results found for this or any previous visit (from the past 240 hour(s)).     Radiology Studies: No results found.    LOS: 16 days    Briant Cedar, MD [facility] 09/03/2022, 4:50 PM   If 7PM-7AM, please contact night-coverage [email or web address]

## 2022-09-08 ENCOUNTER — Ambulatory Visit: Payer: Medicare Other

## 2022-09-08 ENCOUNTER — Other Ambulatory Visit: Payer: Medicare Other

## 2022-09-08 ENCOUNTER — Ambulatory Visit: Payer: Medicare Other | Admitting: Nurse Practitioner

## 2022-09-16 ENCOUNTER — Other Ambulatory Visit (HOSPITAL_COMMUNITY): Payer: Self-pay

## 2022-09-26 NOTE — Progress Notes (Signed)
Patient expired at 625 am verified with Susa Loffler, RN. Anthoney Harada, NP notified. Noelle Penner, daughter notified. [employer] contacted, patient in released.

## 2022-09-26 NOTE — Progress Notes (Signed)
    Patient Name: Craig Freeman           DOB: March 02, 1924  MRN: [id number]       OVERNIGHT EVENT    Notified by RN that patient has expired at 0625.  2 RN verified. Patient was comfort care.   Family has been notified by RN.   Anthoney Harada, DNP, ACNPC- AG [facility]

## 2022-09-26 NOTE — Death Summary Note (Signed)
DEATH SUMMARY   Patient Details  Name: Craig Freeman MRN: [id number] DOB: 10-13-1924 [id number], [name], MD Admission/Discharge Information   Admit Date:  09/11/2022  Date of Death: Date of Death: 09-29-22  Time of Death: Time of Death: 0625  Length of Stay: 10-06-22   Principle Cause of death: Metastatic prostate cancer to bone   Hospital Diagnoses: Principal Problem:   Symptomatic anemia Active Problems:   AKI (acute kidney injury) (HCC)   Hypertension   Prostate cancer metastatic to bone (HCC)   Hyperlipidemia   Thrombocytopenia (HCC)   Anemia   Palliative care encounter   Need for emotional support   Goals of care, counseling/discussion   Counseling and coordination of care   DNR (do not resuscitate)   Hospital Course: Craig Freeman is a 89 y.o. male with a history of metastatic prostate cancer to bone, chronic anemia, chronic thrombocytopenia, hypertension, hyperlipidemia, DVT. Patient presented secondary to weakness and fatigue and was found to have evidence of symptomatic anemia requiring multiple blood transfusions.  Due to very poor prognosis, decision was made to transition patient to full comfort measures on 09-25-2022.  Patient passed away on 2022-09-29   Assessment and Plan:  Acute on chronic anemia Symptomatic anemia Concern for multifactorial etiology related to slow GI bleeding and metastatic disease causing bone marrow replacement Pt required a total of 3 units of PRBC via blood transfusion this admission GI was consulted and recommended no plan for intervention, including no endoscopy procedure Patient continues to have recurrent anemia Decision made to transition to full comfort measures on 8/6   Acute on chronic thrombocytopenia Oncology consulted, signed off Patient transfused a total of 4 units of platelets this admission   Possible pneumonia Patient with patchy opacity in left base with elevated temperature without true fever Patient completed 5 days of  empiric ceftriaxone and doxycycline   Metastatic prostate cancer Bone metastasis Patient followed with medical oncology Dr. Al Pimple After ongoing goals of care discussion, decision to transition to full comfort measures Patient unable to discharge home and family does not want discharge to hospice facility Comfort measures  AKI   Hypertension   Hyperlipidemia   Oral thrush   Hypokalemia       Procedures: None  Consultations: GI, oncology  The results of significant diagnostics from this hospitalization (including imaging, microbiology, ancillary and laboratory) are listed below for reference.   Significant Diagnostic Studies: DG CHEST PORT 1 VIEW  Result Date: 08/29/2022 CLINICAL DATA:  Wheezing. EXAM: PORTABLE CHEST 1 VIEW COMPARISON:  Chest radiograph dated 08/21/2022. FINDINGS: The heart size and mediastinal contours are within normal limits. Vascular calcifications are seen in the aortic arch. There are small bilateral pleural effusions with associated atelectasis/airspace disease, slightly increased compared to 08/21/2022. There is no pneumothorax. Degenerative changes are seen in the spine. IMPRESSION: Small bilateral pleural effusions with associated atelectasis/airspace disease, slightly increased compared to 08/21/2022. Electronically Signed   By: Romona Curls M.D.   On: 08/29/2022 19:54   DG CHEST PORT 1 VIEW  Result Date: 08/21/2022 CLINICAL DATA:  Pneumonia EXAM: PORTABLE CHEST 1 VIEW COMPARISON:  September 11, 2022, PET CT 07/21/2022 FINDINGS: Low lung volumes. Patchy opacity left lung base. No pleural effusion. Stable cardiomediastinal silhouette with aortic atherosclerosis. No pneumothorax. Calcified loose bodies inferior right shoulder. High-riding humeral heads consistent with rotator cuff disease. Known skeletal metastatic disease is better seen previous PET CT. IMPRESSION: Low lung volumes with patchy opacity at the left base which may reflect atelectasis or pneumonia.  Electronically Signed   By: Jasmine Pang M.D.   On: 08/21/2022 18:02   DG Chest Portable 1 View  Result Date: 08/17/2022 CLINICAL DATA:  Productive cough with loss of appetite. Additional history of prostate cancer with bone metastases EXAM: PORTABLE CHEST 1 VIEW COMPARISON:  Chest CT with contrast 10/14/2020 FINDINGS: The heart size and mediastinal contours are within normal limits. There is patchy aortic calcific plaque. Both lungs are clear of infiltrates with mild chronic interstitial changes in the bases. On the right there are calcified axillary lymph nodes, acromiohumeral abutment consistent with chronic rotator cuff arthropathy with a likely degenerative tear, and a sclerotic lesion in the proximal humeral shaft not seen previously and probably a metastasis. There are suspected small scattered sclerotic metastases in the ribs. The vertebra are not well seen. There is mild dextroscoliosis with advanced thoracic spondylosis. IMPRESSION: 1. No evidence of acute chest disease.  Chronic change in the bases. 2. Aortic atherosclerosis. 3. Sclerotic lesion in the proximal right humeral shaft not seen previously and probably a metastasis, with suspected few small scattered sclerotic metastases in the ribs. Electronically Signed   By: Almira Bar M.D.   On: 08/17/2022 20:20    Microbiology: No results found for this or any previous visit (from the past 240 hour(s)).  Time spent: 30 minutes  Signed: Briant Cedar, MD 09/14/2022

## 2022-09-26 DEATH — deceased
